# Patient Record
Sex: Male | Born: 1955 | Race: White | Hispanic: No | Marital: Married | Smoking: Never smoker
Health system: Southern US, Community
[De-identification: ages and names within clinical notes are randomized; demographics above are authoritative.]

## PROBLEM LIST (undated history)

## (undated) DIAGNOSIS — B269 Mumps without complication: Secondary | ICD-10-CM

## (undated) DIAGNOSIS — I499 Cardiac arrhythmia, unspecified: Secondary | ICD-10-CM

## (undated) DIAGNOSIS — Q248 Other specified congenital malformations of heart: Secondary | ICD-10-CM

## (undated) DIAGNOSIS — K219 Gastro-esophageal reflux disease without esophagitis: Secondary | ICD-10-CM

## (undated) DIAGNOSIS — T7840XA Allergy, unspecified, initial encounter: Secondary | ICD-10-CM

## (undated) DIAGNOSIS — I471 Supraventricular tachycardia, unspecified: Secondary | ICD-10-CM

## (undated) DIAGNOSIS — K5730 Diverticulosis of large intestine without perforation or abscess without bleeding: Secondary | ICD-10-CM

## (undated) DIAGNOSIS — J309 Allergic rhinitis, unspecified: Secondary | ICD-10-CM

## (undated) DIAGNOSIS — B059 Measles without complication: Secondary | ICD-10-CM

## (undated) HISTORY — DX: Other specified congenital malformations of heart: Q24.8

## (undated) HISTORY — DX: Supraventricular tachycardia, unspecified: I47.10

## (undated) HISTORY — DX: Allergy, unspecified, initial encounter: T78.40XA

## (undated) HISTORY — PX: OTHER SURGICAL HISTORY: SHX169

## (undated) HISTORY — PX: TONSILLECTOMY: SUR1361

## (undated) HISTORY — DX: Cardiac arrhythmia, unspecified: I49.9

## (undated) HISTORY — PX: HERNIA REPAIR: SHX51

## (undated) HISTORY — DX: Mumps without complication: B26.9

## (undated) HISTORY — DX: Diverticulosis of large intestine without perforation or abscess without bleeding: K57.30

## (undated) HISTORY — DX: Measles without complication: B05.9

## (undated) HISTORY — DX: Allergic rhinitis, unspecified: J30.9

## (undated) HISTORY — DX: Supraventricular tachycardia: I47.1

## (undated) HISTORY — DX: Gastro-esophageal reflux disease without esophagitis: K21.9

---

## 2004-05-01 HISTORY — PX: MITRAL VALVE REPAIR: SHX2039

## 2004-05-01 HISTORY — PX: CARDIAC CATHETERIZATION: SHX172

## 2012-06-03 ENCOUNTER — Encounter: Payer: Self-pay | Admitting: Cardiovascular Disease

## 2012-06-03 ENCOUNTER — Ambulatory Visit: Payer: Self-pay | Admitting: Cardiovascular Disease

## 2012-06-03 ENCOUNTER — Ambulatory Visit (INDEPENDENT_AMBULATORY_CARE_PROVIDER_SITE_OTHER): Payer: 59 | Admitting: Cardiovascular Disease

## 2012-06-03 VITALS — BP 122/86 | HR 77 | Ht 70.0 in | Wt 192.5 lb

## 2012-06-03 DIAGNOSIS — R0602 Shortness of breath: Secondary | ICD-10-CM

## 2012-06-03 DIAGNOSIS — R002 Palpitations: Secondary | ICD-10-CM | POA: Insufficient documentation

## 2012-06-03 DIAGNOSIS — Z9889 Other specified postprocedural states: Secondary | ICD-10-CM

## 2012-06-03 DIAGNOSIS — R42 Dizziness and giddiness: Secondary | ICD-10-CM

## 2012-06-03 MED ORDER — FLECAINIDE ACETATE 50 MG PO TABS: 50.0000 mg | ORAL_TABLET | Freq: Two times a day (BID) | ORAL | Status: DC

## 2012-06-03 NOTE — Progress Notes (Signed)
Patient ID: Austin Stewart, male    DOB: November 30, 1955, 57 y.o.   MRN: [id number]  HPI Comments: Austin Stewart is a very 57 year old who works at the [employer], patient of Dr. Juanetta Gosling, history of significant mitral valve regurgitation (torn leaflet? By his report) with associated shortness of breath, mitral valve repair in 2006 at [facility] with maze procedure at that time, shoulder surgery March 2013 canceled secondary to arrhythmia, likely PACs with followup echocardiogram and stress test showing no ischemia by report, shoulder surgery April 2013 who presents to establish care.  He reports that he is moved to the area in October 2013. He overall feels well. He was very active at baseline prior to moving here and was exercising on a frequent basis. More recently, has had significant weight gain and not exercising. He does report having some fluttering in his chest at times, approximately every other day. This comes on as several extra beats and does not last for long. He reports having echocardiogram in 2013 which showed well functioning mitral valve. He also had a Holter monitor in 2013. He does not know what that showed.   He reports her cholesterol has not been a problem. No significant family history of coronary artery disease. Nonsmoker. Know coronary artery disease seen on previous cardiac catheterization in 2006 He was started on flecainide 50 mg twice a day for ectopy in 2013.  EKG shows normal sinus rhythm with rate 77 beats per minute, no significant ST or T wave changes   Outpatient Encounter Prescriptions as of 06/03/2012  Medication Sig Dispense Refill  . aspirin 81 MG tablet Take 81 mg by mouth daily.      . flecainide (TAMBOCOR) 50 MG tablet Take 1 tablet (50 mg total) by mouth 2 (two) times daily.  60 tablet  11  . fluticasone (FLONASE) 50 MCG/ACT nasal spray Place 2 sprays into the nose daily.      . Glucosamine-Chondroit-Vit C-Mn (GLUCOSAMINE CHONDR 500 COMPLEX PO) Take 500  mg by mouth daily.      Marland Kitchen loratadine (CLARITIN) 10 MG tablet Take 10 mg by mouth daily.      . montelukast (SINGULAIR) 10 MG tablet Take 10 mg by mouth at bedtime.      Marland Kitchen omeprazole (PRILOSEC) 20 MG capsule Take 20 mg by mouth daily.      . Tamsulosin HCl (FLOMAX) 0.4 MG CAPS Take by mouth.      . [DISCONTINUED] flecainide (TAMBOCOR) 50 MG tablet Take 50 mg by mouth 2 (two) times daily.         Review of Systems  Constitutional: Negative.   HENT: Negative.   Eyes: Negative.   Respiratory: Negative.   Cardiovascular: Positive for palpitations.  Gastrointestinal: Negative.   Musculoskeletal: Negative.   Skin: Negative.   Neurological: Negative.   Hematological: Negative.   Psychiatric/Behavioral: Negative.   All other systems reviewed and are negative.     BP 122/86  Pulse 77  Ht 5\' 10"  (1.778 m)  Wt 192 lb 8 oz (87.317 kg)  BMI 27.62 kg/m2  Physical Exam  Nursing note and vitals reviewed. Constitutional: He is oriented to person, place, and time. He appears well-developed and well-nourished.  HENT:  Head: Normocephalic.  Nose: Nose normal.  Mouth/Throat: Oropharynx is clear and moist.  Eyes: Conjunctivae normal are normal. Pupils are equal, round, and reactive to light.  Neck: Normal range of motion. Neck supple. No JVD present.  Cardiovascular: Normal rate, regular rhythm, S1 normal, S2  normal, normal heart sounds and intact distal pulses.  Exam reveals no gallop and no friction rub.   No murmur heard. Pulmonary/Chest: Effort normal and breath sounds normal. No respiratory distress. He has no wheezes. He has no rales. He exhibits no tenderness.  Abdominal: Soft. Bowel sounds are normal. He exhibits no distension. There is no tenderness.  Musculoskeletal: Normal range of motion. He exhibits no edema and no tenderness.  Lymphadenopathy:    He has no cervical adenopathy.  Neurological: He is alert and oriented to person, place, and time. Coordination normal.  Skin: Skin  is warm and dry. No rash noted. No erythema.  Psychiatric: He has a normal mood and affect. His behavior is normal. Judgment and thought content normal.           Assessment and Plan

## 2012-06-03 NOTE — Assessment & Plan Note (Signed)
He reports having episodes where his head will drop it he quickly recovers, usually when in a sitting position such as at church. Denies palpitations during these episodes. He is usually not tired when this happens. Etiology is uncertain. Could certainly do a Holter monitor if symptoms continue to happen or get worse.

## 2012-06-03 NOTE — Assessment & Plan Note (Signed)
History of repair in 2006 with maze procedure. Recent echocardiogram in 2013

## 2012-06-03 NOTE — Patient Instructions (Addendum)
You are doing well. No medication changes were made.  Please call us if you have new issues that need to be addressed before your next appt.  Your physician wants you to follow-up in: 12 months.  You will receive a reminder letter in the mail two months in advance. If you don't receive a letter, please call our office to schedule the follow-up appointment. 

## 2012-06-03 NOTE — Assessment & Plan Note (Signed)
By symptoms, it sounds like he is having APCs or PVCs. Less likely atrial fibrillation. He was started previously on flecainide for ectopy. Symptoms are mild, usually at rest. He did suggest he could take extra flecainide as needed for worsening symptoms. Other option would be to start low-dose beta blocker such as metoprolol tartrate. He will call us if he would like to add additional medication such as beta blocker. He will also call if he would like to repeat his Holter. We'll try to obtain the results from 2013.

## 2012-07-12 ENCOUNTER — Other Ambulatory Visit: Payer: Self-pay

## 2012-07-12 MED ORDER — FLECAINIDE ACETATE 50 MG PO TABS: 50.0000 mg | ORAL_TABLET | Freq: Two times a day (BID) | ORAL | Status: DC

## 2012-07-12 NOTE — Telephone Encounter (Signed)
Refilled Flecainide sent to [facility].

## 2012-11-19 ENCOUNTER — Other Ambulatory Visit: Payer: Self-pay | Admitting: Cardiovascular Disease

## 2012-11-20 ENCOUNTER — Other Ambulatory Visit: Payer: Self-pay | Admitting: *Deleted

## 2012-11-20 MED ORDER — FLECAINIDE ACETATE 50 MG PO TABS: 50.0000 mg | ORAL_TABLET | Freq: Two times a day (BID) | ORAL | Status: DC

## 2012-11-20 NOTE — Telephone Encounter (Signed)
Refilled Flecainide sent to [facility].

## 2013-03-21 ENCOUNTER — Other Ambulatory Visit: Payer: Self-pay

## 2013-03-21 MED ORDER — FLECAINIDE ACETATE 50 MG PO TABS: 50.0000 mg | ORAL_TABLET | Freq: Two times a day (BID) | ORAL | Status: DC

## 2013-03-21 NOTE — Telephone Encounter (Signed)
Refill sent for flecainide 50 mg take one tablet twice a day.

## 2013-06-09 ENCOUNTER — Encounter: Payer: Self-pay | Admitting: Cardiovascular Disease

## 2013-06-09 ENCOUNTER — Ambulatory Visit (INDEPENDENT_AMBULATORY_CARE_PROVIDER_SITE_OTHER): Payer: 59 | Admitting: Cardiovascular Disease

## 2013-06-09 VITALS — BP 130/90 | HR 75 | Ht 70.0 in | Wt 196.8 lb

## 2013-06-09 DIAGNOSIS — Z9889 Other specified postprocedural states: Secondary | ICD-10-CM

## 2013-06-09 DIAGNOSIS — R079 Chest pain, unspecified: Secondary | ICD-10-CM

## 2013-06-09 DIAGNOSIS — R0602 Shortness of breath: Secondary | ICD-10-CM | POA: Insufficient documentation

## 2013-06-09 DIAGNOSIS — R002 Palpitations: Secondary | ICD-10-CM

## 2013-06-09 MED ORDER — FLECAINIDE ACETATE 50 MG PO TABS: 50.0000 mg | ORAL_TABLET | Freq: Two times a day (BID) | ORAL | Status: DC

## 2013-06-09 MED ORDER — FUROSEMIDE 20 MG PO TABS: 20.0000 mg | ORAL_TABLET | Freq: Every day | ORAL | Status: DC | PRN

## 2013-06-09 NOTE — Patient Instructions (Addendum)
You are doing well. Please take lasix as needed for shortness of breath or weight gain Take with potassium  Please call us if you have new issues that need to be addressed before your next appt.  Your physician wants you to follow-up in: 12 months.  You will receive a reminder letter in the mail two months in advance. If you don't receive a letter, please call our office to schedule the follow-up appointment.

## 2013-06-09 NOTE — Assessment & Plan Note (Signed)
No clear etiology of his shortness of breath. Symptoms seem to come on at rest, not with exertion. Unable to exclude mild fluid overload, diastolic dysfunction/CHF. Suggested he could try Lasix when necessary for symptoms. If symptoms get worse, suggested he call our office. Echocardiogram could be repeated to evaluate his mitral valve

## 2013-06-09 NOTE — Assessment & Plan Note (Signed)
Clinical exam is essentially benign. No clinical signs of heart failure despite mild shortness of breath symptoms. Recent echocardiogram 2013 was essentially normal.

## 2013-06-09 NOTE — Progress Notes (Signed)
Patient ID: Austin Stewart, male    DOB: 1955-05-06, 58 y.o.   MRN: [id number]  HPI Comments: Austin Stewart is a very 58 year old who works at the [employer], patient of Dr. Juanetta Gosling, history of significant mitral valve regurgitation (torn leaflet? By his report) with associated shortness of breath, mitral valve repair in 2006 at [facility] with maze procedure at that time, shoulder surgery March 2013 canceled secondary to arrhythmia, likely PACs  with followup echocardiogram and stress test showing no ischemia by report, shoulder surgery April 2013 who presents for routine followup. He is taking flecainide presumably for ectopy.  In general he reports that he is doing well. He does report having mild increasing shortness of breath. Weight has been trending higher, he is not exercising. Denies any lower extremity edema, abdominal swelling, no PND or orthopnea. Symptoms of shortness of breath seem to happen when he is still such as sitting. Able to climb 5 flights of stairs without significant shortness of breath at work.  echocardiogram in 2013 which showed well functioning mitral valve.   Holter monitor in 2013. He does not know what that showed.    cholesterol has not been a problem for the patient. No significant family history of coronary artery disease. Nonsmoker. no coronary artery disease seen on previous cardiac catheterization in 2006 He was started on flecainide 50 mg twice a day for ectopy in 2013.  EKG shows normal sinus rhythm with rate 75 beats per minute, no significant ST or T wave changes   Outpatient Encounter Prescriptions as of 06/09/2013  Medication Sig  . aspirin 81 MG tablet Take 81 mg by mouth daily.  . flecainide (TAMBOCOR) 50 MG tablet Take 1 tablet (50 mg total) by mouth 2 (two) times daily.  . fluticasone (FLONASE) 50 MCG/ACT nasal spray Place 2 sprays into the nose daily.  . Glucosamine-Chondroit-Vit C-Mn (GLUCOSAMINE CHONDR 500 COMPLEX PO) Take 500 mg by  mouth daily.  Marland Kitchen loratadine (CLARITIN) 10 MG tablet Take 10 mg by mouth daily.  . montelukast (SINGULAIR) 10 MG tablet Take 10 mg by mouth at bedtime.  Marland Kitchen omeprazole (PRILOSEC) 20 MG capsule Take 20 mg by mouth daily.  . Tamsulosin HCl (FLOMAX) 0.4 MG CAPS Take by mouth.     Review of Systems  Constitutional: Negative.   HENT: Negative.   Eyes: Negative.   Respiratory: Positive for shortness of breath.   Cardiovascular: Negative.   Gastrointestinal: Negative.   Endocrine: Negative.   Musculoskeletal: Negative.   Skin: Negative.   Allergic/Immunologic: Negative.   Neurological: Negative.   Hematological: Negative.   Psychiatric/Behavioral: Negative.   All other systems reviewed and are negative.    BP 130/90  Pulse 75  Ht 5\' 10"  (1.778 m)  Wt 196 lb 12 oz (89.245 kg)  BMI 28.23 kg/m2  Physical Exam  Nursing note and vitals reviewed. Constitutional: He is oriented to person, place, and time. He appears well-developed and well-nourished.  HENT:  Head: Normocephalic.  Nose: Nose normal.  Mouth/Throat: Oropharynx is clear and moist.  Eyes: Conjunctivae are normal. Pupils are equal, round, and reactive to light.  Neck: Normal range of motion. Neck supple. No JVD present.  Cardiovascular: Normal rate, regular rhythm, S1 normal, S2 normal, normal heart sounds and intact distal pulses.  Exam reveals no gallop and no friction rub.   No murmur heard. Pulmonary/Chest: Effort normal and breath sounds normal. No respiratory distress. He has no wheezes. He has no rales. He exhibits no tenderness.  Abdominal:  Soft. Bowel sounds are normal. He exhibits no distension. There is no tenderness.  Musculoskeletal: Normal range of motion. He exhibits no edema and no tenderness.  Lymphadenopathy:    He has no cervical adenopathy.  Neurological: He is alert and oriented to person, place, and time. Coordination normal.  Skin: Skin is warm and dry. No rash noted. No erythema.  Psychiatric: He has  a normal mood and affect. His behavior is normal. Judgment and thought content normal.      Assessment and Plan

## 2013-06-24 ENCOUNTER — Other Ambulatory Visit: Payer: Self-pay | Admitting: Cardiovascular Disease

## 2013-06-27 ENCOUNTER — Other Ambulatory Visit: Payer: Self-pay | Admitting: Cardiovascular Disease

## 2014-02-03 ENCOUNTER — Encounter: Payer: Self-pay | Admitting: Cardiovascular Disease

## 2014-02-04 ENCOUNTER — Other Ambulatory Visit: Payer: Self-pay

## 2014-02-04 MED ORDER — OMEPRAZOLE 20 MG PO CPDR: 20.0000 mg | DELAYED_RELEASE_CAPSULE | Freq: Every day | ORAL | Status: DC

## 2014-02-04 MED ORDER — FUROSEMIDE 20 MG PO TABS: 20.0000 mg | ORAL_TABLET | Freq: Every day | ORAL | Status: DC | PRN

## 2014-02-25 ENCOUNTER — Ambulatory Visit: Payer: Self-pay | Admitting: Physician Assistant

## 2014-05-01 LAB — HM DIABETES EYE EXAM

## 2014-06-09 ENCOUNTER — Encounter: Payer: Self-pay | Admitting: Cardiovascular Disease

## 2014-06-09 ENCOUNTER — Ambulatory Visit (INDEPENDENT_AMBULATORY_CARE_PROVIDER_SITE_OTHER): Payer: 59 | Admitting: Cardiovascular Disease

## 2014-06-09 VITALS — BP 120/72 | HR 76 | Ht 70.0 in | Wt 189.0 lb

## 2014-06-09 DIAGNOSIS — R002 Palpitations: Secondary | ICD-10-CM

## 2014-06-09 DIAGNOSIS — R0789 Other chest pain: Secondary | ICD-10-CM

## 2014-06-09 DIAGNOSIS — Z9889 Other specified postprocedural states: Secondary | ICD-10-CM

## 2014-06-09 DIAGNOSIS — I5032 Chronic diastolic (congestive) heart failure: Secondary | ICD-10-CM

## 2014-06-09 DIAGNOSIS — I5030 Unspecified diastolic (congestive) heart failure: Secondary | ICD-10-CM | POA: Insufficient documentation

## 2014-06-09 DIAGNOSIS — R0602 Shortness of breath: Secondary | ICD-10-CM

## 2014-06-09 MED ORDER — FLECAINIDE ACETATE 50 MG PO TABS: 50.0000 mg | ORAL_TABLET | Freq: Two times a day (BID) | ORAL | Status: DC

## 2014-06-09 MED ORDER — FUROSEMIDE 20 MG PO TABS: 20.0000 mg | ORAL_TABLET | Freq: Every day | ORAL | Status: DC | PRN

## 2014-06-09 NOTE — Assessment & Plan Note (Signed)
Last echocardiogram 2013. No murmur on today's visit. No clinical signs concerning for recurrent MR. He is happy to not do a repeat echocardiogram at this time as he feels well

## 2014-06-09 NOTE — Assessment & Plan Note (Signed)
He continues to have palpitations at rest 3 days per week lasting several minutes. We discussed whether he would like beta blockers on a regular basis such as metoprolol tartrate twice a day, or propranolol as needed. He could even increase the flecainide up to 75 mg twice a day, or take extra flecainide 50 mg  for days with more palpitations. His symptoms get worse, would repeat Holter monitor

## 2014-06-09 NOTE — Assessment & Plan Note (Signed)
Shortness of breath likely from diastolic dysfunction. He feels better on Lasix daily. He will have routine lab work with primary care to determine if he needs to be on potassium supplement

## 2014-06-09 NOTE — Assessment & Plan Note (Signed)
Symptoms improved with Lasix daily. Recommended routine screening basic metabolic panel when he sees primary care next month

## 2014-06-09 NOTE — Progress Notes (Signed)
Patient ID: DOHN STCLAIR, male    DOB: [birth year], 59 y.o.   MRN: [id number]  HPI Comments: Mr. Day is a very 59 year old who works at the [employer], patient of Dr. Juanetta Gosling, history of significant mitral valve regurgitation (torn leaflet? by his report) with associated shortness of breath, mitral valve repair in 2006 at [facility] with maze procedure at that time, shoulder surgery March 2013 canceled secondary to arrhythmia, likely PACs  with followup echocardiogram and stress test showing no ischemia by report, shoulder surgery April 2013 who presents for routine followup of his arrhythmia. He is taking flecainide presumably for ectopy. This was started by prior cardiologist.  In follow-up today, he reports that he is doing well in general. He continues to have episodes of palpitations 3 times per week each episode lasting for several minutes at a time. These typically present when he is at rest, not with exertion. He is otherwise active, recently started back at the gym. He's noticed some shortness of breath when he climbs stairs. He's been taking Lasix 20 mg daily. No recent lab work. No potassium supplementation. He feels that his breathing is better when he takes Lasix on a regular basis He feels that he has more palpitations when he has poor sleep.  EKG on today's visit shows normal sinus rhythm with rate 76 bpm, no significant ST or T-wave changes  Other past medical history echocardiogram in 2013 which showed well functioning mitral valve.   Holter monitor in 2013. He does not know what that showed.    cholesterol has not been a problem for the patient. No significant family history of coronary artery disease. Nonsmoker. no coronary artery disease seen on previous cardiac catheterization in 2006 He was started on flecainide 50 mg twice a day for ectopy in 2013.  No Known Allergies  Outpatient Encounter Prescriptions as of 06/09/2014  Medication Sig  . aspirin 81 MG  tablet Take 81 mg by mouth daily.  . flecainide (TAMBOCOR) 50 MG tablet Take 1 tablet (50 mg total) by mouth 2 (two) times daily.  . fluticasone (FLONASE) 50 MCG/ACT nasal spray Place 2 sprays into the nose daily.  . furosemide (LASIX) 20 MG tablet Take 1 tablet (20 mg total) by mouth daily as needed.  . Glucosamine-Chondroit-Vit C-Mn (GLUCOSAMINE CHONDR 500 COMPLEX PO) Take 500 mg by mouth daily.  Marland Kitchen loratadine (CLARITIN) 10 MG tablet Take 10 mg by mouth daily.  . montelukast (SINGULAIR) 10 MG tablet Take 10 mg by mouth at bedtime.  Marland Kitchen omeprazole (PRILOSEC) 20 MG capsule Take 1 capsule (20 mg total) by mouth daily.  . Tamsulosin HCl (FLOMAX) 0.4 MG CAPS Take by mouth.  . [DISCONTINUED] flecainide (TAMBOCOR) 50 MG tablet Take 1 tablet (50 mg total) by mouth 2 (two) times daily.  . [DISCONTINUED] furosemide (LASIX) 20 MG tablet Take 1 tablet (20 mg total) by mouth daily as needed.    Past Medical History  Diagnosis Date  . Allergic rhinitis, cause unspecified   . Diverticulosis of colon (without mention of hemorrhage)   . 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 Esophageal reflux   . Osteoarthrosis, unspecified whether generalized or localized, unspecified site   . Cardiac dysrhythmia, unspecified   . Measles without mention of complication   . Mumps without mention of complication     Past Surgical History  Procedure Laterality Date  .  Tonsillectomy    . Mitral valve repair    . Hernia repair    . Rotator cuff repair    . Cardiac catheterization  2006    [facility]     Social History  reports that he has never smoked. He does not have any smokeless tobacco history on file. He reports that he does not drink alcohol or use illicit drugs.  Family History family history includes Heart disease in his mother; Hypertension in his mother.   Review of Systems   Constitutional: Negative.   Respiratory: Positive for shortness of breath.   Cardiovascular: Negative.   Gastrointestinal: Negative.   Musculoskeletal: Negative.   Neurological: Negative.   Hematological: Negative.   Psychiatric/Behavioral: Negative.   All other systems reviewed and are negative.   BP 120/72 mmHg  Pulse 76  Ht 5\' 10"  (1.778 m)  Wt 189 lb (85.73 kg)  BMI 27.12 kg/m2  Physical Exam  Constitutional: He is oriented to person, place, and time. He appears well-developed and well-nourished.  HENT:  Head: Normocephalic.  Nose: Nose normal.  Mouth/Throat: Oropharynx is clear and moist.  Eyes: Conjunctivae are normal. Pupils are equal, round, and reactive to light.  Neck: Normal range of motion. Neck supple. No JVD present.  Cardiovascular: Normal rate, regular rhythm, S1 normal, S2 normal, normal heart sounds and intact distal pulses.  Exam reveals no gallop and no friction rub.   No murmur heard. Pulmonary/Chest: Effort normal and breath sounds normal. No respiratory distress. He has no wheezes. He has no rales. He exhibits no tenderness.  Abdominal: Soft. Bowel sounds are normal. He exhibits no distension. There is no tenderness.  Musculoskeletal: Normal range of motion. He exhibits no edema or tenderness.  Lymphadenopathy:    He has no cervical adenopathy.  Neurological: He is alert and oriented to person, place, and time. Coordination normal.  Skin: Skin is warm and dry. No rash noted. No erythema.  Psychiatric: He has a normal mood and affect. His behavior is normal. Judgment and thought content normal.      Assessment and Plan   Nursing note and vitals reviewed.

## 2014-06-09 NOTE — Patient Instructions (Signed)
You are doing well. No medication changes were made.  Please call us if you have new issues that need to be addressed before your next appt.  Your physician wants you to follow-up in: 12 months.  You will receive a reminder letter in the mail two months in advance. If you don't receive a letter, please call our office to schedule the follow-up appointment. 

## 2014-10-20 ENCOUNTER — Other Ambulatory Visit: Payer: Self-pay | Admitting: Family Medicine

## 2014-10-20 ENCOUNTER — Telehealth: Payer: Self-pay | Admitting: Family Medicine

## 2014-10-20 MED ORDER — TAMSULOSIN HCL 0.4 MG PO CAPS: 0.4000 mg | ORAL_CAPSULE | Freq: Every day | ORAL | Status: DC

## 2014-10-20 NOTE — Telephone Encounter (Signed)
This is not Flonase,  This is Flomax tablets, 0.4 mg tablets, 1 each day.  He can have 30 with 12 refills-jh

## 2014-10-20 NOTE — Telephone Encounter (Signed)
Pt needed Rx for Flomax do you want me to Rx this wants 90 days supply.

## 2014-10-20 NOTE — Telephone Encounter (Signed)
Rx send for 90 days with 3 refill.

## 2014-10-20 NOTE — Telephone Encounter (Signed)
Pt called requesting a refill on  Flomax( Flonase??) for 90 day supply [facility] pt.call back  # is [phone]. Thanks

## 2014-12-07 ENCOUNTER — Encounter: Payer: Self-pay | Admitting: Family Medicine

## 2014-12-07 ENCOUNTER — Ambulatory Visit (INDEPENDENT_AMBULATORY_CARE_PROVIDER_SITE_OTHER): Payer: 59 | Admitting: Family Medicine

## 2014-12-07 VITALS — BP 124/86 | HR 85 | Temp 98.1°F | Resp 16 | Ht 68.0 in | Wt 188.6 lb

## 2014-12-07 DIAGNOSIS — I5030 Unspecified diastolic (congestive) heart failure: Secondary | ICD-10-CM | POA: Diagnosis not present

## 2014-12-07 DIAGNOSIS — Z9889 Other specified postprocedural states: Secondary | ICD-10-CM

## 2014-12-07 DIAGNOSIS — K219 Gastro-esophageal reflux disease without esophagitis: Secondary | ICD-10-CM | POA: Diagnosis not present

## 2014-12-07 DIAGNOSIS — M12849 Other specific arthropathies, not elsewhere classified, unspecified hand: Secondary | ICD-10-CM | POA: Diagnosis not present

## 2014-12-07 DIAGNOSIS — R002 Palpitations: Secondary | ICD-10-CM

## 2014-12-07 DIAGNOSIS — J302 Other seasonal allergic rhinitis: Secondary | ICD-10-CM

## 2014-12-07 DIAGNOSIS — N400 Enlarged prostate without lower urinary tract symptoms: Secondary | ICD-10-CM

## 2014-12-07 DIAGNOSIS — Z0000 Encounter for general adult medical examination without abnormal findings: Secondary | ICD-10-CM | POA: Diagnosis not present

## 2014-12-07 MED ORDER — OMEPRAZOLE 20 MG PO CPDR: 20.0000 mg | DELAYED_RELEASE_CAPSULE | Freq: Every day | ORAL | Status: DC

## 2014-12-07 MED ORDER — MONTELUKAST SODIUM 10 MG PO TABS: 10.0000 mg | ORAL_TABLET | Freq: Every day | ORAL | Status: DC

## 2014-12-07 MED ORDER — FLUTICASONE PROPIONATE 50 MCG/ACT NA SUSP: 2.0000 | Freq: Every day | NASAL | Status: DC

## 2014-12-07 MED ORDER — TAMSULOSIN HCL 0.4 MG PO CAPS: 0.4000 mg | ORAL_CAPSULE | Freq: Every day | ORAL | Status: DC

## 2014-12-07 NOTE — Progress Notes (Signed)
. Name: Austin Stewart   MRN: [id number]    DOB: [birth year]   Date:12/07/2014       Progress Note  Subjective  Chief Complaint  Chief Complaint  Patient presents with  . Annual Exam    HPI  Here for yearly CP.  C/o bilateral MC-C joint pain of 1st fingers.  Trouble picking up a glass at times now.  Some weakness of hands.   Has CHF. With arrhythmias.  Sees Dr. Mariah Milling yearly.  No problem-specific assessment & plan notes found for this encounter.   Past Medical History  Diagnosis Date  . Allergic rhinitis, cause unspecified   . Diverticulosis of colon (without mention of hemorrhage)   . 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 Esophageal reflux   . Osteoarthrosis, unspecified whether generalized or localized, unspecified site   . Cardiac dysrhythmia, unspecified   . Measles without mention of complication   . Mumps without mention of complication   . Allergy     Past Surgical History  Procedure Laterality Date  . Tonsillectomy    . Mitral valve repair    . Hernia repair    . Rotator cuff repair    . Cardiac catheterization  2006    [facility]     Family History  Problem Relation Age of Onset  . Heart disease Mother     s/p stent placement  . Hypertension Mother   . Emphysema Father   . Cancer Maternal Uncle     kidney cancer    History   Social History  . Marital Status: Married    Spouse Name: N/A  . Number of Children: N/A  . Years of Education: N/A   Occupational History  . Not on file.   Social History Main Topics  . Smoking status: Never Smoker   . Smokeless tobacco: Not on file  . Alcohol Use: No  . Drug Use: No  . Sexual Activity: Not on file   Other Topics Concern  . Not on file   Social History Narrative     Current outpatient prescriptions:  .  aspirin 81 MG tablet, Take 81 mg by mouth daily., Disp: , Rfl:   .  flecainide (TAMBOCOR) 50 MG tablet, Take 1 tablet (50 mg total) by mouth 2 (two) times daily., Disp: 180 tablet, Rfl: 3 .  fluticasone (FLONASE) 50 MCG/ACT nasal spray, Place 2 sprays into the nose daily., Disp: , Rfl:  .  furosemide (LASIX) 20 MG tablet, Take 1 tablet (20 mg total) by mouth daily as needed., Disp: 90 tablet, Rfl: 3 .  Glucosamine-Chondroit-Vit C-Mn (GLUCOSAMINE CHONDR 500 COMPLEX PO), Take 500 mg by mouth daily., Disp: , Rfl:  .  loratadine (CLARITIN) 10 MG tablet, Take 10 mg by mouth daily., Disp: , Rfl:  .  montelukast (SINGULAIR) 10 MG tablet, Take 10 mg by mouth at bedtime., Disp: , Rfl:  .  omeprazole (PRILOSEC) 20 MG capsule, Take 1 capsule (20 mg total) by mouth daily., Disp: 90 capsule, Rfl: 3 .  tamsulosin (FLOMAX) 0.4 MG CAPS capsule, Take 1 capsule (0.4 mg total) by mouth daily., Disp: 90 capsule, Rfl: 3  No Known Allergies   Review of Systems  Constitutional: Negative for fever, chills, weight loss and malaise/fatigue.  HENT: Negative for hearing loss.   Eyes: Negative for blurred vision and double vision.  Respiratory: Negative for cough, sputum  production, shortness of breath and wheezing.   Cardiovascular: Negative for chest pain, palpitations and leg swelling.  Gastrointestinal: Negative for heartburn, nausea, vomiting, abdominal pain, diarrhea and blood in stool.  Genitourinary: Negative for dysuria, urgency and frequency.  Musculoskeletal: Positive for joint pain (bilateral first finger MC-C joint pain). Negative for myalgias.  Skin: Negative for rash.       Skin tag L. Inner thigh  Neurological: Negative for dizziness, sensory change, focal weakness, weakness and headaches.  Psychiatric/Behavioral: Negative for depression. The patient is not nervous/anxious.       Objective  Filed Vitals:   12/07/14 1414  BP: 124/86  Pulse: 85  Temp: 98.1 F (36.7 C)  TempSrc: Oral  Resp: 16  Height: 5\' 8"  (1.727 m)  Weight: 188 lb 9.6 oz (85.548 kg)     Physical Exam  Constitutional: He is oriented to person, place, and time and well-developed, well-nourished, and in no distress. No distress.  HENT:  Head: Normocephalic and atraumatic.  Right Ear: External ear normal.  Left Ear: External ear normal.  Nose: Nose normal.  Mouth/Throat: Oropharynx is clear and moist.  Eyes: Conjunctivae and EOM are normal. Pupils are equal, round, and reactive to light. No scleral icterus.  Neck: Normal range of motion. Neck supple. No thyromegaly present.  Cardiovascular: Normal rate, regular rhythm, normal heart sounds and intact distal pulses.  Exam reveals no gallop and no friction rub.   No murmur heard. Pulmonary/Chest: Effort normal and breath sounds normal. No respiratory distress. He has no wheezes. He has no rales.  Abdominal: Soft. Bowel sounds are normal. He exhibits no distension and no mass. There is no tenderness.  Genitourinary: Rectum normal and penis normal. Prostate is enlarged. Prostate is not tender.  No nodules  Musculoskeletal:  L. Shoulder with some crepitus.  Bilateral MC-C joint pain to palp and ROM of 1st finger.  Lymphadenopathy:    He has no cervical adenopathy.  Neurological: He is alert and oriented to person, place, and time. No cranial nerve deficit. Coordination normal.  Skin: Skin is warm and dry.  Large skin tags on stalks (#2) of L. inner thigh.  Psychiatric: Mood, memory, affect and judgment normal.  Vitals reviewed.      No results found for this or any previous visit (from the past 2160 hour(s)).   Assessment & Plan  Problem List Items Addressed This Visit      Digestive   GERD without esophagitis - Primary     Genitourinary   BPH (benign prostatic hyperplasia)      No orders of the defined types were placed in this encounter.   1. GERD without esophagitis  - CBC with Differential - omeprazole (PRILOSEC) 20 MG capsule; Take 1 capsule (20 mg total) by mouth daily.  Dispense: 90 capsule;  Refill: 3  2. BPH (benign prostatic hyperplasia)  - PSA - tamsulosin (FLOMAX) 0.4 MG CAPS capsule; Take 1 capsule (0.4 mg total) by mouth daily.  Dispense: 90 capsule; Refill: 3  3. Annual physical exam  - Comprehensive Metabolic Panel (CMET) - Lipid Profile  4. Arthritis, episodic, of hand, unspecified laterality  - Ambulatory referral to Orthopedic Surgery  5. H/O mitral valve repair   6. Heart palpitations - eep f/u with Card. (Dr. Mariah Milling).  7. Diastolic congestive heart failure, unspecified congestive heart failure chronicity  - Comprehensive Metabolic Panel (CMET) - Lipid Profile  8. Seasonal allergies  - montelukast (SINGULAIR) 10 MG tablet; Take 1 tablet (10 mg total) by mouth  at bedtime.  Dispense: 90 tablet; Refill: 3 - fluticasone (FLONASE) 50 MCG/ACT nasal spray; Place 2 sprays into both nostrils daily. Dispense 3 bottles.  Dispense: 16 g; Refill: 3

## 2014-12-18 LAB — CBC WITH DIFFERENTIAL/PLATELET
BASOS: 1 %
Basophils Absolute: 0 10*3/uL (ref 0.0–0.2)
EOS (ABSOLUTE): 0.3 10*3/uL (ref 0.0–0.4)
Eos: 4 %
Hematocrit: 43.3 % (ref 37.5–51.0)
Hemoglobin: 15.6 g/dL (ref 12.6–17.7)
IMMATURE GRANS (ABS): 0 10*3/uL (ref 0.0–0.1)
Immature Granulocytes: 0 %
LYMPHS ABS: 1.4 10*3/uL (ref 0.7–3.1)
LYMPHS: 21 %
MCH: 30.6 pg (ref 26.6–33.0)
MCHC: 36 g/dL — AB (ref 31.5–35.7)
MCV: 85 fL (ref 79–97)
MONOS ABS: 0.5 10*3/uL (ref 0.1–0.9)
Monocytes: 8 %
NEUTROS ABS: 4.5 10*3/uL (ref 1.4–7.0)
Neutrophils: 66 %
PLATELETS: 350 10*3/uL (ref 150–379)
RBC: 5.1 x10E6/uL (ref 4.14–5.80)
RDW: 14.3 % (ref 12.3–15.4)
WBC: 6.8 10*3/uL (ref 3.4–10.8)

## 2014-12-19 LAB — COMPREHENSIVE METABOLIC PANEL
ALK PHOS: 89 IU/L (ref 39–117)
ALT: 30 IU/L (ref 0–44)
AST: 24 IU/L (ref 0–40)
Albumin/Globulin Ratio: 2.6 — ABNORMAL HIGH (ref 1.1–2.5)
Albumin: 4.6 g/dL (ref 3.6–4.8)
BUN/Creatinine Ratio: 13 (ref 10–22)
BUN: 14 mg/dL (ref 8–27)
Bilirubin Total: 0.7 mg/dL (ref 0.0–1.2)
CO2: 22 mmol/L (ref 18–29)
Calcium: 9.9 mg/dL (ref 8.6–10.2)
Chloride: 102 mmol/L (ref 97–108)
Creatinine, Ser: 1.07 mg/dL (ref 0.76–1.27)
GFR calc Af Amer: 86 mL/min/{1.73_m2} (ref >59)
GFR calc non Af Amer: 75 mL/min/{1.73_m2} (ref >59)
GLUCOSE: 105 mg/dL — AB (ref 65–99)
Globulin, Total: 1.8 g/dL (ref 1.5–4.5)
Potassium: 4.6 mmol/L (ref 3.5–5.2)
Sodium: 141 mmol/L (ref 134–144)
Total Protein: 6.4 g/dL (ref 6.0–8.5)

## 2014-12-19 LAB — LIPID PANEL
CHOL/HDL RATIO: 3.1 ratio (ref 0.0–5.0)
CHOLESTEROL TOTAL: 168 mg/dL (ref 100–199)
HDL: 54 mg/dL (ref >39)
LDL Calculated: 93 mg/dL (ref 0–99)
TRIGLYCERIDES: 104 mg/dL (ref 0–149)
VLDL CHOLESTEROL CAL: 21 mg/dL (ref 5–40)

## 2014-12-19 LAB — PSA: Prostate Specific Ag, Serum: 0.4 ng/mL (ref 0.0–4.0)

## 2014-12-25 LAB — HGB A1C W/O EAG: HEMOGLOBIN A1C: 5.7 % — AB (ref 4.8–5.6)

## 2015-01-05 ENCOUNTER — Encounter: Payer: Self-pay | Admitting: Family Medicine

## 2015-01-05 ENCOUNTER — Ambulatory Visit (INDEPENDENT_AMBULATORY_CARE_PROVIDER_SITE_OTHER): Payer: 59 | Admitting: Family Medicine

## 2015-01-05 VITALS — BP 138/88 | HR 73 | Temp 97.9°F | Resp 16 | Ht 68.0 in | Wt 191.0 lb

## 2015-01-05 DIAGNOSIS — L918 Other hypertrophic disorders of the skin: Secondary | ICD-10-CM

## 2015-01-05 MED ORDER — LIDOCAINE HCL 1 % IJ SOLN
2.0000 mL | Freq: Once | INTRAMUSCULAR | Status: AC
  Administered 2015-01-05: 2 mL via INTRADERMAL

## 2015-01-05 NOTE — Progress Notes (Signed)
Name: Austin Stewart   MRN: [id number]    DOB: [birth year]   Date:01/05/2015       Progress Note  Subjective  Chief Complaint  Chief Complaint  Patient presents with  . skin tag    upper left leg (inner)    HPI Here for removal of 2 pedunculated skin tags from L./ upper thigh that catch on clothes and get irritated.  No problem-specific assessment & plan notes found for this encounter.   Past Medical History  Diagnosis Date  . Allergic rhinitis, cause unspecified   . Diverticulosis of colon (without mention of hemorrhage)   . 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 Esophageal reflux   . Osteoarthrosis, unspecified whether generalized or localized, unspecified site   . Cardiac dysrhythmia, unspecified   . Measles without mention of complication   . Mumps without mention of complication   . Allergy     Social History  Substance Use Topics  . Smoking status: Never Smoker   . Smokeless tobacco: Never Used  . Alcohol Use: No     Current outpatient prescriptions:  .  aspirin 81 MG tablet, Take 81 mg by mouth daily., Disp: , Rfl:  .  flecainide (TAMBOCOR) 50 MG tablet, Take 1 tablet (50 mg total) by mouth 2 (two) times daily., Disp: 180 tablet, Rfl: 3 .  fluticasone (FLONASE) 50 MCG/ACT nasal spray, Place 2 sprays into both nostrils daily. Dispense 3 bottles., Disp: 16 g, Rfl: 3 .  furosemide (LASIX) 20 MG tablet, Take 1 tablet (20 mg total) by mouth daily as needed., Disp: 90 tablet, Rfl: 3 .  Glucosamine-Chondroit-Vit C-Mn (GLUCOSAMINE CHONDR 500 COMPLEX PO), Take 500 mg by mouth daily., Disp: , Rfl:  .  loratadine (CLARITIN) 10 MG tablet, Take 10 mg by mouth daily., Disp: , Rfl:  .  montelukast (SINGULAIR) 10 MG tablet, Take 1 tablet (10 mg total) by mouth at bedtime., Disp: 90 tablet, Rfl: 3 .  omeprazole (PRILOSEC) 20 MG capsule, Take 1 capsule  (20 mg total) by mouth daily., Disp: 90 capsule, Rfl: 3 .  tamsulosin (FLOMAX) 0.4 MG CAPS capsule, Take 1 capsule (0.4 mg total) by mouth daily., Disp: 90 capsule, Rfl: 3  No Known Allergies  Review of Systems  Constitutional: Negative for fever and chills.  Respiratory: Negative for shortness of breath and wheezing.   Cardiovascular: Negative for chest pain and palpitations.  Gastrointestinal: Negative for abdominal pain.  Genitourinary: Negative for dysuria, urgency and frequency.  Skin: Negative for rash.      Objective  Filed Vitals:   01/05/15 1533  BP: 138/88  Pulse: 73  Temp: 97.9 F (36.6 C)  TempSrc: Oral  Resp: 16  Height:  (1.727 m)  Weight: 191 lb (86.637 kg)     Physical Exam  Constitutional: He is well-developed, well-nourished, and in no distress. No distress.  HENT:  Head: Normocephalic.  Skin:  2, large pedunculated skin tags close together on L. Upper inner thigh. Anestesized with 1% xylocaine and removed by iris scissors.  Bleeding controlled with AgNo3 stick  Vitals reviewed.     Recent Results (from the past 2160 hour(s))  Comprehensive Metabolic Panel (CMET)     Status: Abnormal   Collection Time: 12/18/14  7:23 AM  Result Value Ref Range   Glucose 105 (H) 65 - 99 mg/dL   BUN 14 8 - 27 mg/dL  Creatinine, Ser 1.07 0.76 - 1.27 mg/dL   GFR calc non Af Amer 75 >59 mL/min/1.73   GFR calc Af Amer 86 >59 mL/min/1.73   BUN/Creatinine Ratio 13 10 - 22   Sodium 141 134 - 144 mmol/L   Potassium 4.6 3.5 - 5.2 mmol/L   Chloride 102 97 - 108 mmol/L   CO2 22 18 - 29 mmol/L   Calcium 9.9 8.6 - 10.2 mg/dL   Total Protein 6.4 6.0 - 8.5 g/dL   Albumin 4.6 3.6 - 4.8 g/dL   Globulin, Total 1.8 1.5 - 4.5 g/dL   Albumin/Globulin Ratio 2.6 (H) 1.1 - 2.5   Bilirubin Total 0.7 0.0 - 1.2 mg/dL   Alkaline Phosphatase 89 39 - 117 IU/L   AST 24 0 - 40 IU/L   ALT 30 0 - 44 IU/L  Lipid Profile     Status: None   Collection Time: 12/18/14  7:23 AM   Result Value Ref Range   Cholesterol, Total 168 100 - 199 mg/dL   Triglycerides 782 0 - 149 mg/dL   HDL 54 >95 mg/dL    Comment: According to ATP-III Guidelines, HDL-C >59 mg/dL is considered a negative risk factor for CHD.    VLDL Cholesterol Cal 21 5 - 40 mg/dL   LDL Calculated 93 0 - 99 mg/dL   Chol/HDL Ratio 3.1 0.0 - 5.0 ratio units    Comment:                                   T. Chol/HDL Ratio                                             Men  Women                               1/2 Avg.Risk  3.4    3.3                                   Avg.Risk  5.0    4.4                                2X Avg.Risk  9.6    7.1                                3X Avg.Risk 23.4   11.0   CBC with Differential     Status: Abnormal   Collection Time: 12/18/14  7:23 AM  Result Value Ref Range   WBC 6.8 3.4 - 10.8 x10E3/uL   RBC 5.10 4.14 - 5.80 x10E6/uL   Hemoglobin 15.6 12.6 - 17.7 g/dL   Hematocrit 62.1 30.8 - 51.0 %   MCV 85 79 - 97 fL   MCH 30.6 26.6 - 33.0 pg   MCHC 36.0 (H) 31.5 - 35.7 g/dL   RDW 65.7 84.6 - 96.2 %   Platelets 350 150 - 379 x10E3/uL   Neutrophils 66 %   Lymphs 21 %   Monocytes 8 %   Eos 4 %  Basos 1 %   Neutrophils Absolute 4.5 1.4 - 7.0 x10E3/uL   Lymphocytes Absolute 1.4 0.7 - 3.1 x10E3/uL   Monocytes Absolute 0.5 0.1 - 0.9 x10E3/uL   EOS (ABSOLUTE) 0.3 0.0 - 0.4 x10E3/uL   Basophils Absolute 0.0 0.0 - 0.2 x10E3/uL   Immature Granulocytes 0 %   Immature Grans (Abs) 0.0 0.0 - 0.1 x10E3/uL  PSA     Status: None   Collection Time: 12/18/14  7:23 AM  Result Value Ref Range   Prostate Specific Ag, Serum 0.4 0.0 - 4.0 ng/mL    Comment: Roche ECLIA methodology. According to the American Urological Association, Serum PSA should decrease and remain at undetectable levels after radical prostatectomy. The AUA defines biochemical recurrence as an initial PSA value 0.2 ng/mL or greater followed by a subsequent confirmatory PSA value 0.2 ng/mL or greater. Values obtained  with different assay methods or kits cannot be used interchangeably. Results cannot be interpreted as absolute evidence of the presence or absence of malignant disease.   Hgb A1c w/o eAG     Status: Abnormal   Collection Time: 12/18/14  7:23 AM  Result Value Ref Range   Hgb A1c MFr Bld 5.7 (H) 4.8 - 5.6 %    Comment:          Pre-diabetes: 5.7 - 6.4          Diabetes: >6.4          Glycemic control for adults with diabetes: <7.0      Assessment & Plan  1. Cutaneous skin tags -Removed with #10 blade after anesthenization with Xylocaine.  Bleeding controlled with Silver Nitrate stick.  Dressed with triple antibiotic ointment dressing.

## 2015-01-05 NOTE — Patient Instructions (Signed)
Keep skin tag removal site clean with soap an d water.  Dress with Triple antibiotic ointment 2 times a day.

## 2015-01-15 LAB — SPECIMEN STATUS REPORT

## 2015-03-16 ENCOUNTER — Ambulatory Visit: Payer: 59 | Admitting: Family Medicine

## 2015-03-29 ENCOUNTER — Encounter: Payer: Self-pay | Admitting: Family Medicine

## 2015-03-29 ENCOUNTER — Ambulatory Visit (INDEPENDENT_AMBULATORY_CARE_PROVIDER_SITE_OTHER): Payer: 59 | Admitting: Family Medicine

## 2015-03-29 VITALS — BP 109/70 | HR 72 | Temp 97.9°F | Resp 16 | Ht 68.0 in | Wt 185.0 lb

## 2015-03-29 DIAGNOSIS — R7303 Prediabetes: Secondary | ICD-10-CM

## 2015-03-29 DIAGNOSIS — N400 Enlarged prostate without lower urinary tract symptoms: Secondary | ICD-10-CM | POA: Diagnosis not present

## 2015-03-29 DIAGNOSIS — I5030 Unspecified diastolic (congestive) heart failure: Secondary | ICD-10-CM

## 2015-03-29 DIAGNOSIS — K219 Gastro-esophageal reflux disease without esophagitis: Secondary | ICD-10-CM | POA: Diagnosis not present

## 2015-03-29 LAB — POCT GLYCOSYLATED HEMOGLOBIN (HGB A1C): Hemoglobin A1C: 5.9

## 2015-03-29 NOTE — Progress Notes (Signed)
Name: Austin MarrowRobert D Leathers   MRN: [id number]    DOB: 11/29/1955   Date:03/29/2015       Progress Note  Subjective  Chief Complaint  Chief Complaint  Patient presents with  . Gastroesophageal Reflux    HPI  Here for f/u of GERD and BPH.  Also with some Pre-diabetes.  Has been trying to watch diet and has lost some weight. No problem-specific assessment & plan notes found for this encounter.   Past Medical History  Diagnosis Date  . Allergic rhinitis, cause unspecified   . Diverticulosis of colon (without mention of hemorrhage)   . 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 Esophageal reflux   . Osteoarthrosis, unspecified whether generalized or localized, unspecified site   . Cardiac dysrhythmia, unspecified   . Measles without mention of complication   . Mumps without mention of complication   . Allergy     Past Surgical History  Procedure Laterality Date  . Tonsillectomy    . Mitral valve repair    . Hernia repair    . Rotator cuff repair    . Cardiac catheterization  2006    [facility]     Family History  Problem Relation Age of Onset  . Heart disease Mother     s/p stent placement  . Hypertension Mother   . Emphysema Father   . Cancer Maternal Uncle     kidney cancer    Social History   Social History  . Marital Status: Married    Spouse Name: N/A  . Number of Children: N/A  . Years of Education: N/A   Occupational History  . Not on file.   Social History Main Topics  . Smoking status: Never Smoker   . Smokeless tobacco: Never Used  . Alcohol Use: No  . Drug Use: No  . Sexual Activity: Not on file   Other Topics Concern  . Not on file   Social History Narrative     Current outpatient prescriptions:  .  aspirin 81 MG tablet, Take 81 mg by mouth daily., Disp: , Rfl:  .  flecainide (TAMBOCOR) 50 MG tablet, Take 1 tablet  (50 mg total) by mouth 2 (two) times daily., Disp: 180 tablet, Rfl: 3 .  fluticasone (FLONASE) 50 MCG/ACT nasal spray, Place 2 sprays into both nostrils daily. Dispense 3 bottles., Disp: 16 g, Rfl: 3 .  furosemide (LASIX) 20 MG tablet, Take 1 tablet (20 mg total) by mouth daily as needed., Disp: 90 tablet, Rfl: 3 .  Glucosamine-Chondroit-Vit C-Mn (GLUCOSAMINE CHONDR 500 COMPLEX PO), Take 500 mg by mouth daily., Disp: , Rfl:  .  loratadine (CLARITIN) 10 MG tablet, Take 10 mg by mouth daily., Disp: , Rfl:  .  montelukast (SINGULAIR) 10 MG tablet, Take 1 tablet (10 mg total) by mouth at bedtime., Disp: 90 tablet, Rfl: 3 .  omeprazole (PRILOSEC) 20 MG capsule, Take 1 capsule (20 mg total) by mouth daily., Disp: 90 capsule, Rfl: 3 .  tamsulosin (FLOMAX) 0.4 MG CAPS capsule, Take 1 capsule (0.4 mg total) by mouth daily., Disp: 90 capsule, Rfl: 3  Not on File   Review of Systems  Constitutional: Positive for weight loss (desired). Negative for fever, chills and malaise/fatigue.  HENT: Negative for hearing loss.   Eyes: Negative for blurred vision and double vision.  Respiratory: Negative for cough, shortness of breath and wheezing.  Cardiovascular: Positive for palpitations (rare). Negative for chest pain and leg swelling.  Gastrointestinal: Negative for heartburn, abdominal pain and blood in stool.  Genitourinary: Negative for dysuria, urgency and frequency.  Musculoskeletal: Positive for joint pain (bilateral thumb arthritis, s/p bilateral steroid injections.). Negative for myalgias.  Skin: Negative for rash.  Neurological: Negative for dizziness, tremors, weakness and headaches.      Objective  Filed Vitals:   03/29/15 0813  BP: 109/70  Pulse: 72  Temp: 97.9 F (36.6 C)  TempSrc: Oral  Resp: 16  Height:  (1.727 m)  Weight: 185 lb (83.915 kg)    Physical Exam  Constitutional: He is oriented to person, place, and time and well-developed, well-nourished, and in no distress.  No distress.  HENT:  Head: Normocephalic and atraumatic.  Eyes: Conjunctivae and EOM are normal. Pupils are equal, round, and reactive to light. No scleral icterus.  Neck: Normal range of motion. Neck supple. Carotid bruit is not present. No thyromegaly present.  Cardiovascular: Normal rate, regular rhythm, normal heart sounds and intact distal pulses.  Exam reveals no gallop and no friction rub.   No murmur heard. Pulmonary/Chest: Effort normal and breath sounds normal. No respiratory distress. He has no wheezes. He has no rales. He exhibits no tenderness.  Abdominal: Soft. Bowel sounds are normal. He exhibits no distension, no abdominal bruit and no mass. There is no tenderness.  Musculoskeletal: He exhibits no edema.  No thumb pain today.  Lymphadenopathy:    He has no cervical adenopathy.  Neurological: He is alert and oriented to person, place, and time.  Vitals reviewed.      Recent Results (from the past 2160 hour(s))  POCT HgB A1C     Status: Abnormal   Collection Time: 03/29/15  8:20 AM  Result Value Ref Range   Hemoglobin A1C 5.9 %      Assessment & Plan  Problem List Items Addressed This Visit      Cardiovascular and Mediastinum   Diastolic CHF (HCC)     Digestive   GERD without esophagitis     Genitourinary   BPH (benign prostatic hyperplasia)    Other Visit Diagnoses    Pre-diabetes    -  Primary    Relevant Orders    POCT HgB A1C (Completed)       No orders of the defined types were placed in this encounter.   1. Pre-diabetes  - POCT HgB A1C -5.9  2. GERD without esophagitis   3. BPH (benign prostatic hyperplasia)   4. Diastolic congestive heart failure, unspecified congestive heart failure chronicity (HCC)

## 2015-03-29 NOTE — Patient Instructions (Signed)
Continue current meds.    Add exercise to dietary changes to try to get A1c to go down.

## 2015-04-14 ENCOUNTER — Ambulatory Visit (INDEPENDENT_AMBULATORY_CARE_PROVIDER_SITE_OTHER): Payer: 59 | Admitting: Family Medicine

## 2015-04-14 ENCOUNTER — Encounter: Payer: Self-pay | Admitting: Family Medicine

## 2015-04-14 VITALS — BP 110/73 | HR 73 | Temp 97.8°F | Resp 16 | Ht 70.0 in | Wt 185.0 lb

## 2015-04-14 DIAGNOSIS — J0100 Acute maxillary sinusitis, unspecified: Secondary | ICD-10-CM | POA: Diagnosis not present

## 2015-04-14 MED ORDER — AMOXICILLIN-POT CLAVULANATE 875-125 MG PO TABS: 1.0000 | ORAL_TABLET | Freq: Two times a day (BID) | ORAL | Status: DC

## 2015-04-14 MED ORDER — GUAIFENESIN ER 600 MG PO TB12: 600.0000 mg | ORAL_TABLET | Freq: Two times a day (BID) | ORAL | Status: DC

## 2015-04-14 MED ORDER — LORATADINE 10 MG PO TABS: 10.0000 mg | ORAL_TABLET | Freq: Every day | ORAL | Status: DC

## 2015-04-14 MED ORDER — BENZONATATE 100 MG PO CAPS: 100.0000 mg | ORAL_CAPSULE | Freq: Three times a day (TID) | ORAL | Status: DC | PRN

## 2015-04-14 NOTE — Patient Instructions (Signed)
Drink plenty of fluids

## 2015-04-14 NOTE — Progress Notes (Signed)
Name: Austin MarrowRobert D Stewart   MRN: [id number]    DOB: 08/02/1955   Date:04/14/2015       Progress Note  Subjective  Chief Complaint  Chief Complaint  Patient presents with  . Sinus Problem    head and facial pain and congestion in sinuses. x 2-3 wks    HPI  Sinus and nasal congestion x >1 week.  Blowing yellow to dark yellow mucus.  Pressure in face and jaws.  No fever.  Upper teeth with pressure.  Throaty cough.   No problem-specific assessment & plan notes found for this encounter.   Past Medical History  Diagnosis Date  . Allergic rhinitis, cause unspecified   . Diverticulosis of colon (without mention of hemorrhage)   . 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 Esophageal reflux   . Osteoarthrosis, unspecified whether generalized or localized, unspecified site   . Cardiac dysrhythmia, unspecified   . Measles without mention of complication   . Mumps without mention of complication   . Allergy     Social History  Substance Use Topics  . Smoking status: Never Smoker   . Smokeless tobacco: Never Used  . Alcohol Use: No     Current outpatient prescriptions:  .  aspirin 81 MG tablet, Take 81 mg by mouth daily., Disp: , Rfl:  .  flecainide (TAMBOCOR) 50 MG tablet, Take 1 tablet (50 mg total) by mouth 2 (two) times daily., Disp: 180 tablet, Rfl: 3 .  fluticasone (FLONASE) 50 MCG/ACT nasal spray, Place 2 sprays into both nostrils daily. Dispense 3 bottles., Disp: 16 g, Rfl: 3 .  furosemide (LASIX) 20 MG tablet, Take 1 tablet (20 mg total) by mouth daily as needed., Disp: 90 tablet, Rfl: 3 .  Glucosamine-Chondroit-Vit C-Mn (GLUCOSAMINE CHONDR 500 COMPLEX PO), Take 500 mg by mouth daily., Disp: , Rfl:  .  montelukast (SINGULAIR) 10 MG tablet, Take 1 tablet (10 mg total) by mouth at bedtime., Disp: 90 tablet, Rfl: 3 .  omeprazole (PRILOSEC) 20 MG capsule,  Take 1 capsule (20 mg total) by mouth daily., Disp: 90 capsule, Rfl: 3 .  tamsulosin (FLOMAX) 0.4 MG CAPS capsule, Take 1 capsule (0.4 mg total) by mouth daily., Disp: 90 capsule, Rfl: 3  Not on File  Review of Systems  Constitutional: Positive for malaise/fatigue. Negative for fever, chills and weight loss.  HENT: Negative for hearing loss.   Eyes: Negative for blurred vision and double vision.  Respiratory: Positive for cough (throaty) and shortness of breath. Negative for sputum production and wheezing.   Cardiovascular: Positive for palpitations (sl. increased). Negative for chest pain and leg swelling.  Gastrointestinal: Negative for heartburn, abdominal pain and blood in stool.  Genitourinary: Negative for dysuria, urgency and frequency.  Skin: Negative for rash.  Neurological: Positive for headaches. Negative for weakness.      Objective  Filed Vitals:   04/14/15 1013  BP: 110/73  Pulse: 73  Temp: 97.8 F (36.6 C)  Resp: 16  Height: 5\' 10"  (1.778 m)  Weight: 185 lb (83.915 kg)  SpO2: 97%     Physical Exam  Constitutional: No distress.  HENT:  Head: Normocephalic and atraumatic.  Right Ear: External ear normal.  Left Ear: External ear normal.  Nose: Mucosal edema and rhinorrhea (purulent) present. Right sinus exhibits maxillary sinus tenderness. Right sinus exhibits no frontal sinus tenderness. Left sinus exhibits maxillary sinus tenderness. Left  sinus exhibits no frontal sinus tenderness.  Mouth/Throat: Oropharynx is clear and moist.  Cardiovascular: Normal rate, regular rhythm and normal heart sounds.  Exam reveals no gallop and no friction rub.   No murmur heard. Pulmonary/Chest: Effort normal and breath sounds normal. No respiratory distress. He has no wheezes. He has no rales.  Vitals reviewed.     Recent Results (from the past 2160 hour(s))  POCT HgB A1C     Status: Abnormal   Collection Time: 03/29/15  8:20 AM  Result Value Ref Range   Hemoglobin A1C  5.9 %      Assessment & Plan  1. Acute maxillary sinusitis, recurrence not specified  - amoxicillin-clavulanate (AUGMENTIN) 875-125 MG tablet; Take 1 tablet by mouth 2 (two) times daily.  Dispense: 20 tablet; Refill: 0 - benzonatate (TESSALON) 100 MG capsule; Take 1 capsule (100 mg total) by mouth 3 (three) times daily as needed for cough.  Dispense: 20 capsule; Refill: 1 - loratadine (CLARITIN) 10 MG tablet; Take 1 tablet (10 mg total) by mouth daily.  Dispense: 20 tablet; Refill: 1 - guaiFENesin (MUCINEX) 600 MG 12 hr tablet; Take 1 tablet (600 mg total) by mouth 2 (two) times daily.  Dispense: 20 tablet; Refill: 1

## 2015-06-11 ENCOUNTER — Ambulatory Visit (INDEPENDENT_AMBULATORY_CARE_PROVIDER_SITE_OTHER): Payer: 59 | Admitting: Cardiovascular Disease

## 2015-06-11 ENCOUNTER — Encounter: Payer: Self-pay | Admitting: Cardiovascular Disease

## 2015-06-11 VITALS — BP 110/78 | HR 71 | Ht 70.0 in | Wt 186.0 lb

## 2015-06-11 DIAGNOSIS — I5030 Unspecified diastolic (congestive) heart failure: Secondary | ICD-10-CM | POA: Diagnosis not present

## 2015-06-11 DIAGNOSIS — R0602 Shortness of breath: Secondary | ICD-10-CM

## 2015-06-11 DIAGNOSIS — R0789 Other chest pain: Secondary | ICD-10-CM | POA: Insufficient documentation

## 2015-06-11 DIAGNOSIS — R079 Chest pain, unspecified: Secondary | ICD-10-CM

## 2015-06-11 DIAGNOSIS — R002 Palpitations: Secondary | ICD-10-CM | POA: Diagnosis not present

## 2015-06-11 DIAGNOSIS — Z9889 Other specified postprocedural states: Secondary | ICD-10-CM

## 2015-06-11 MED ORDER — METOPROLOL SUCCINATE ER 25 MG PO TB24: 25.0000 mg | ORAL_TABLET | Freq: Every day | ORAL | Status: DC

## 2015-06-11 NOTE — Assessment & Plan Note (Signed)
Appears relatively euvolemic on today's visit Recommended he continue to take Lasix as needed

## 2015-06-11 NOTE — Patient Instructions (Addendum)
You are doing well.  Please start metoprolol succinate 1/2 pill per day for palpitations  We will schedule a CT coronary calcium score for chest pain Tuesday, Feb 14 @ 1:00, please arrive @ 12:45 There is a one time fee of $150 due at the time of your procedure [address] [place], [place] [address] [phone]  Please call us if you have new issues that need to be addressed before your next appt.  Your physician wants you to follow-up in: 12 months.  You will receive a reminder letter in the mail two months in advance. If you don't receive a letter, please call our office to schedule the follow-up appointment.  Coronary Calcium Scan A coronary calcium scan is an imaging test used to look for deposits of calcium and other fatty materials (plaques) in the inner lining of the blood vessels of your heart (coronary arteries). These deposits of calcium and plaques can partly clog and narrow the coronary arteries without producing any symptoms or warning signs. This puts you at risk for a heart attack. This test can detect these deposits before symptoms develop.  LET [facility] CARE PROVIDER KNOW ABOUT:  Any allergies you have.  All medicines you are taking, including vitamins, herbs, eye drops, creams, and over-the-counter medicines.  Previous problems you or members of your family have had with the use of anesthetics.  Any blood disorders you have.  Previous surgeries you have had.  Medical conditions you have.  Possibility of pregnancy, if this applies. RISKS AND COMPLICATIONS Generally, this is a safe procedure. However, as with any procedure, complications can occur. This test involves the use of radiation. Radiation exposure can be dangerous to a pregnant woman and her unborn baby. If you are pregnant, you should not have this procedure done.  BEFORE THE PROCEDURE There is no special preparation for the procedure. PROCEDURE  You will need to undress and put on a  hospital gown. You will need to remove any jewelry around your neck or chest.  Sticky electrodes are placed on your chest and are connected to an electrocardiogram (EKG or electrocardiography) machine to recorda tracing of the electrical activity of your heart.  A CT scanner will take pictures of your heart. During this time, you will be asked to lie still and hold your breath for 2-3 seconds while a picture is being taken of your heart. AFTER THE PROCEDURE   You will be allowed to get dressed.  You can return to your normal activities after the scan is done.   This information is not intended to replace advice given to you by your health care provider. Make sure you discuss any questions you have with your health care provider.   Document Released: 10/14/2007 Document Revised: 04/22/2013 Document Reviewed: 12/23/2012 Elsevier Interactive Patient Education [employer]

## 2015-06-11 NOTE — Assessment & Plan Note (Signed)
Previously declined echocardiogram as he was feeling well If he has any worsening symptoms of shortness of breath or chest tightness, would repeat echocardiogram

## 2015-06-11 NOTE — Assessment & Plan Note (Addendum)
Etiology of his symptoms are unclear, some atypical features of his presentation less concerning for angina. He does not have significant risk factors are family history. For his symptoms of chest discomfort, we have recommended CT coronary calcium scoring   Total encounter time more than 25 minutes  Greater than 50% was spent in counseling and coordination of care with the patient

## 2015-06-11 NOTE — Progress Notes (Signed)
Patient ID: Austin Stewart, male    DOB: [birth year], 60 y.o.   MRN: [id number]  HPI Comments: Austin Stewart is a 60 year old gentleman, patient of Dr. Juanetta Gosling, history of significant mitral valve regurgitation (torn leaflet? by his report) with associated shortness of breath, mitral valve repair in 2006 at [facility] with maze procedure at that time, shoulder surgery March 2013 canceled secondary to arrhythmia, likely PACs  with followup echocardiogram and stress test showing no ischemia by report, shoulder surgery April 2013 who presents for routine followup of his arrhythmia. He is taking flecainide presumably for ectopy. This was started by prior cardiologist.  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 Feels the palpitations are worse the past several months Describes them as extra beats lasting for 10-15 beats at a time, do not occur on a regular basis. Continues to take his flecainide, otherwise not on any other rate controlling agents.  Also sometimes feel a pressure in his chest when he exerts himself  some stress, father with dementia, lives in [place]  Active at baseline, previously working out in [employer] as needed  EKG on today's visit shows normal sinus rhythm with rate 73 bpm, no significant ST or T-wave changes, PVC noted  Other past medical history echocardiogram in 2013 which showed well functioning mitral valve.   Holter monitor in 2013. He does not know what that showed.    cholesterol has not been a problem for the patient. No significant family history of coronary artery disease. Nonsmoker. no coronary artery disease seen on previous cardiac catheterization in 2006 He was started on flecainide 50 mg twice a day for ectopy in 2013.  No Known Allergies  Outpatient Encounter Prescriptions as of 06/11/2015  Medication Sig  . amoxicillin-clavulanate (AUGMENTIN) 875-125 MG tablet Take 1 tablet by mouth 2  (two) times daily.  Marland Kitchen aspirin 81 MG tablet Take 81 mg by mouth daily.  . benzonatate (TESSALON) 100 MG capsule Take 1 capsule (100 mg total) by mouth 3 (three) times daily as needed for cough.  . flecainide (TAMBOCOR) 50 MG tablet Take 1 tablet (50 mg total) by mouth 2 (two) times daily.  . fluticasone (FLONASE) 50 MCG/ACT nasal spray Place 2 sprays into both nostrils daily. Dispense 3 bottles.  . furosemide (LASIX) 20 MG tablet Take 1 tablet (20 mg total) by mouth daily as needed.  . Glucosamine-Chondroit-Vit C-Mn (GLUCOSAMINE CHONDR 500 COMPLEX PO) Take 500 mg by mouth daily.  Marland Kitchen guaiFENesin (MUCINEX) 600 MG 12 hr tablet Take 1 tablet (600 mg total) by mouth 2 (two) times daily.  Marland Kitchen loratadine (CLARITIN) 10 MG tablet Take 1 tablet (10 mg total) by mouth daily.  . montelukast (SINGULAIR) 10 MG tablet Take 1 tablet (10 mg total) by mouth at bedtime.  Marland Kitchen omeprazole (PRILOSEC) 20 MG capsule Take 1 capsule (20 mg total) by mouth daily.  . tamsulosin (FLOMAX) 0.4 MG CAPS capsule Take 1 capsule (0.4 mg total) by mouth daily.   No facility-administered encounter medications on file as of 06/11/2015.    Past Medical History  Diagnosis Date  . Allergic rhinitis, cause unspecified   . Diverticulosis of colon (without mention of hemorrhage)   . 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 Esophageal reflux   . Osteoarthrosis, unspecified whether generalized or localized, unspecified site   .  Cardiac dysrhythmia, unspecified   . Measles without mention of complication   . Mumps without mention of complication   . Allergy     Past Surgical History  Procedure Laterality Date  . Tonsillectomy    . Mitral valve repair    . Hernia repair    . Rotator cuff repair    . Cardiac catheterization  2006    [facility]     Social History  reports that he has never smoked. He has  never used smokeless tobacco. He reports that he does not drink alcohol or use illicit drugs.  Family History family history includes Cancer in his maternal uncle; Emphysema in his father; Heart disease in his mother; Hypertension in his mother.   Review of Systems  Constitutional: Negative.   Respiratory: Positive for chest tightness and shortness of breath.   Cardiovascular: Positive for chest pain.  Gastrointestinal: Negative.   Musculoskeletal: Negative.   Neurological: Negative.   Hematological: Negative.   Psychiatric/Behavioral: Negative.   All other systems reviewed and are negative.   BP 110/78 mmHg  Pulse 71  Ht  (1.778 m)  Wt 186 lb (84.369 kg)  BMI 26.69 kg/m2  Physical Exam  Constitutional: He is oriented to person, place, and time. He appears well-developed and well-nourished.  HENT:  Head: Normocephalic.  Nose: Nose normal.  Mouth/Throat: Oropharynx is clear and moist.  Eyes: Conjunctivae are normal. Pupils are equal, round, and reactive to light.  Neck: Normal range of motion. Neck supple. No JVD present.  Cardiovascular: Normal rate, regular rhythm, S1 normal, S2 normal, normal heart sounds and intact distal pulses.  Exam reveals no gallop and no friction rub.   No murmur heard. Pulmonary/Chest: Effort normal and breath sounds normal. No respiratory distress. He has no wheezes. He has no rales. He exhibits no tenderness.  Abdominal: Soft. Bowel sounds are normal. He exhibits no distension. There is no tenderness.  Musculoskeletal: Normal range of motion. He exhibits no edema or tenderness.  Lymphadenopathy:    He has no cervical adenopathy.  Neurological: He is alert and oriented to person, place, and time. Coordination normal.  Skin: Skin is warm and dry. No rash noted. No erythema.  Psychiatric: He has a normal mood and affect. His behavior is normal. Judgment and thought content normal.      Assessment and Plan   Nursing note and vitals  reviewed.

## 2015-06-11 NOTE — Assessment & Plan Note (Signed)
Shortness of breath symptoms, some chest tightness on exertion. Low risk of coronary disease CT coronary calcium score has been ordered for risk stratification

## 2015-06-14 ENCOUNTER — Other Ambulatory Visit: Payer: Self-pay | Admitting: Cardiovascular Disease

## 2015-06-15 ENCOUNTER — Inpatient Hospital Stay: Admission: RE | Admit: 2015-06-15 | Payer: 59 | Source: Ambulatory Visit

## 2015-06-17 ENCOUNTER — Ambulatory Visit (INDEPENDENT_AMBULATORY_CARE_PROVIDER_SITE_OTHER)
Admission: RE | Admit: 2015-06-17 | Discharge: 2015-06-17 | Disposition: A | Discharge status: Home / Self Care | Payer: Self-pay | Source: Ambulatory Visit | Attending: Cardiovascular Disease | Admitting: Cardiovascular Disease

## 2015-06-17 DIAGNOSIS — R079 Chest pain, unspecified: Secondary | ICD-10-CM

## 2015-06-17 DIAGNOSIS — R002 Palpitations: Secondary | ICD-10-CM

## 2015-06-17 DIAGNOSIS — Z9889 Other specified postprocedural states: Secondary | ICD-10-CM

## 2015-06-24 ENCOUNTER — Other Ambulatory Visit: Payer: Self-pay

## 2015-06-24 MED ORDER — ATORVASTATIN CALCIUM 10 MG PO TABS: 10.0000 mg | ORAL_TABLET | Freq: Every day | ORAL | Status: DC

## 2015-08-02 ENCOUNTER — Other Ambulatory Visit: Payer: Self-pay | Admitting: Cardiovascular Disease

## 2015-08-05 ENCOUNTER — Ambulatory Visit (INDEPENDENT_AMBULATORY_CARE_PROVIDER_SITE_OTHER): Payer: 59 | Admitting: Family Medicine

## 2015-08-05 ENCOUNTER — Encounter: Payer: Self-pay | Admitting: Family Medicine

## 2015-08-05 VITALS — BP 114/73 | HR 71 | Resp 16 | Ht 70.0 in | Wt 188.4 lb

## 2015-08-05 DIAGNOSIS — R002 Palpitations: Secondary | ICD-10-CM

## 2015-08-05 DIAGNOSIS — R7303 Prediabetes: Secondary | ICD-10-CM | POA: Diagnosis not present

## 2015-08-05 DIAGNOSIS — E119 Type 2 diabetes mellitus without complications: Secondary | ICD-10-CM | POA: Diagnosis not present

## 2015-08-05 DIAGNOSIS — K219 Gastro-esophageal reflux disease without esophagitis: Secondary | ICD-10-CM

## 2015-08-05 DIAGNOSIS — J302 Other seasonal allergic rhinitis: Secondary | ICD-10-CM

## 2015-08-05 DIAGNOSIS — E785 Hyperlipidemia, unspecified: Secondary | ICD-10-CM | POA: Diagnosis not present

## 2015-08-05 DIAGNOSIS — N400 Enlarged prostate without lower urinary tract symptoms: Secondary | ICD-10-CM | POA: Diagnosis not present

## 2015-08-05 DIAGNOSIS — R7309 Other abnormal glucose: Secondary | ICD-10-CM

## 2015-08-05 DIAGNOSIS — R739 Hyperglycemia, unspecified: Secondary | ICD-10-CM

## 2015-08-05 LAB — POCT GLYCOSYLATED HEMOGLOBIN (HGB A1C): Hemoglobin A1C: 5.4

## 2015-08-05 NOTE — Progress Notes (Signed)
Name: Austin Stewart   MRN: [id number]    DOB: 08/29/1955   Date:08/05/2015       Progress Note  Subjective  Chief Complaint  Chief Complaint  Patient presents with  . Diabetes    HPI Here for f/u of elevated blood sugar and pre-diabetes.  He has changed his diet some and is stating to exercise more. He has seen Dr. Mariah MillingGollan and he has added Metoprolol and Lipitor for PVCs and increased calcium on card. Scan. On these for 1 month.  No problem-specific assessment & plan notes found for this encounter.   Past Medical History  Diagnosis Date  . Allergic rhinitis, cause unspecified   . Diverticulosis of colon (without mention of hemorrhage)   . 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 Esophageal reflux   . Osteoarthrosis, unspecified whether generalized or localized, unspecified site   . Cardiac dysrhythmia, unspecified   . Measles without mention of complication   . Mumps without mention of complication   . Allergy     Past Surgical History  Procedure Laterality Date  . Tonsillectomy    . Mitral valve repair    . Hernia repair    . Rotator cuff repair    . Cardiac catheterization  2006    [facility]     Family History  Problem Relation Age of Onset  . Heart disease Mother     s/p stent placement  . Hypertension Mother   . Emphysema Father   . Cancer Maternal Uncle     kidney cancer    Social History   Social History  . Marital Status: Married    Spouse Name: N/A  . Number of Children: N/A  . Years of Education: N/A   Occupational History  . Not on file.   Social History Main Topics  . Smoking status: Never Smoker   . Smokeless tobacco: Never Used  . Alcohol Use: No  . Drug Use: No  . Sexual Activity: Not on file   Other Topics Concern  . Not on file   Social History Narrative     Current outpatient prescriptions:  .   aspirin 81 MG tablet, Take 81 mg by mouth daily., Disp: , Rfl:  .  atorvastatin (LIPITOR) 10 MG tablet, TAKE 1 TABLET (10 MG TOTAL) BY MOUTH DAILY., Disp: 30 tablet, Rfl: 3 .  flecainide (TAMBOCOR) 50 MG tablet, TAKE 1 TABLET BY MOUTH 2 TIMES DAILY., Disp: 180 tablet, Rfl: 3 .  fluticasone (FLONASE) 50 MCG/ACT nasal spray, Place 2 sprays into both nostrils daily. Dispense 3 bottles., Disp: 16 g, Rfl: 3 .  furosemide (LASIX) 20 MG tablet, Take 1 tablet (20 mg total) by mouth daily as needed. (Patient taking differently: Take 20 mg by mouth daily as needed. Rarely taken for fluid build up.), Disp: 90 tablet, Rfl: 3 .  Glucosamine-Chondroit-Vit C-Mn (GLUCOSAMINE CHONDR 500 COMPLEX PO), Take 500 mg by mouth daily., Disp: , Rfl:  .  loratadine (CLARITIN) 10 MG tablet, Take 1 tablet (10 mg total) by mouth daily., Disp: 20 tablet, Rfl: 1 .  metoprolol succinate (TOPROL XL) 25 MG 24 hr tablet, Take 1 tablet (25 mg total) by mouth daily. (Patient taking differently: Take 25 mg by mouth daily. Taking 1/2 tablet daily), Disp: 30 tablet, Rfl: 6 .  montelukast (SINGULAIR) 10 MG tablet, Take 1 tablet (10 mg total) by mouth at  bedtime., Disp: 90 tablet, Rfl: 3 .  omeprazole (PRILOSEC) 20 MG capsule, Take 1 capsule (20 mg total) by mouth daily., Disp: 90 capsule, Rfl: 3 .  tamsulosin (FLOMAX) 0.4 MG CAPS capsule, Take 1 capsule (0.4 mg total) by mouth daily., Disp: 90 capsule, Rfl: 3  Not on File   Review of Systems  Constitutional: Negative for fever, chills, weight loss and malaise/fatigue.  HENT: Negative for hearing loss.   Eyes: Negative for blurred vision and double vision.  Respiratory: Negative for cough, shortness of breath and wheezing.   Cardiovascular: Negative for chest pain, palpitations and leg swelling.  Gastrointestinal: Negative for heartburn, abdominal pain and blood in stool.  Genitourinary: Negative for dysuria, urgency and frequency.  Skin: Negative for rash.  Neurological: Negative for  dizziness, tremors, weakness and headaches.      Objective  Filed Vitals:   08/05/15 1044  BP: 114/73  Pulse: 71  Resp: 16  Height:  (1.778 m)  Weight: 188 lb 6.4 oz (85.458 kg)  SpO2: 95%    Physical Exam  Constitutional: He is oriented to person, place, and time and well-developed, well-nourished, and in no distress. No distress.  HENT:  Head: Normocephalic and atraumatic.  Eyes: Conjunctivae and EOM are normal. Pupils are equal, round, and reactive to light. No scleral icterus.  Neck: Normal range of motion. Neck supple. Carotid bruit is not present. No thyromegaly present.  Cardiovascular: Normal rate, regular rhythm and normal heart sounds.  Exam reveals no gallop and no friction rub.   No murmur heard. Pulmonary/Chest: Effort normal and breath sounds normal. No respiratory distress. He has no wheezes. He has no rales.  Abdominal: Soft. Bowel sounds are normal. He exhibits no distension and no mass. There is no tenderness.  Musculoskeletal: Normal range of motion. He exhibits no edema.  Lymphadenopathy:    He has no cervical adenopathy.  Neurological: He is alert and oriented to person, place, and time.  Vitals reviewed.      No results found for this or any previous visit (from the past 2160 hour(s)).   Assessment & Plan  Problem List Items Addressed This Visit      Respiratory   Seasonal allergies     Digestive   GERD without esophagitis     Genitourinary   BPH (benign prostatic hyperplasia)     Other   Heart palpitations   Pre-diabetes   Elevated blood sugar   Elevated lipids   Relevant Orders   Comprehensive Metabolic Panel (CMET)   Lipid Profile    Other Visit Diagnoses    Type 2 diabetes mellitus without complication, without long-term current use of insulin (HCC)    -  Primary    Relevant Orders    POCT HgB A1C       No orders of the defined types were placed in this encounter.      2. Pre-diabetes   3. Elevated blood  sugar -POCT HgB A1C  4. Heart palpitations   5. BPH (benign prostatic hyperplasia)   6. Seasonal allergies   7. GERD without esophagitis   8. Elevated lipids  - Comprehensive Metabolic Panel (CMET) - Lipid Profile  Cont. All current  meds

## 2015-09-14 ENCOUNTER — Encounter: Payer: Self-pay | Admitting: Family Medicine

## 2015-09-14 ENCOUNTER — Ambulatory Visit (INDEPENDENT_AMBULATORY_CARE_PROVIDER_SITE_OTHER): Payer: 59 | Admitting: Family Medicine

## 2015-09-14 VITALS — BP 136/81 | HR 71 | Temp 98.0°F | Resp 16 | Ht 70.0 in | Wt 187.0 lb

## 2015-09-14 DIAGNOSIS — R05 Cough: Secondary | ICD-10-CM

## 2015-09-14 DIAGNOSIS — J0100 Acute maxillary sinusitis, unspecified: Secondary | ICD-10-CM | POA: Diagnosis not present

## 2015-09-14 DIAGNOSIS — R059 Cough, unspecified: Secondary | ICD-10-CM

## 2015-09-14 MED ORDER — AMOXICILLIN-POT CLAVULANATE 875-125 MG PO TABS: 1.0000 | ORAL_TABLET | Freq: Two times a day (BID) | ORAL | Status: DC

## 2015-09-14 NOTE — Progress Notes (Signed)
Name: Austin MarrowRobert D Slay   MRN: [id number]    DOB: 08/20/1955   Date:09/14/2015       Progress Note  Subjective  Chief Complaint  Chief Complaint  Patient presents with  . Sinusitis    HPI Here c/o sinus congestion for past 3-4 days.  Getting some cough now.  Cough getting deeper.  No fever or chills.  Cough prod of yellow to white sputum.  Nasal is yellow mucus.  No problem-specific assessment & plan notes found for this encounter.   Past Medical History  Diagnosis Date  . Allergic rhinitis, cause unspecified   . Diverticulosis of colon (without mention of hemorrhage)   . 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 Esophageal reflux   . Osteoarthrosis, unspecified whether generalized or localized, unspecified site   . Cardiac dysrhythmia, unspecified   . Measles without mention of complication   . Mumps without mention of complication   . Allergy     Social History  Substance Use Topics  . Smoking status: Never Smoker   . Smokeless tobacco: Never Used  . Alcohol Use: No     Current outpatient prescriptions:  .  aspirin 81 MG tablet, Take 81 mg by mouth daily., Disp: , Rfl:  .  atorvastatin (LIPITOR) 10 MG tablet, TAKE 1 TABLET (10 MG TOTAL) BY MOUTH DAILY., Disp: 30 tablet, Rfl: 3 .  flecainide (TAMBOCOR) 50 MG tablet, TAKE 1 TABLET BY MOUTH 2 TIMES DAILY., Disp: 180 tablet, Rfl: 3 .  fluticasone (FLONASE) 50 MCG/ACT nasal spray, Place 2 sprays into both nostrils daily. Dispense 3 bottles., Disp: 16 g, Rfl: 3 .  furosemide (LASIX) 20 MG tablet, Take 1 tablet (20 mg total) by mouth daily as needed. (Patient taking differently: Take 20 mg by mouth daily as needed. Rarely taken for fluid build up.), Disp: 90 tablet, Rfl: 3 .  Glucosamine-Chondroit-Vit C-Mn (GLUCOSAMINE CHONDR 500 COMPLEX PO), Take 500 mg by mouth daily., Disp: , Rfl:  .  loratadine  (CLARITIN) 10 MG tablet, Take 1 tablet (10 mg total) by mouth daily., Disp: 20 tablet, Rfl: 1 .  metoprolol succinate (TOPROL XL) 25 MG 24 hr tablet, Take 1 tablet (25 mg total) by mouth daily. (Patient taking differently: Take 25 mg by mouth daily. Taking 1/2 tablet daily), Disp: 30 tablet, Rfl: 6 .  montelukast (SINGULAIR) 10 MG tablet, Take 1 tablet (10 mg total) by mouth at bedtime., Disp: 90 tablet, Rfl: 3 .  omeprazole (PRILOSEC) 20 MG capsule, Take 1 capsule (20 mg total) by mouth daily., Disp: 90 capsule, Rfl: 3 .  tamsulosin (FLOMAX) 0.4 MG CAPS capsule, Take 1 capsule (0.4 mg total) by mouth daily., Disp: 90 capsule, Rfl: 3 .  amoxicillin-clavulanate (AUGMENTIN) 875-125 MG tablet, Take 1 tablet by mouth 2 (two) times daily., Disp: 20 tablet, Rfl: 0  Not on File  Review of Systems  Constitutional: Positive for malaise/fatigue. Negative for fever, chills and weight loss.  HENT: Positive for congestion. Negative for ear pain, hearing loss and sore throat.   Respiratory: Positive for cough and sputum production. Negative for shortness of breath and wheezing.   Cardiovascular: Negative for chest pain, palpitations and leg swelling.  Gastrointestinal: Negative for heartburn, abdominal pain and blood in stool.  Genitourinary: Positive for frequency. Negative for dysuria and urgency.  Skin: Negative for rash.  Neurological: Positive for headaches. Negative for weakness.  Objective  Filed Vitals:   09/14/15 1330  BP: 136/81  Pulse: 71  Temp: 98 F (36.7 C)  TempSrc: Oral  Resp: 16  Height:  (1.778 m)  Weight: 187 lb (84.823 kg)     Physical Exam  Constitutional: He is well-developed, well-nourished, and in no distress. No distress.  HENT:  Head: Normocephalic and atraumatic.  Right Ear: External ear normal.  Left Ear: External ear normal.  Nose: Rhinorrhea (clear) present. Right sinus exhibits no maxillary sinus tenderness. Left sinus exhibits no maxillary sinus  tenderness.  Mouth/Throat: Oropharynx is clear and moist.  Neck: Normal range of motion. Neck supple. No thyromegaly present.  Cardiovascular: Normal rate, regular rhythm and normal heart sounds.  Exam reveals no gallop and no friction rub.   No murmur heard. Pulmonary/Chest: Effort normal and breath sounds normal. No respiratory distress. He has no wheezes. He has no rales.  Breath sounds sl. coarse  Lymphadenopathy:    He has no cervical adenopathy.  Vitals reviewed.     Recent Results (from the past 2160 hour(s))  POCT HgB A1C     Status: Normal   Collection Time: 08/05/15  2:49 PM  Result Value Ref Range   Hemoglobin A1C 5.4      Assessment & Plan  1. Acute maxillary sinusitis, recurrence not specified  - amoxicillin-clavulanate (AUGMENTIN) 875-125 MG tablet; Take 1 tablet by mouth 2 (two) times daily.  Dispense: 20 tablet; Refill: 0  2. Cough Use OTC Mucinex DM twice a day for cough

## 2015-09-24 DIAGNOSIS — E785 Hyperlipidemia, unspecified: Secondary | ICD-10-CM | POA: Diagnosis not present

## 2015-09-25 LAB — LIPID PANEL
Chol/HDL Ratio: 2 ratio (ref 0.0–5.0)
Cholesterol, Total: 94 mg/dL — ABNORMAL LOW (ref 100–199)
HDL: 48 mg/dL
LDL Calculated: 30 mg/dL (ref 0–99)
Triglycerides: 82 mg/dL (ref 0–149)
VLDL Cholesterol Cal: 16 mg/dL (ref 5–40)

## 2015-09-25 LAB — COMPREHENSIVE METABOLIC PANEL WITH GFR
ALT: 44 IU/L (ref 0–44)
AST: 31 IU/L (ref 0–40)
Albumin/Globulin Ratio: 2.4 — ABNORMAL HIGH (ref 1.2–2.2)
Albumin: 4.3 g/dL (ref 3.6–4.8)
Alkaline Phosphatase: 100 IU/L (ref 39–117)
BUN/Creatinine Ratio: 14 (ref 10–24)
BUN: 16 mg/dL (ref 8–27)
Bilirubin Total: 0.7 mg/dL (ref 0.0–1.2)
CO2: 23 mmol/L (ref 18–29)
Calcium: 9.5 mg/dL (ref 8.6–10.2)
Chloride: 103 mmol/L (ref 96–106)
Creatinine, Ser: 1.13 mg/dL (ref 0.76–1.27)
GFR calc Af Amer: 81 mL/min/1.73
GFR calc non Af Amer: 70 mL/min/1.73
Globulin, Total: 1.8 g/dL (ref 1.5–4.5)
Glucose: 103 mg/dL — ABNORMAL HIGH (ref 65–99)
Potassium: 4.6 mmol/L (ref 3.5–5.2)
Sodium: 143 mmol/L (ref 134–144)
Total Protein: 6.1 g/dL (ref 6.0–8.5)

## 2015-11-16 DIAGNOSIS — M1812 Unilateral primary osteoarthritis of first carpometacarpal joint, left hand: Secondary | ICD-10-CM | POA: Diagnosis not present

## 2015-12-03 ENCOUNTER — Other Ambulatory Visit: Payer: Self-pay | Admitting: Family Medicine

## 2015-12-03 ENCOUNTER — Other Ambulatory Visit: Payer: Self-pay | Admitting: Cardiovascular Disease

## 2015-12-03 DIAGNOSIS — K219 Gastro-esophageal reflux disease without esophagitis: Secondary | ICD-10-CM

## 2015-12-03 DIAGNOSIS — J302 Other seasonal allergic rhinitis: Secondary | ICD-10-CM

## 2015-12-14 ENCOUNTER — Ambulatory Visit (INDEPENDENT_AMBULATORY_CARE_PROVIDER_SITE_OTHER): Payer: 59 | Admitting: Family Medicine

## 2015-12-14 ENCOUNTER — Encounter: Payer: Self-pay | Admitting: Family Medicine

## 2015-12-14 VITALS — BP 122/80 | HR 78 | Temp 97.9°F | Resp 16 | Ht 70.0 in | Wt 187.0 lb

## 2015-12-14 DIAGNOSIS — N400 Enlarged prostate without lower urinary tract symptoms: Secondary | ICD-10-CM | POA: Diagnosis not present

## 2015-12-14 DIAGNOSIS — R7303 Prediabetes: Secondary | ICD-10-CM

## 2015-12-14 DIAGNOSIS — R7309 Other abnormal glucose: Secondary | ICD-10-CM | POA: Diagnosis not present

## 2015-12-14 DIAGNOSIS — R002 Palpitations: Secondary | ICD-10-CM | POA: Diagnosis not present

## 2015-12-14 DIAGNOSIS — I5030 Unspecified diastolic (congestive) heart failure: Secondary | ICD-10-CM

## 2015-12-14 DIAGNOSIS — K219 Gastro-esophageal reflux disease without esophagitis: Secondary | ICD-10-CM | POA: Diagnosis not present

## 2015-12-14 DIAGNOSIS — Z0000 Encounter for general adult medical examination without abnormal findings: Secondary | ICD-10-CM

## 2015-12-14 DIAGNOSIS — E785 Hyperlipidemia, unspecified: Secondary | ICD-10-CM

## 2015-12-14 LAB — POCT GLYCOSYLATED HEMOGLOBIN (HGB A1C): Hemoglobin A1C: 5.7

## 2015-12-14 NOTE — Progress Notes (Signed)
Name: Austin MarrowRobert D Stewart   MRN: [id number]    DOB: 10/07/1955   Date:12/14/2015       Progress Note  Subjective  Chief Complaint  Chief Complaint  Patient presents with  . Annual Exam    HPI  Here for annual complete physical exam.  Has hx of CHF, mitral valve repair, diverticulitis, elevated lipids, mild ASCVD,  GERD, BPH, heart palpitations.  He has seen Dr, Mariah MillingGollan (Card.) about 6 months ago.  Good report except that chest CT showed saw some plaque and started on Lipitor.  Overall he is feeling well without c/o.   No problem-specific Assessment & Plan notes found for this encounter.   Past Medical History:  Diagnosis Date  . Allergic rhinitis, cause unspecified   . Allergy   . Cardiac dysrhythmia, unspecified   . Diverticulosis of colon (without mention of hemorrhage)   . Esophageal reflux   . Measles without mention of complication   . Mumps without mention of complication   . Osteoarthrosis, unspecified whether generalized or localized, unspecified site   . 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 Unspecified hyperplasia of prostate without urinary obstruction and other lower urinary tract symptoms (LUTS)     Past Surgical History:  Procedure Laterality Date  . CARDIAC CATHETERIZATION  2006   [facility]   . HERNIA REPAIR    . MITRAL VALVE REPAIR    . rotator cuff repair    . TONSILLECTOMY      Family History  Problem Relation Age of Onset  . Heart disease Mother     s/p stent placement  . Hypertension Mother   . Emphysema Father   . Dementia Father   . Cancer Maternal Uncle     kidney cancer    Social History   Social History  . Marital status: Married    Spouse name: N/A  . Number of children: N/A  . Years of education: N/A   Occupational History  . Not on file.   Social History Main Topics  . Smoking status: Never Smoker  . Smokeless tobacco: Never Used  . Alcohol use No  . Drug use: No  . Sexual activity: Not on file   Other  Topics Concern  . Not on file   Social History Narrative  . No narrative on file     Current Outpatient Prescriptions:  .  aspirin 81 MG tablet, Take 81 mg by mouth daily., Disp: , Rfl:  .  atorvastatin (LIPITOR) 10 MG tablet, TAKE 1 TABLET BY MOUTH DAILY, Disp: 30 tablet, Rfl: 6 .  flecainide (TAMBOCOR) 50 MG tablet, TAKE 1 TABLET BY MOUTH 2 TIMES DAILY., Disp: 180 tablet, Rfl: 3 .  fluticasone (FLONASE) 50 MCG/ACT nasal spray, Place 2 sprays into both nostrils daily. Dispense 3 bottles. (Patient taking differently: Place 2 sprays into both nostrils daily as needed. Dispense 3 bottles.), Disp: 16 g, Rfl: 3 .  Glucosamine-Chondroit-Vit C-Mn (GLUCOSAMINE CHONDR 500 COMPLEX PO), Take 500 mg by mouth daily., Disp: , Rfl:  .  loratadine (CLARITIN) 10 MG tablet, Take 1 tablet (10 mg total) by mouth daily., Disp: 20 tablet, Rfl: 1 .  metoprolol succinate (TOPROL XL) 25 MG 24 hr tablet, Take 1 tablet (25 mg total) by mouth daily. (Patient taking differently: Take 25 mg by mouth daily. Taking 1/2 tablet daily), Disp: 30 tablet, Rfl: 6 .  montelukast (SINGULAIR) 10 MG tablet, TAKE 1 TABLET (10 MG TOTAL) BY MOUTH AT BEDTIME. (Patient taking differently: Take  10 mg by mouth at bedtime as needed. ), Disp: 90 tablet, Rfl: 0 .  omeprazole (PRILOSEC) 20 MG capsule, TAKE 1 CAPSULE (20 MG TOTAL) BY MOUTH DAILY., Disp: 90 capsule, Rfl: 0 .  tamsulosin (FLOMAX) 0.4 MG CAPS capsule, Take 1 capsule (0.4 mg total) by mouth daily., Disp: 90 capsule, Rfl: 3  Not on File   Review of Systems  Constitutional: Negative for chills, fever, malaise/fatigue and weight loss.  HENT: Positive for ear pain (better now.). Negative for hearing loss.   Eyes: Negative for blurred vision and double vision.  Respiratory: Positive for wheezing (recovering from resp infection (about 1 week).). Negative for cough and shortness of breath.   Cardiovascular: Negative for chest pain, palpitations and leg swelling.  Gastrointestinal:  Negative for abdominal pain, blood in stool and heartburn.  Genitourinary: Negative for dysuria, frequency, hematuria and urgency.       Slowed stream on occasion  Musculoskeletal: Negative for back pain, joint pain and myalgias.  Skin: Negative for rash.  Neurological: Negative for dizziness, tremors, weakness and headaches.  Psychiatric/Behavioral: Negative for depression. The patient does not have insomnia.       Objective  Vitals:   12/14/15 1449  BP: 122/80  Pulse: 78  Resp: 16  Temp: 97.9 F (36.6 C)  TempSrc: Oral  Weight: 187 lb (84.8 kg)  Height: 5\' 10"  (1.778 m)    Physical Exam  Constitutional: He is oriented to person, place, and time and well-developed, well-nourished, and in no distress. No distress.  HENT:  Head: Normocephalic and atraumatic.  Right Ear: External ear normal.  Left Ear: External ear normal.  Nose: Nose normal.  Mouth/Throat:    Eyes: Conjunctivae and EOM are normal. Pupils are equal, round, and reactive to light. No scleral icterus.  Fundoscopic exam:      The right eye shows no arteriolar narrowing, no AV nicking, no exudate, no hemorrhage and no papilledema.       The left eye shows no arteriolar narrowing, no AV nicking, no exudate, no hemorrhage and no papilledema.  Neck: Normal range of motion. Neck supple. Carotid bruit is not present. No tracheal deviation present. No thyromegaly present.  Cardiovascular: Normal rate, regular rhythm, normal heart sounds and intact distal pulses.  Exam reveals no gallop and no friction rub.   No murmur heard. Pulmonary/Chest: Effort normal and breath sounds normal. No respiratory distress. He has no wheezes. He has no rales. Right breast exhibits no inverted nipple, no mass, no nipple discharge, no skin change and no tenderness. Left breast exhibits no inverted nipple, no mass, no nipple discharge, no skin change and no tenderness. Breasts are symmetrical.  Abdominal: Soft. Bowel sounds are normal. He  exhibits no distension and no mass. There is no tenderness.  Genitourinary: Rectum normal, testes/scrotum normal and penis normal. Prostate is enlarged. Prostate is not tender. No discharge found.  Musculoskeletal: Normal range of motion. He exhibits no edema.  Lymphadenopathy:    He has no cervical adenopathy.  Neurological: He is alert and oriented to person, place, and time. No cranial nerve deficit. Gait normal.  Skin: Skin is warm and dry. No rash noted. No erythema. No pallor.  Vitals reviewed.      Recent Results (from the past 2160 hour(s))  Comprehensive Metabolic Panel (CMET)     Status: Abnormal   Collection Time: 09/24/15  7:27 AM  Result Value Ref Range   Glucose 103 (H) 65 - 99 mg/dL   BUN 16 8 -  27 mg/dL   Creatinine, Ser 1.61 0.76 - 1.27 mg/dL   GFR calc non Af Amer 70 >59 mL/min/1.73   GFR calc Af Amer 81 >59 mL/min/1.73   BUN/Creatinine Ratio 14 10 - 24   Sodium 143 134 - 144 mmol/L   Potassium 4.6 3.5 - 5.2 mmol/L   Chloride 103 96 - 106 mmol/L   CO2 23 18 - 29 mmol/L   Calcium 9.5 8.6 - 10.2 mg/dL   Total Protein 6.1 6.0 - 8.5 g/dL   Albumin 4.3 3.6 - 4.8 g/dL   Globulin, Total 1.8 1.5 - 4.5 g/dL   Albumin/Globulin Ratio 2.4 (H) 1.2 - 2.2   Bilirubin Total 0.7 0.0 - 1.2 mg/dL   Alkaline Phosphatase 100 39 - 117 IU/L   AST 31 0 - 40 IU/L   ALT 44 0 - 44 IU/L  Lipid Profile     Status: Abnormal   Collection Time: 09/24/15  7:27 AM  Result Value Ref Range   Cholesterol, Total 94 (L) 100 - 199 mg/dL   Triglycerides 82 0 - 149 mg/dL   HDL 48 >09 mg/dL   VLDL Cholesterol Cal 16 5 - 40 mg/dL   LDL Calculated 30 0 - 99 mg/dL   Chol/HDL Ratio 2.0 0.0 - 5.0 ratio units    Comment:                                   T. Chol/HDL Ratio                                             Men  Women                               1/2 Avg.Risk  3.4    3.3                                   Avg.Risk  5.0    4.4                                2X Avg.Risk  9.6    7.1                                 3X Avg.Risk 23.4   11.0   POCT HgB A1C     Status: Abnormal   Collection Time: 12/14/15  3:08 PM  Result Value Ref Range   Hemoglobin A1C 5.7%      Assessment & Plan  Problem List Items Addressed This Visit      Cardiovascular and Mediastinum   Diastolic CHF (HCC)     Digestive   GERD without esophagitis     Genitourinary   BPH (benign prostatic hyperplasia)     Other   Heart palpitations   Pre-diabetes   Elevated lipids    Other Visit Diagnoses    Health maintenance examination    -  Primary   Elevated glucose       Relevant Orders   POCT HgB A1C (Completed)  No orders of the defined types were placed in this encounter.  1. Health maintenance examination   2. Elevated glucose  - POCT HgB A1C-5.7 Reduce starches and sugars. 3. Diastolic congestive heart failure, unspecified congestive heart failure chronicity (HCC)  Cont meds 4. GERD without esophagitis  Cont med 5. BPH (benign prostatic hyperplasia) Cont med  6. Pre-diabetes   7. Heart palpitations Cont med.  8. Elevated lipids Cont Lipitor

## 2015-12-27 ENCOUNTER — Ambulatory Visit (INDEPENDENT_AMBULATORY_CARE_PROVIDER_SITE_OTHER): Payer: 59 | Admitting: Family Medicine

## 2015-12-27 ENCOUNTER — Encounter: Payer: Self-pay | Admitting: Family Medicine

## 2015-12-27 VITALS — BP 122/88 | HR 81 | Temp 98.1°F | Resp 16 | Ht 70.0 in | Wt 190.0 lb

## 2015-12-27 DIAGNOSIS — J0101 Acute recurrent maxillary sinusitis: Secondary | ICD-10-CM | POA: Diagnosis not present

## 2015-12-27 MED ORDER — BENZONATATE 100 MG PO CAPS: 100.0000 mg | ORAL_CAPSULE | Freq: Three times a day (TID) | ORAL | 0 refills | Status: DC | PRN

## 2015-12-27 MED ORDER — AMOXICILLIN-POT CLAVULANATE 875-125 MG PO TABS
1.0000 | ORAL_TABLET | Freq: Two times a day (BID) | ORAL | 0 refills | Status: DC
Start: 2015-12-27 — End: 2016-02-01

## 2015-12-27 MED ORDER — SALINE SPRAY 0.65 % NA SOLN: 1.0000 | NASAL | 0 refills | Status: DC | PRN

## 2015-12-27 NOTE — Patient Instructions (Signed)
You can use supportive care at home to help with your symptoms. I have sent Mucinex DM to your pharmacy to help break up the congestion and soothe your cough. You can takes this twice daily.  I have also sent tesslon perles to your pharmacy to help with the cough- you can take these 3 times daily as needed. Honey is a natural cough suppressant- so add it to your tea in the morning.  If you have a humidifer, set that up in your bedroom at night.   Please seek immediate medical attention if you develop shortness of breath not relieve by inhaler, chest pain/tightness, fever > 103 F or other concerning symptoms.   

## 2015-12-27 NOTE — Progress Notes (Signed)
Subjective:    Patient ID: Austin Stewart, male    DOB: May 06, 1955, 60 y.o.   MRN: [id number]  HPI: Austin Stewart is a 60 y.o. male presenting on 12/27/2015 for Sinusitis (onset 2 weeks HA and facial pain Right side congestion and cough with clear mucus no fever pt had taken OTC meds)   HPI  Pt presents for nasal congestion and possible sinusitis. Started with cold symptoms >2 weeks ago. Is still having congestion and some dizziness. Clear drainage. His teeth hurt. Facial pain.  R>L. No fevers at home. Cough- non-productive. No trouble breathing.  Home treatment: Mucinex 12 hour. Mild improvement.   Past Medical History:  Diagnosis Date  . Allergic rhinitis, cause unspecified   . Allergy   . Cardiac dysrhythmia, unspecified   . Diverticulosis of colon (without mention of hemorrhage)   . Esophageal reflux   . Measles without mention of complication   . Mumps without mention of complication   . Osteoarthrosis, unspecified whether generalized or localized, unspecified site   . 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 Unspecified hyperplasia of prostate without urinary obstruction and other lower urinary tract symptoms (LUTS)     Current Outpatient Prescriptions on File Prior to Visit  Medication Sig  . aspirin 81 MG tablet Take 81 mg by mouth daily.  Marland Kitchen atorvastatin (LIPITOR) 10 MG tablet TAKE 1 TABLET BY MOUTH DAILY  . flecainide (TAMBOCOR) 50 MG tablet TAKE 1 TABLET BY MOUTH 2 TIMES DAILY.  . fluticasone (FLONASE) 50 MCG/ACT nasal spray Place 2 sprays into both nostrils daily. Dispense 3 bottles. (Patient taking differently: Place 2 sprays into both nostrils daily as needed. Dispense 3 bottles.)  . Glucosamine-Chondroit-Vit C-Mn (GLUCOSAMINE CHONDR 500 COMPLEX PO) Take 500 mg by mouth daily.  Marland Kitchen loratadine (CLARITIN) 10 MG tablet Take 1 tablet (10 mg total) by mouth daily.  . metoprolol succinate (TOPROL XL) 25 MG 24 hr tablet Take 1 tablet (25 mg total) by mouth  daily. (Patient taking differently: Take 25 mg by mouth daily. Taking 1/2 tablet daily)  . montelukast (SINGULAIR) 10 MG tablet TAKE 1 TABLET (10 MG TOTAL) BY MOUTH AT BEDTIME. (Patient taking differently: Take 10 mg by mouth at bedtime as needed. )  . omeprazole (PRILOSEC) 20 MG capsule TAKE 1 CAPSULE (20 MG TOTAL) BY MOUTH DAILY.  . tamsulosin (FLOMAX) 0.4 MG CAPS capsule Take 1 capsule (0.4 mg total) by mouth daily.   No current facility-administered medications on file prior to visit.     Review of Systems  Constitutional: Negative for chills and fever.  HENT: Positive for congestion, postnasal drip, rhinorrhea and sinus pressure. Negative for ear pain and sneezing.   Respiratory: Positive for cough. Negative for chest tightness, shortness of breath and wheezing.   Cardiovascular: Negative for chest pain, palpitations and leg swelling.  Skin: Negative.   Allergic/Immunologic: Positive for environmental allergies.  Neurological: Negative for headaches.   Per HPI unless specifically indicated above     Objective:    BP 122/88 (BP Location: Right Arm, Patient Position: Sitting, Cuff Size: Normal)   Pulse 81   Temp 98.1 F (36.7 C) (Oral)   Resp 16   Ht 5\' 10"  (1.778 m)   Wt 190 lb (86.2 kg)   SpO2 96%   BMI 27.26 kg/m   Wt Readings from Last 3 Encounters:  12/27/15 190 lb (86.2 kg)  12/14/15 187 lb (84.8 kg)  09/14/15 187 lb (84.8 kg)    Physical Exam  Constitutional: He appears well-developed and well-nourished. No distress.  HENT:  Head: Normocephalic and atraumatic.  Right Ear: Hearing and tympanic membrane normal. Tympanic membrane is not erythematous and not bulging.  Left Ear: Hearing normal. Tympanic membrane is not erythematous and not bulging. A middle ear effusion is present.  Nose: Mucosal edema and rhinorrhea present. Right sinus exhibits maxillary sinus tenderness and frontal sinus tenderness. Left sinus exhibits no maxillary sinus tenderness and no frontal  sinus tenderness.  Mouth/Throat: Uvula is midline and mucous membranes are normal. No uvula swelling. Posterior oropharyngeal erythema present. No posterior oropharyngeal edema.  Neck: Neck supple. No Brudzinski's sign and no Kernig's sign noted.  Cardiovascular: Normal rate, regular rhythm and normal heart sounds.   Pulmonary/Chest: Breath sounds normal. No accessory muscle usage. No tachypnea. No respiratory distress.  Lymphadenopathy:    He has no cervical adenopathy.   Results for orders placed or performed in visit on 12/14/15  POCT HgB A1C  Result Value Ref Range   Hemoglobin A1C 5.7%       Assessment & Plan:   Problem List Items Addressed This Visit    None    Visit Diagnoses    Acute recurrent maxillary sinusitis    -  Primary   Treast for ABRS given >10days persistent symptoms. Augmentin BID. Supportive care at home. Alarm symptoms reviewed. Return if not improving.    Relevant Medications   amoxicillin-clavulanate (AUGMENTIN) 875-125 MG tablet   benzonatate (TESSALON) 100 MG capsule   sodium chloride (OCEAN) 0.65 % SOLN nasal spray      Meds ordered this encounter  Medications  . amoxicillin-clavulanate (AUGMENTIN) 875-125 MG tablet    Sig: Take 1 tablet by mouth 2 (two) times daily.    Dispense:  14 tablet    Refill:  0    Order Specific Question:   Supervising Provider    Answer:   Janeann ForehandHAWKINS JR, JAMES H [phone][[id number]]  . benzonatate (TESSALON) 100 MG capsule    Sig: Take 1 capsule (100 mg total) by mouth 3 (three) times daily as needed.    Dispense:  30 capsule    Refill:  0    Order Specific Question:   Supervising Provider    Answer:   Janeann ForehandHAWKINS JR, JAMES H [[id number]]  . sodium chloride (OCEAN) 0.65 % SOLN nasal spray    Sig: Place 1 spray into both nostrils as needed for congestion.    Dispense:  15 mL    Refill:  0    Order Specific Question:   Supervising Provider    Answer:   Janeann ForehandHAWKINS JR, JAMES H [[id number]]      Follow up plan: Return if symptoms worsen or fail  to improve.

## 2016-01-14 ENCOUNTER — Other Ambulatory Visit: Payer: Self-pay | Admitting: Family Medicine

## 2016-01-14 DIAGNOSIS — N400 Enlarged prostate without lower urinary tract symptoms: Secondary | ICD-10-CM

## 2016-01-17 DIAGNOSIS — K1379 Other lesions of oral mucosa: Secondary | ICD-10-CM | POA: Diagnosis not present

## 2016-02-01 ENCOUNTER — Ambulatory Visit (INDEPENDENT_AMBULATORY_CARE_PROVIDER_SITE_OTHER): Payer: 59 | Admitting: Family Medicine

## 2016-02-01 ENCOUNTER — Encounter: Payer: Self-pay | Admitting: Family Medicine

## 2016-02-01 VITALS — BP 130/75 | HR 68 | Temp 98.1°F | Resp 16 | Ht 70.0 in | Wt 191.0 lb

## 2016-02-01 DIAGNOSIS — M7062 Trochanteric bursitis, left hip: Secondary | ICD-10-CM | POA: Diagnosis not present

## 2016-02-01 DIAGNOSIS — Z23 Encounter for immunization: Secondary | ICD-10-CM | POA: Diagnosis not present

## 2016-02-01 MED ORDER — NAPROXEN 500 MG PO TBEC: 500.0000 mg | DELAYED_RELEASE_TABLET | Freq: Two times a day (BID) | ORAL | 0 refills | Status: DC

## 2016-02-01 MED ORDER — BACLOFEN 10 MG PO TABS: 5.0000 mg | ORAL_TABLET | Freq: Two times a day (BID) | ORAL | 0 refills | Status: DC | PRN

## 2016-02-01 NOTE — Patient Instructions (Signed)
Thank you for coming in to clinic today.  1. You most likely have "Trochanteric Bursitis" in your Left hip, inflammation of the synovial fluid filled bursa sac around the hip joint. Possibly have some mild degeneration and osteoarthritis in this hip to trigger this problem. However this is a common issue and is now just flared up. - Recommend trial of Anti-inflammatory with Naproxen (Naprosyn) 500mg  tabs - take one with food and plenty of water TWICE daily every day (breakfast and dinner), for next 2 to 4 weeks, then you may take only as needed - DO NOT TAKE any ibuprofen, aleve, motrin while you are taking this medicine - It is safe to take Tylenol Ext Str 500mg  tabs - take 1 to 2 (max dose 1000mg ) every 6 hours as needed for breakthrough pain, max 24 hour daily dose is 6 to 8 tablets or 4000mg   Start taking Baclofen (Lioresal) 10mg  (muscle relaxant) - start with half to one pill at night as needed for next 1-3 nights (may make you drowsy, caution with driving) see how it affects you, then if tolerated increase to one pill 2 to 3 times a day or (every 8 hours as needed)  Use heating pad as needed, hot tub, regular daily stretches, modify activities at work and avoid prolonged standing/walking if you can  This may take 4-6 weeks to significantly improve, but avoid re-injury and overuse  Please schedule a follow-up appointment with Dr. Althea CharonKaramalegos in 4-6 weeks to follow-up Left Hip Trochanteric Bursitis, if not improved. Future can consider steroid injection, or [facility]  If you have any other questions or concerns, please feel free to call the clinic or send a message through MyChart. You may also schedule an earlier appointment if necessary.  Saralyn PilarAlexander Akima Slaugh, DO [facility]

## 2016-02-01 NOTE — Assessment & Plan Note (Signed)
New gradual worsening problem with subacute L Hip pain (very mild R hip pain), likely trochanteric bursitis given history and exam with point tenderness. Suspect underlying mild osteoarthritis as possible cause, without known injury or trauma. H/o OA in other joints. No prior hip problems or imaging. - No radiation of pain or radicular symptoms - Inadequate conservative therapy  Plan: 1. Start anti-inflammatory trial with rx Naprosyn 500mg  BID wc x 2-4 weeks, then PRN 2. Start muscle relaxant with Baclofen 10mg  tabs - take 5-10mg  up to BID PRN, titrate up as tolerated 3. May use Tylenol PRN for breakthrough 4. Encouraged use of heating pad 1-2x daily for now then PRN. Continue stretches daily, and modify activities 5. Follow-up 4-6 weeks if not improving, consider hip x-rays, future troch bursa steroid injection, may follow-up with already established Ortho in  if needed

## 2016-02-01 NOTE — Progress Notes (Signed)
Subjective:    Patient ID: Austin MarrowRobert D Stewart, male    DOB: 09/25/1955, 60 y.o.   MRN: [id number]  Austin Stewart is a 60 y.o. male presenting on 02/01/2016 for Hip Pain (Left Hip painful with ROM)   HPI   Acute Hip Pain, Left - Reports symptoms started 1-2 weeks ago with some mild hip pain and resistance in Left hip usually when he would get up from chair, gradual worsening after [place] he walked 1 mile each day, worsening after walking, pain severity max of 8/10, no radiation of pain into groin, back or leg, but does admit to muscle tightness on lateral legs, tried Aleve x 2 pills one day with mild relief, not tried heat/cold, occasionally Tylenol has not tried for this problem. Does not have pain at rest. 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 - Denies any fevers/chills, numbness, weakness, tingling, trauma fall or injury  Health Maintenance: - Due for Flu shot today, will get and needs document   Social History  Substance Use Topics  . Smoking status: Never Smoker  . Smokeless tobacco: Never Used  . Alcohol use No    Review of Systems Per HPI unless specifically indicated above     Objective:    BP 130/75 (BP Location: Left Arm, Patient Position: Sitting, Cuff Size: Normal)   Pulse 68   Temp 98.1 F (36.7 C) (Oral)   Resp 16   Ht 5\' 10"  (1.778 m)   Wt 191 lb (86.6 kg)   BMI 27.41 kg/m   Wt Readings from Last 3 Encounters:  02/01/16 191 lb (86.6 kg)  12/27/15 190 lb (86.2 kg)  12/14/15 187 lb (84.8 kg)    Physical Exam  Constitutional: He appears well-developed and  well-nourished. No distress.  Well-appearing, comfortable, cooperative  Cardiovascular: Normal rate.   Pulmonary/Chest: Effort normal.  Musculoskeletal: Normal range of motion. He exhibits no edema.  Hips, Bilateral Inspection: Normal appearance, no deformity. Palpation: Left hip point tenderness over greater trochanter only, otherwise compression non-tender. Right hip non-tender. ROM: Full active ROM with knee flex ext, hip flex / ext. Bilateral internal and external rotation both hips normal, except very slightly reduced Left hip internal rotation due to stiffness. Special Testing: FABER normal bilateral without pain, FADIR Left hip with some mild discomfort at end of motion, without early acetabular symptoms. Acetabular loading test without significant discomfort. Strength: Bilateral hips 5/5 flex/ext, knee flex/ext Neurovascular: distally intact to light touch   Neurological: He is alert.  Gait normal  Skin: Skin is warm and dry. No rash noted. He is not diaphoretic. No erythema.  Nursing note and vitals reviewed.      Assessment & Plan:   Problem List Items Addressed This Visit    Trochanteric bursitis, left hip - Primary    New gradual worsening problem with subacute L Hip pain (very mild R hip pain), likely trochanteric bursitis given history and exam with point tenderness. Suspect underlying mild osteoarthritis as possible cause, without known injury or trauma. H/o OA in other joints. No prior hip problems or imaging. - No radiation of pain or radicular symptoms -  Inadequate conservative therapy  Plan: 1. Start anti-inflammatory trial with rx Naprosyn 500mg  BID wc x 2-4 weeks, then PRN 2. Start muscle relaxant with Baclofen 10mg  tabs - take 5-10mg  up to BID PRN, titrate up as tolerated 3. May use Tylenol PRN for breakthrough 4. Encouraged use of heating pad 1-2x daily for now then PRN. Continue stretches daily, and modify activities 5. Follow-up 4-6 weeks if not improving,  consider hip x-rays, future troch bursa steroid injection, may follow[place] if [place]      Relevant Medications   naproxen (EC NAPROSYN) 500 MG EC tablet   baclofen (LIORESAL) 10 MG tablet    Other Visit Diagnoses    Need for influenza vaccination       Relevant Orders   Flu Vaccine QUAD 36+ mos PF IM (Fluarix & Fluzone Quad PF) (Completed)      Meds ordered this encounter  Medications  . naproxen (EC NAPROSYN) 500 MG EC tablet    Sig: Take 1 tablet (500 mg total) by mouth 2 (two) times daily with a meal.    Dispense:  45 tablet    Refill:  0  . baclofen (LIORESAL) 10 MG tablet    Sig: Take 0.5-1 tablets (5-10 mg total) by mouth 2 (two) times daily as needed for muscle spasms.    Dispense:  20 each    Refill:  0      Follow up plan: Return in about 4 weeks (around 02/29/2016) for left hip pain, bursitis.  Saralyn Pilar, DO [facility] 02/01/2016, 8:57 AM

## 2016-02-17 DIAGNOSIS — H524 Presbyopia: Secondary | ICD-10-CM | POA: Diagnosis not present

## 2016-02-22 DIAGNOSIS — K1379 Other lesions of oral mucosa: Secondary | ICD-10-CM | POA: Diagnosis not present

## 2016-03-03 DIAGNOSIS — K1379 Other lesions of oral mucosa: Secondary | ICD-10-CM | POA: Diagnosis not present

## 2016-03-09 ENCOUNTER — Other Ambulatory Visit: Payer: Self-pay | Admitting: Family Medicine

## 2016-03-09 DIAGNOSIS — J302 Other seasonal allergic rhinitis: Secondary | ICD-10-CM

## 2016-03-16 DIAGNOSIS — M189 Osteoarthritis of first carpometacarpal joint, unspecified: Secondary | ICD-10-CM | POA: Diagnosis not present

## 2016-03-22 ENCOUNTER — Telehealth: Payer: Self-pay | Admitting: Cardiovascular Disease

## 2016-03-22 NOTE — Telephone Encounter (Signed)
Pt calling stating past few weeks he's having some fluttering Pt states this week its been more irregular and more fluttering Would like some advise on this Please call back

## 2016-03-22 NOTE — Telephone Encounter (Signed)
We haven't seen Austin Stewart since February.  Does he want to come in this afternoon or Monday if Dr. Mariah MillingGollan still has an opening?

## 2016-03-22 NOTE — Telephone Encounter (Signed)
Pt is coming now on Monday 03/27/16 to see Dr Mariah MillingGollan

## 2016-03-27 ENCOUNTER — Encounter: Payer: Self-pay | Admitting: Cardiovascular Disease

## 2016-03-27 ENCOUNTER — Ambulatory Visit (INDEPENDENT_AMBULATORY_CARE_PROVIDER_SITE_OTHER): Payer: 59 | Admitting: Cardiovascular Disease

## 2016-03-27 VITALS — BP 126/88 | HR 66 | Ht 70.0 in | Wt 189.0 lb

## 2016-03-27 DIAGNOSIS — R000 Tachycardia, unspecified: Secondary | ICD-10-CM

## 2016-03-27 DIAGNOSIS — I2510 Atherosclerotic heart disease of native coronary artery without angina pectoris: Secondary | ICD-10-CM

## 2016-03-27 DIAGNOSIS — R002 Palpitations: Secondary | ICD-10-CM | POA: Diagnosis not present

## 2016-03-27 DIAGNOSIS — R0602 Shortness of breath: Secondary | ICD-10-CM | POA: Diagnosis not present

## 2016-03-27 DIAGNOSIS — I5030 Unspecified diastolic (congestive) heart failure: Secondary | ICD-10-CM

## 2016-03-27 DIAGNOSIS — R079 Chest pain, unspecified: Secondary | ICD-10-CM

## 2016-03-27 DIAGNOSIS — I493 Ventricular premature depolarization: Secondary | ICD-10-CM

## 2016-03-27 NOTE — Patient Instructions (Addendum)
Medication Instructions:   No medication changes made  Labwork:  No new labs needed  Testing/Procedures:  We will order a event monitor for palpitations, tachycardia, lightheadedness, PVCs   I recommend watching educational videos on topics of interest to you at:       www.goemmi.com  Enter code: [email or web address]    Follow-Up: It was a pleasure seeing you in the office today. Please call us if you have new issues that need to be addressed before your next appt.  [phone]  Your physician wants you to follow-up in: 6 months.  You will receive a reminder letter in the mail two months in advance. If you don't receive a letter, please call our office to schedule the follow-up appointment.  If you need a refill on your cardiac medications before your next appointment, please call your pharmacy.

## 2016-03-27 NOTE — Progress Notes (Addendum)
Cardiology Office Note  Date:  03/27/2016   ID:  Austin MarrowRobert D Stewart, DOB 03/30/1956, MRN [id number]  PCP:  Austin LevyJames Hawkins Jr, MD   Chief Complaint  Patient presents with  . other    Early 20mo f/u. Pt c/o palpitations, minor chest pain, sob. Reviewed meds with pt verbally.    HPI:  [name] is a 60 year old gentleman, patient of Dr. Juanetta Stewart, history of significant mitral valve regurgitation (torn leaflet? by his report) with associated shortness of breath, [facility] with maze procedure at that time, shoulder surgery March 2013 canceled secondary to arrhythmia, likely PACs  with followup echocardiogram and stress test showing no ischemia by report, shoulder surgery April 2013 who presents for routine followup of his arrhythmia. He is taking flecainide presumably for ectopy. This was started by prior cardiologist.  CT scan March 2017 Coronary calcium score of 130. This was 6564 percentile for age and sex matched control.  He was started on Lipitor 10 mg daily, reported having hives, stopped the medication Cholesterol dropped from 168 down to 93 on Lipitor in May 2017  In follow-up he reports having ectopy, tachycardia, palpitations Symptoms getting worse over the past several months Having symptoms every other day Father passed away earlier in 2017, lived in [place]  Continues to take his flecainide 50 mg twice a day , metoprolol 12.5 mg daily No significant chest pain  Active at baseline, previously working out in [employer] Takes Lasix as needed  EKG on today's visit shows normal sinus rhythm with rate 66 bpm, no significant ST or T-wave changes. Previous EKG with PVCs  Other past medical history echocardiogram in 2013 which showed well functioning mitral valve.   Holter monitor in 2013. He does not know what that showed.    cholesterol has not been a problem for the patient. No significant family history of coronary artery disease.  Nonsmoker. no coronary artery disease seen on previous cardiac catheterization in 2006 He was started on flecainide 50 mg twice a day for ectopy in 2013.   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  PSH:    Past Surgical History:  Procedure Laterality Date  . CARDIAC CATHETERIZATION  2006   [facility]   . HERNIA REPAIR    . MITRAL VALVE REPAIR    . rotator cuff repair    . TONSILLECTOMY      Current Outpatient Prescriptions  Medication Sig Dispense Refill  . aspirin 81 MG tablet Take 81 mg by mouth daily.    . flecainide (TAMBOCOR) 50 MG tablet TAKE 1 TABLET BY MOUTH 2 TIMES DAILY. 180 tablet 3  . fluticasone (FLONASE) 50 MCG/ACT nasal spray Place 2 sprays into both nostrils daily. Dispense 3 bottles. (Patient taking differently: Place 2 sprays into both nostrils daily as needed. Dispense 3 bottles.) 16 g 3  . Glucosamine-Chondroit-Vit C-Mn (GLUCOSAMINE CHONDR 500 COMPLEX PO) Take 500 mg by mouth daily.    Marland Kitchen. loratadine (CLARITIN) 10 MG tablet Take 1 tablet (10 mg total) by mouth daily. (Patient taking differently: Take 10 mg by mouth daily as needed. ) 20 tablet 1  . metoprolol  succinate (TOPROL XL) 25 MG 24 hr tablet Take 1 tablet (25 mg total) by mouth daily. (Patient taking differently: Take 25 mg by mouth daily. Taking 1/2 tablet daily) 30 tablet 6  . montelukast (SINGULAIR) 10 MG tablet TAKE 1 TABLET (10 MG TOTAL) BY MOUTH AT BEDTIME. 90 tablet 3  . naproxen (EC NAPROSYN) 500 MG EC tablet Take 1 tablet (500 mg  total) by mouth 2 (two) times daily with a meal. 45 tablet 0  . omeprazole (PRILOSEC) 20 MG capsule TAKE 1 CAPSULE (20 MG TOTAL) BY MOUTH DAILY. 90 capsule 0  . sodium chloride (OCEAN) 0.65 % SOLN nasal spray Place 1 spray into both nostrils as needed for congestion. 15 mL 0  . tamsulosin (FLOMAX) 0.4 MG CAPS capsule TAKE 1 CAPSULE BY MOUTH DAILY. 90 capsule 0   No current facility-administered medications for this visit.      Allergies:   Patient has no known allergies.   Social History:  The patient  reports that he has never smoked. He has never used smokeless tobacco. He reports that he does not drink alcohol or use drugs.   Family History:   family history includes Cancer in his maternal uncle; Dementia in his father; Emphysema in his father; Heart disease in his mother; Hypertension in his mother.    Review of Systems: Review of Systems  Constitutional: Negative.   Respiratory: Negative.   Cardiovascular: Positive for palpitations.       Tachycardia  Gastrointestinal: Negative.   Musculoskeletal: Negative.   Skin:       Hives with Lipitor  Neurological: Negative.   Psychiatric/Behavioral: Negative.   All other systems reviewed and are negative.    PHYSICAL EXAM: VS:  BP 126/88 (BP Location: Left Arm, Patient Position: Sitting, Cuff Size: Normal)   Pulse 66   Ht 5\' 10"  (1.778 m)   Wt 189 lb (85.7 kg)   BMI 27.12 kg/m  , BMI Body mass index is 27.12 kg/m. GEN: Well nourished, well developed, in no acute distress  HEENT: normal  Neck: no JVD, carotid bruits, or masses Cardiac: RRR; no murmurs, rubs, or gallops,no edema  Respiratory:  clear to auscultation bilaterally, normal work of breathing GI: soft, nontender, nondistended, + BS MS: no deformity or atrophy  Skin: warm and dry, no rash Neuro:  Strength and sensation are intact Psych: euthymic mood, full affect    Recent Labs: 09/24/2015: ALT 44; BUN 16; Creatinine, Ser 1.13; Potassium 4.6; Sodium 143     Lipid Panel Lab Results  Component Value Date   CHOL 94 (L) 09/24/2015   HDL 48 09/24/2015   LDLCALC 30 09/24/2015   TRIG 82 09/24/2015      Wt Readings from Last 3 Encounters:  03/27/16 189 lb (85.7 kg)  02/01/16 191 lb (86.6 kg)  12/27/15 190 lb (86.2 kg)       ASSESSMENT AND PLAN:  Diastolic congestive heart failure, unspecified congestive heart failure chronicity (HCC) - Plan: EKG 12-Lead, LONG TERM MONITOR (3-14 DAYS) Continues to take Lasix as needed, denies any leg swelling or shortness of breath on exertion  Chest pain, unspecified type - Plan: EKG 12-Lead, LONG TERM MONITOR (3-14 DAYS) Rare atypical chest pain, no further workup at this time Possibly secondary to stress  SOB (shortness of breath) - Plan: EKG 12-Lead, LONG TERM MONITOR (3-14 DAYS)  Palpitation - Plan: LONG TERM MONITOR (3-14 DAYS) Long discussion concerning his ectopy. Unable to exclude other arrhythmia such as atrial fibrillation. Various treatment options  discussed with him including continuing his current medications, adding extra doses of his medications, and doing a monitor Long term monitor has been ordered for symptoms. While monitors in place, recommended he try extra metoprolol or extra dose of flecainide for symptom relief and record this in a diary  PVC (premature ventricular contraction) - Plan: LONG TERM MONITOR (3-14 DAYS) Previously seen on EKG Likely causing some of his ectopy symptoms  Tachycardia - Plan: LONG TERM MONITOR (3-14 DAYS)  Coronary artery disease CT coronary calcium score 150 in early 2017 Having hives on Lipitor daily. Recommended he try every other day. If he continues to have hives will change his statin. Goal LDL less than 70   Total encounter time more than 25 minutes  Greater than 50% was spent in counseling and coordination of care with the patient   Disposition:   F/U  6 months   Orders Placed This Encounter  Procedures  . LONG TERM MONITOR (3-14  DAYS)  . EKG 12-Lead     Signed, Dossie Arbourim Gollan, M.D., Ph.D. 03/27/2016  [facility], [place] [phone]

## 2016-03-28 ENCOUNTER — Ambulatory Visit (INDEPENDENT_AMBULATORY_CARE_PROVIDER_SITE_OTHER): Payer: 59

## 2016-03-28 DIAGNOSIS — R0602 Shortness of breath: Secondary | ICD-10-CM | POA: Diagnosis not present

## 2016-03-28 DIAGNOSIS — R079 Chest pain, unspecified: Secondary | ICD-10-CM

## 2016-03-28 DIAGNOSIS — I5030 Unspecified diastolic (congestive) heart failure: Secondary | ICD-10-CM

## 2016-03-28 DIAGNOSIS — R002 Palpitations: Secondary | ICD-10-CM | POA: Diagnosis not present

## 2016-03-28 DIAGNOSIS — R000 Tachycardia, unspecified: Secondary | ICD-10-CM

## 2016-03-28 DIAGNOSIS — I493 Ventricular premature depolarization: Secondary | ICD-10-CM

## 2016-04-13 ENCOUNTER — Other Ambulatory Visit: Payer: Self-pay | Admitting: Cardiovascular Disease

## 2016-04-13 ENCOUNTER — Other Ambulatory Visit: Payer: Self-pay | Admitting: Family Medicine

## 2016-04-13 DIAGNOSIS — N400 Enlarged prostate without lower urinary tract symptoms: Secondary | ICD-10-CM

## 2016-04-18 ENCOUNTER — Telehealth: Payer: Self-pay | Admitting: *Deleted

## 2016-04-18 NOTE — Telephone Encounter (Signed)
-----   Message from Antonieta Ibaimothy J Gollan, MD sent at 04/17/2016  4:48 PM EST ----- Monitor showing short rare runs of SVT, If having sx, Could increase flecainide up to 100 mg twice a day Or do as we talked about on blast visit, take extra flecainide/metoprolol as needed

## 2016-04-18 NOTE — Telephone Encounter (Signed)
Reviewed results and recommendations with patient and he verbalized understanding. He states that he would like to first just try taking the medications as needed and if necessary then he would call back if needed. He verbalized understanding and had no further questions at this time.

## 2016-05-02 DIAGNOSIS — M189 Osteoarthritis of first carpometacarpal joint, unspecified: Secondary | ICD-10-CM | POA: Diagnosis not present

## 2016-06-12 ENCOUNTER — Other Ambulatory Visit: Payer: Self-pay | Admitting: Family Medicine

## 2016-06-12 ENCOUNTER — Ambulatory Visit: Payer: 59 | Admitting: Cardiovascular Disease

## 2016-06-12 DIAGNOSIS — K219 Gastro-esophageal reflux disease without esophagitis: Secondary | ICD-10-CM

## 2016-06-14 ENCOUNTER — Encounter: Payer: Self-pay | Admitting: Family Medicine

## 2016-06-14 ENCOUNTER — Ambulatory Visit (INDEPENDENT_AMBULATORY_CARE_PROVIDER_SITE_OTHER): Payer: 59 | Admitting: Family Medicine

## 2016-06-14 VITALS — BP 136/84 | HR 67 | Temp 97.5°F | Resp 16 | Ht 70.0 in | Wt 193.0 lb

## 2016-06-14 DIAGNOSIS — E785 Hyperlipidemia, unspecified: Secondary | ICD-10-CM

## 2016-06-14 DIAGNOSIS — K219 Gastro-esophageal reflux disease without esophagitis: Secondary | ICD-10-CM

## 2016-06-14 DIAGNOSIS — R739 Hyperglycemia, unspecified: Secondary | ICD-10-CM

## 2016-06-14 DIAGNOSIS — M25512 Pain in left shoulder: Secondary | ICD-10-CM | POA: Diagnosis not present

## 2016-06-14 DIAGNOSIS — G8929 Other chronic pain: Secondary | ICD-10-CM | POA: Diagnosis not present

## 2016-06-14 DIAGNOSIS — N400 Enlarged prostate without lower urinary tract symptoms: Secondary | ICD-10-CM | POA: Diagnosis not present

## 2016-06-14 NOTE — Progress Notes (Signed)
Name: Austin Stewart   MRN: [id number]    DOB: 01-Jan-1956   Date:06/14/2016       Progress Note  Subjective  Chief Complaint  Chief Complaint  Patient presents with  . Hyperlipidemia  . Hyperglycemia  . Bursitis    HPI  Here for f/u of hyperlipidemia and elevated blood sugar.  Has BPH and urine flow is good niow.  Has hx of carde arrhythmia and sees Dr. Mariah Milling for this.   Feels well overall except that L shoulder still has pops and cracks and some pain with ROM.   No problem-specific Assessment & Plan notes found for this encounter.   Past Medical History:  Diagnosis Date  . Allergic rhinitis, cause unspecified   . Allergy   . Cardiac dysrhythmia, unspecified   . Diverticulosis of colon (without mention of hemorrhage)   . Esophageal reflux   . Measles without mention of complication   . Mumps without mention of complication   . Osteoarthrosis, unspecified whether generalized or localized, unspecified site   . 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 Unspecified hyperplasia of prostate without urinary obstruction and other lower urinary tract symptoms (LUTS)     Past Surgical History:  Procedure Laterality Date  . CARDIAC CATHETERIZATION  2006   [facility]   . HERNIA REPAIR    . MITRAL VALVE REPAIR    . rotator cuff repair    . TONSILLECTOMY      Family History  Problem Relation Age of Onset  . Heart disease Mother     s/p stent placement  . Hypertension Mother   . Emphysema Father   . Dementia Father   . Cancer Maternal Uncle     kidney cancer    Social History   Social History  . Marital status: Married    Spouse name: N/A  . Number of children: N/A  . Years of education: N/A   Occupational History  . Not on file.   Social History Main Topics  . Smoking status: Never Smoker  . Smokeless tobacco: Never Used  . Alcohol use No  . Drug use: No  . Sexual activity: Not on file   Other Topics Concern  . Not on file   Social History  Narrative  . No narrative on file     Current Outpatient Prescriptions:  .  aspirin 81 MG tablet, Take 81 mg by mouth daily., Disp: , Rfl:  .  flecainide (TAMBOCOR) 50 MG tablet, TAKE 1 TABLET BY MOUTH 2 TIMES DAILY., Disp: 180 tablet, Rfl: 3 .  fluticasone (FLONASE) 50 MCG/ACT nasal spray, Place 2 sprays into both nostrils daily. Dispense 3 bottles. (Patient taking differently: Place 2 sprays into both nostrils daily as needed. Dispense 3 bottles.), Disp: 16 g, Rfl: 3 .  Glucosamine-Chondroit-Vit C-Mn (GLUCOSAMINE CHONDR 500 COMPLEX PO), Take 500 mg by mouth daily., Disp: , Rfl:  .  loratadine (CLARITIN) 10 MG tablet, Take 1 tablet (10 mg total) by mouth daily. (Patient taking differently: Take 10 mg by mouth daily as needed. ), Disp: 20 tablet, Rfl: 1 .  metoprolol succinate (TOPROL-XL) 25 MG 24 hr tablet, TAKE 1 TABLET BY MOUTH DAILY., Disp: 30 tablet, Rfl: 6 .  montelukast (SINGULAIR) 10 MG tablet, TAKE 1 TABLET (10 MG TOTAL) BY MOUTH AT BEDTIME., Disp: 90 tablet, Rfl: 3 .  omeprazole (PRILOSEC) 20 MG capsule, TAKE 1 CAPSULE BY MOUTH DAILY., Disp: 90 capsule, Rfl: 3 .  sodium chloride (OCEAN) 0.65 % SOLN  nasal spray, Place 1 spray into both nostrils as needed for congestion., Disp: 15 mL, Rfl: 0 .  tamsulosin (FLOMAX) 0.4 MG CAPS capsule, TAKE 1 CAPSULE BY MOUTH DAILY., Disp: 90 capsule, Rfl: 0  Not on File   Review of Systems  Constitutional: Negative for chills, fever, malaise/fatigue and weight loss.  HENT: Negative for hearing loss and tinnitus.   Eyes: Negative for blurred vision and double vision.  Respiratory: Negative for cough, shortness of breath and wheezing.   Cardiovascular: Negative for chest pain, palpitations and leg swelling.  Gastrointestinal: Negative for abdominal pain, blood in stool and heartburn.  Genitourinary: Negative for dysuria, frequency and urgency.  Musculoskeletal: Positive for joint pain (L shoulder). Negative for myalgias.  Skin: Negative for  itching.  Neurological: Negative for dizziness, tingling, tremors, weakness and headaches.      Objective  Vitals:   06/14/16 0812  BP: 136/84  Pulse: 67  Resp: 16  Temp: 97.5 F (36.4 C)  TempSrc: Oral  Weight: 193 lb (87.5 kg)  Height: 5\' 10"  (1.778 m)    Physical Exam  Constitutional: He is oriented to person, place, and time and well-developed, well-nourished, and in no distress. No distress.  HENT:  Head: Normocephalic and atraumatic.  Eyes: Conjunctivae are normal. Pupils are equal, round, and reactive to light. No scleral icterus.  Neck: Normal range of motion. Neck supple. Carotid bruit is not present. No thyromegaly present.  Cardiovascular: Normal rate, regular rhythm and normal heart sounds.  Exam reveals no gallop and no friction rub.   No murmur heard. Pulmonary/Chest: Effort normal and breath sounds normal. No respiratory distress. He has no wheezes. He has no rales.  Abdominal: Soft. Bowel sounds are normal. He exhibits no distension and no mass. There is no tenderness.  Musculoskeletal: He exhibits no edema.  L shoulder with some ant and lateral pain with elevation and elevation against resistance.  Lymphadenopathy:    He has no cervical adenopathy.  Neurological: He is alert and oriented to person, place, and time.  Vitals reviewed.      No results found for this or any previous visit (from the past 2160 hour(s)).   Assessment & Plan  Problem List Items Addressed This Visit      Digestive   GERD without esophagitis - Primary   Relevant Orders   CBC with Differential     Genitourinary   BPH (benign prostatic hyperplasia)   Relevant Orders   PSA     Other   Elevated blood sugar   Relevant Orders   HgB A1c   Elevated lipids   Relevant Orders   COMPLETE METABOLIC PANEL WITH GFR   Lipid Profile    Other Visit Diagnoses    Chronic left shoulder pain       Relevant Orders   Ambulatory referral to Orthopedic Surgery      No orders of  the defined types were placed in this encounter.  1. GERD without esophagitis Cont Omeprazole - CBC with Differential  2. Benign prostatic hyperplasia without lower urinary tract symptoms Cont Tamsulasin - PSA  3. Elevated blood sugar  - HgB A1c  4. Elevated lipids  - COMPLETE METABOLIC PANEL WITH GFR - Lipid Profile  5. Chronic left shoulder pain  - Ambulatory referral to Orthopedic Surgery

## 2016-06-15 ENCOUNTER — Ambulatory Visit: Payer: 59 | Admitting: Family Medicine

## 2016-06-16 ENCOUNTER — Other Ambulatory Visit: Payer: 59

## 2016-06-16 DIAGNOSIS — N400 Enlarged prostate without lower urinary tract symptoms: Secondary | ICD-10-CM | POA: Diagnosis not present

## 2016-06-16 DIAGNOSIS — E785 Hyperlipidemia, unspecified: Secondary | ICD-10-CM | POA: Diagnosis not present

## 2016-06-16 DIAGNOSIS — K219 Gastro-esophageal reflux disease without esophagitis: Secondary | ICD-10-CM | POA: Diagnosis not present

## 2016-06-16 DIAGNOSIS — R739 Hyperglycemia, unspecified: Secondary | ICD-10-CM | POA: Diagnosis not present

## 2016-06-16 LAB — CBC WITH DIFFERENTIAL/PLATELET
BASOS ABS: 63 {cells}/uL (ref 0–200)
BASOS PCT: 1 %
EOS PCT: 3 %
Eosinophils Absolute: 189 cells/uL (ref 15–500)
HCT: 45.3 % (ref 38.5–50.0)
HEMOGLOBIN: 15.1 g/dL (ref 13.2–17.1)
LYMPHS ABS: 1386 {cells}/uL (ref 850–3900)
Lymphocytes Relative: 22 %
MCH: 30.1 pg (ref 27.0–33.0)
MCHC: 33.3 g/dL (ref 32.0–36.0)
MCV: 90.2 fL (ref 80.0–100.0)
MPV: 9.6 fL (ref 7.5–12.5)
Monocytes Absolute: 441 cells/uL (ref 200–950)
Monocytes Relative: 7 %
NEUTROS ABS: 4221 {cells}/uL (ref 1500–7800)
Neutrophils Relative %: 67 %
Platelets: 350 10*3/uL (ref 140–400)
RBC: 5.02 MIL/uL (ref 4.20–5.80)
RDW: 14.5 % (ref 11.0–15.0)
WBC: 6.3 10*3/uL (ref 3.8–10.8)

## 2016-06-17 LAB — COMPLETE METABOLIC PANEL WITH GFR
ALBUMIN: 4.3 g/dL (ref 3.6–5.1)
ALK PHOS: 63 U/L (ref 40–115)
ALT: 25 U/L (ref 9–46)
AST: 22 U/L (ref 10–35)
BUN: 19 mg/dL (ref 7–25)
CO2: 27 mmol/L (ref 20–31)
Calcium: 9.7 mg/dL (ref 8.6–10.3)
Chloride: 105 mmol/L (ref 98–110)
Creat: 1.1 mg/dL (ref 0.70–1.25)
GFR, EST NON AFRICAN AMERICAN: 72 mL/min (ref >=60)
GFR, Est African American: 83 mL/min (ref >=60)
GLUCOSE: 97 mg/dL (ref 65–99)
POTASSIUM: 4.9 mmol/L (ref 3.5–5.3)
SODIUM: 141 mmol/L (ref 135–146)
Total Bilirubin: 0.8 mg/dL (ref 0.2–1.2)
Total Protein: 6.2 g/dL (ref 6.1–8.1)

## 2016-06-17 LAB — HEMOGLOBIN A1C
Hgb A1c MFr Bld: 5.2 % (ref <5.7)
Mean Plasma Glucose: 103 mg/dL

## 2016-06-17 LAB — LIPID PANEL
CHOL/HDL RATIO: 2.9 ratio (ref <5.0)
CHOLESTEROL: 138 mg/dL (ref <200)
HDL: 48 mg/dL (ref >40)
LDL Cholesterol: 71 mg/dL (ref <100)
TRIGLYCERIDES: 97 mg/dL (ref <150)
VLDL: 19 mg/dL (ref <30)

## 2016-06-17 LAB — PSA: PSA: 0.4 ng/mL (ref <=4.0)

## 2016-06-19 ENCOUNTER — Ambulatory Visit (INDEPENDENT_AMBULATORY_CARE_PROVIDER_SITE_OTHER): Payer: 59 | Admitting: Cardiovascular Disease

## 2016-06-19 ENCOUNTER — Encounter: Payer: Self-pay | Admitting: Cardiovascular Disease

## 2016-06-19 VITALS — BP 108/72 | HR 62 | Ht 70.0 in | Wt 191.5 lb

## 2016-06-19 DIAGNOSIS — Z9889 Other specified postprocedural states: Secondary | ICD-10-CM

## 2016-06-19 DIAGNOSIS — E785 Hyperlipidemia, unspecified: Secondary | ICD-10-CM

## 2016-06-19 DIAGNOSIS — R002 Palpitations: Secondary | ICD-10-CM | POA: Diagnosis not present

## 2016-06-19 DIAGNOSIS — R7303 Prediabetes: Secondary | ICD-10-CM

## 2016-06-19 DIAGNOSIS — I471 Supraventricular tachycardia: Secondary | ICD-10-CM

## 2016-06-19 DIAGNOSIS — I5030 Unspecified diastolic (congestive) heart failure: Secondary | ICD-10-CM

## 2016-06-19 NOTE — Patient Instructions (Signed)

## 2016-06-19 NOTE — Progress Notes (Signed)
Cardiology Office Note  Date:  06/19/2016   ID:  Austin MarrowRobert D Stewart, DOB 05/08/1955, MRN [id number]  PCP:  Fidel LevyJames Hawkins Jr, MD   Chief Complaint  Patient presents with  . other    F/u event monitor. Meds reviewed verbally with pt.    HPI:  Austin Stewart is a 61 year old gentleman, patient of Dr. Juanetta GoslingHawkins, history of significant mitral valve regurgitation (torn leaflet? by his report) with associated shortness of breath, [facility] with maze procedure at that time, shoulder surgery March 2013 canceled secondary to arrhythmia, likely PACs with followup echocardiogram and stress test showing no ischemia by report, shoulder surgery April 2013 who presents for routine followup of his arrhythmia. He is taking flecainide presumably for ectopy. This was started by prior cardiologist.  CT scan March 2017 Coronary calcium score of 130. This was 4264 percentile for age and sex matched control.  He was started on Lipitor 10 mg daily, reported having hives, stopped the medication Cholesterol dropped from 168 down to 93 on Lipitor in May 2017  In follow-up today he reports that he is doing well, has changed his diet, less suite tea, bread, potato. Trying to exercise, Denies any chest pain No significant tachycardia concerning for SVT  Event monitor reviewed with him showing rare episodes of SVT ranging from 4 beats up to 19 beats, 12 episodes in total over 2 week.  He continues on flecainide 50 mill grams twice a day with metoprolol 12.5 mg in the morning with good symptom control lab work reviewed with him Takes Lasix as needed  Total 138, LDL 71 HBa1C 5.2  EKG on today's visit shows normal sinus rhythm with rate 62 bpm, no significant ST or T-wave changes. Previous EKG with PVCs  Other past medical history reviewed  Father passed away earlier in 2017, lived in [place]  echocardiogram in 2013 which showed well functioning mitral valve.  Holter monitor in 2013.  He does not know what that showed.   cholesterol has not been a problem for the patient. No significant family history of coronary artery disease. Nonsmoker. no coronary artery disease seen on previous cardiac catheterization in 2006 He was started on flecainide 50 mg twice a day for ectopy in 2013.  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  PSH:    Past Surgical History:  Procedure Laterality Date  . CARDIAC CATHETERIZATION  2006   [facility]   . HERNIA REPAIR    . MITRAL VALVE REPAIR    . rotator cuff repair    . TONSILLECTOMY      Current Outpatient Prescriptions  Medication Sig Dispense Refill  . aspirin 81 MG tablet Take 81 mg by mouth daily.    . flecainide (TAMBOCOR) 50 MG tablet TAKE 1 TABLET BY MOUTH 2 TIMES DAILY. 180 tablet 3  . fluticasone (FLONASE) 50 MCG/ACT nasal spray Place 2 sprays into both nostrils daily. Dispense 3 bottles. (Patient taking differently: Place 2 sprays into both nostrils daily as needed. Dispense 3 bottles.) 16 g 3  . Glucosamine-Chondroit-Vit C-Mn (GLUCOSAMINE CHONDR 500 COMPLEX PO) Take 500 mg by mouth daily.    .Marland Kitchen  loratadine (CLARITIN) 10 MG tablet Take 1 tablet (10 mg total) by mouth daily. (Patient taking differently: Take 10 mg by mouth daily as needed. ) 20 tablet 1  . metoprolol succinate (TOPROL-XL) 25 MG 24 hr tablet TAKE 1 TABLET BY MOUTH DAILY. 30 tablet 6  . montelukast (SINGULAIR) 10 MG tablet TAKE 1 TABLET (10 MG TOTAL) BY MOUTH AT BEDTIME. 90 tablet 3  . omeprazole  (PRILOSEC) 20 MG capsule TAKE 1 CAPSULE BY MOUTH DAILY. 90 capsule 3  . sodium chloride (OCEAN) 0.65 % SOLN nasal spray Place 1 spray into both nostrils as needed for congestion. 15 mL 0  . tamsulosin (FLOMAX) 0.4 MG CAPS capsule TAKE 1 CAPSULE BY MOUTH DAILY. 90 capsule 0   No current facility-administered medications for this visit.      Allergies:   Patient has no allergy information on record.   Social History:  The patient  reports that he has never smoked. He has never used smokeless tobacco. He reports that he does not drink alcohol or use drugs.   Family History:   family history includes Cancer in his maternal uncle; Dementia in his father; Emphysema in his father; Heart disease in his mother; Hypertension in his mother.    Review of Systems: Review of Systems  Constitutional: Negative.   Respiratory: Negative.   Cardiovascular: Negative.   Gastrointestinal: Negative.   Musculoskeletal: Negative.   Neurological: Negative.   Psychiatric/Behavioral: Negative.   All other systems reviewed and are negative.    PHYSICAL EXAM: VS:  BP 108/72 (BP Location: Left Arm, Patient Position: Sitting, Cuff Size: Normal)   Pulse 62   Ht 5\' 10"  (1.778 m)   Wt 191 lb 8 oz (86.9 kg)   BMI 27.48 kg/m  , BMI Body mass index is 27.48 kg/m. GEN: Well nourished, well developed, in no acute distress  HEENT: normal  Neck: no JVD, carotid bruits, or masses Cardiac: RRR; no murmurs, rubs, or gallops,no edema  Respiratory:  clear to auscultation bilaterally, normal work of breathing GI: soft, nontender, nondistended, + BS MS: no deformity or atrophy  Skin: warm and dry, no rash Neuro:  Strength and sensation are intact Psych: euthymic mood, full affect    Recent Labs: 06/16/2016: ALT 25; BUN 19; Creat 1.10; Hemoglobin 15.1; Platelets 350; Potassium 4.9; Sodium 141    Lipid Panel Lab Results  Component Value Date   CHOL 138 06/16/2016   HDL 48 06/16/2016   LDLCALC 71 06/16/2016    TRIG 97 06/16/2016      Wt Readings from Last 3 Encounters:  06/19/16 191 lb 8 oz (86.9 kg)  06/14/16 193 lb (87.5 kg)  03/27/16 189 lb (85.7 kg)      ASSESSMENT AND PLAN:  Diastolic congestive heart failure, unspecified congestive heart failure chronicity (HCC) - Plan: EKG 12-Lead Appears euvolemic on today's visit, rarely takes Lasix  H/O mitral valve repair - Plan: EKG 12-Lead No significant murmur on exam, no leg edema,  No new symptoms of shortness of breath  SVT/Heart palpitations - Plan: EKG 12-Lead Taking  flecainide with good symptom relief Recommended he take extra flecainide and metoprolol for breakthrough arrhythmia  Elevated lipids Improvement in his cholesterol with changing his diet, decreasing sugar intake  Prediabetes  Hemoglobin A1c down to 5.2   Total encounter time more than 15 minutes  Greater than 50% was spent in counseling and coordination of care with the patient   Disposition:   F/U  12 months   Orders Placed  This Encounter  Procedures  . EKG 12-Lead     Signed, Dossie Arbour, M.D., Ph.D. 06/19/2016  [facility] [place], [place] [phone]

## 2016-06-20 ENCOUNTER — Other Ambulatory Visit: Payer: Self-pay | Admitting: Cardiovascular Disease

## 2016-06-20 ENCOUNTER — Other Ambulatory Visit: Payer: Self-pay | Admitting: Family Medicine

## 2016-06-20 DIAGNOSIS — N400 Enlarged prostate without lower urinary tract symptoms: Secondary | ICD-10-CM

## 2016-06-28 DIAGNOSIS — M25512 Pain in left shoulder: Secondary | ICD-10-CM | POA: Diagnosis not present

## 2016-06-30 ENCOUNTER — Other Ambulatory Visit: Payer: Self-pay | Admitting: Orthopedic Surgery

## 2016-06-30 DIAGNOSIS — M25512 Pain in left shoulder: Secondary | ICD-10-CM

## 2016-07-05 ENCOUNTER — Ambulatory Visit: Payer: Self-pay | Admitting: Physician Assistant

## 2016-07-05 ENCOUNTER — Encounter: Payer: Self-pay | Admitting: Physician Assistant

## 2016-07-05 VITALS — BP 120/84 | HR 67 | Temp 98.3°F

## 2016-07-05 DIAGNOSIS — J0100 Acute maxillary sinusitis, unspecified: Secondary | ICD-10-CM

## 2016-07-05 MED ORDER — AMOXICILLIN 875 MG PO TABS: 875.0000 mg | ORAL_TABLET | Freq: Two times a day (BID) | ORAL | 0 refills | Status: DC

## 2016-07-05 NOTE — Progress Notes (Signed)
S: C/o runny nose and congestion for 2 days, no fever, chills, cp/sob, v/d; mucus is green and thick, cough is sporadic, c/o of facial and dental pain.   Using otc meds: mucinex  O: PE: vitals wnl, nad, perrl eomi, normocephalic, tms dull, nasal mucosa red and swollen, throat injected, neck supple no lymph, lungs c t a, cv rrr, neuro intact  A:  Acute sinusitis   P: drink fluids, continue regular meds , use otc meds of choice, return if not improving in 5 days, return earlier if worsening , amoxil 875mg  bid, nasal saline rinse bid

## 2016-07-13 ENCOUNTER — Ambulatory Visit: Payer: 59

## 2016-07-25 ENCOUNTER — Other Ambulatory Visit: Payer: Self-pay | Admitting: Orthopedic Surgery

## 2016-07-25 ENCOUNTER — Ambulatory Visit
Admission: RE | Admit: 2016-07-25 | Discharge: 2016-07-25 | Disposition: A | Discharge status: Home / Self Care | Payer: 59 | Source: Ambulatory Visit | Attending: Orthopedic Surgery | Admitting: Orthopedic Surgery

## 2016-07-25 DIAGNOSIS — M25512 Pain in left shoulder: Secondary | ICD-10-CM

## 2016-07-25 DIAGNOSIS — M659 Synovitis and tenosynovitis, unspecified: Secondary | ICD-10-CM | POA: Diagnosis not present

## 2016-07-25 DIAGNOSIS — M75102 Unspecified rotator cuff tear or rupture of left shoulder, not specified as traumatic: Secondary | ICD-10-CM | POA: Insufficient documentation

## 2016-07-25 DIAGNOSIS — M6250 Muscle wasting and atrophy, not elsewhere classified, unspecified site: Secondary | ICD-10-CM | POA: Insufficient documentation

## 2016-07-28 DIAGNOSIS — M75122 Complete rotator cuff tear or rupture of left shoulder, not specified as traumatic: Secondary | ICD-10-CM | POA: Diagnosis not present

## 2016-07-31 ENCOUNTER — Telehealth: Payer: Self-pay | Admitting: Cardiovascular Disease

## 2016-07-31 NOTE — Telephone Encounter (Signed)
Acceptable risk for shoulder surgery No further testing needed

## 2016-07-31 NOTE — Telephone Encounter (Signed)
Received cardiac clearance request for pt to proceed w/ left shoulder arthroscopy rotator cuff w/ Dr. Juanell Fairly on 08/24/16.   Please route clearance to Austin Stewart @ [phone].

## 2016-08-01 NOTE — Telephone Encounter (Signed)
Routed to fax # provided. 

## 2016-08-01 NOTE — Telephone Encounter (Signed)
All pages of fax not received.  Please send again to [phone]  This is direct fax.

## 2016-08-02 ENCOUNTER — Other Ambulatory Visit: Payer: Self-pay | Admitting: Orthopedic Surgery

## 2016-08-17 ENCOUNTER — Encounter
Admission: RE | Admit: 2016-08-17 | Discharge: 2016-08-17 | Disposition: A | Discharge status: Home / Self Care | Payer: 59 | Source: Ambulatory Visit | Attending: Orthopedic Surgery | Admitting: Orthopedic Surgery

## 2016-08-17 HISTORY — DX: Cardiac arrhythmia, unspecified: I49.9

## 2016-08-17 NOTE — Patient Instructions (Signed)
  Your procedure is scheduled on: 08/24/16 Report to Day Surgery.[facility]OND [facility]LOOR To find out your arrival time please call [phone] between 1PM - 3PM on 08/23/16.  Remember: Instructions that are not followed completely may result in serious medical risk, up to and including death, or upon the discretion of your surgeon and anesthesiologist your surgery may need to be rescheduled.    _X___ 1. Do not eat food or drink liquids after midnight. No gum chewing or hard candies.     ____ 2. No Alcohol for 24 hours before or after surgery.   ____ 3. Do Not Smoke For 24 Hours Prior to Your Surgery.   ____ 4. Bring all medications with you on the day of surgery if instructed.    ___X_ 5. Notify your doctor if there is any change in your medical condition     (cold, fever, infections).       Do not wear jewelry, make-up, hairpins, clips or nail polish.  Do not wear lotions, powders, or perfumes. You may wear deodorant.  Do not shave 48 hours prior to surgery. Men may shave face and neck.  Do not bring valuables to the hospital.    [facility] is not responsible for any belongings or valuables.               Contacts, dentures or bridgework may not be worn into surgery.  Leave your suitcase in the car. After surgery it may be brought to your room.  For patients admitted to the hospital, discharge time is determined by your                treatment team.   Patients discharged the day of surgery will not be allowed to drive home.   Please read over the following fact sheets that you were given:   Surgical Site Infection Prevention   __X__ Take these medicines the morning of surgery with A SIP OF WATER:    1. OMEPRAZOLE  2. METOPROLOL  3. FLECAINIDE  4. FLOMAX  5.  6.  ____ Fleet Enema (as directed)   ___X_ Use CHG Soap as directed  ____ Use inhalers on the day of surgery  ____ Stop metformin 2 days prior to surgery    ____ Take 1/2 of usual insulin dose the night  before surgery and none on the morning of surgery.   __X__ Stop Coumadin/Plavix/aspirin on    STOP ASPIRIN 5 DAYS BEFORE SURGERY  ____ Stop Anti-inflammatories on    __X__ Stop supplements until after surgery.   STOP TUMERIC UNTIL AFTER SURGERY  ____ Bring C-Pap to the hospital.

## 2016-08-17 NOTE — Pre-Procedure Instructions (Signed)
CLEARED BY DR Concha Se 07/31/16

## 2016-08-21 ENCOUNTER — Ambulatory Visit: Payer: 59

## 2016-08-21 ENCOUNTER — Encounter: Payer: Self-pay | Admitting: Podiatry

## 2016-08-21 ENCOUNTER — Ambulatory Visit (INDEPENDENT_AMBULATORY_CARE_PROVIDER_SITE_OTHER): Payer: 59 | Admitting: Podiatry

## 2016-08-21 ENCOUNTER — Encounter
Admission: RE | Admit: 2016-08-21 | Discharge: 2016-08-21 | Disposition: A | Discharge status: Home / Self Care | Payer: 59 | Source: Ambulatory Visit | Attending: Orthopedic Surgery | Admitting: Orthopedic Surgery

## 2016-08-21 DIAGNOSIS — I499 Cardiac arrhythmia, unspecified: Secondary | ICD-10-CM | POA: Diagnosis not present

## 2016-08-21 DIAGNOSIS — Z9889 Other specified postprocedural states: Secondary | ICD-10-CM | POA: Diagnosis not present

## 2016-08-21 DIAGNOSIS — M7502 Adhesive capsulitis of left shoulder: Secondary | ICD-10-CM | POA: Diagnosis not present

## 2016-08-21 DIAGNOSIS — M79671 Pain in right foot: Secondary | ICD-10-CM

## 2016-08-21 DIAGNOSIS — Q828 Other specified congenital malformations of skin: Secondary | ICD-10-CM | POA: Diagnosis not present

## 2016-08-21 DIAGNOSIS — M1990 Unspecified osteoarthritis, unspecified site: Secondary | ICD-10-CM | POA: Diagnosis not present

## 2016-08-21 DIAGNOSIS — I480 Paroxysmal atrial fibrillation: Secondary | ICD-10-CM | POA: Diagnosis not present

## 2016-08-21 DIAGNOSIS — M75122 Complete rotator cuff tear or rupture of left shoulder, not specified as traumatic: Secondary | ICD-10-CM | POA: Diagnosis not present

## 2016-08-21 DIAGNOSIS — Z7951 Long term (current) use of inhaled steroids: Secondary | ICD-10-CM | POA: Diagnosis not present

## 2016-08-21 DIAGNOSIS — Z79899 Other long term (current) drug therapy: Secondary | ICD-10-CM | POA: Diagnosis not present

## 2016-08-21 DIAGNOSIS — N400 Enlarged prostate without lower urinary tract symptoms: Secondary | ICD-10-CM | POA: Diagnosis not present

## 2016-08-21 DIAGNOSIS — M2041 Other hammer toe(s) (acquired), right foot: Secondary | ICD-10-CM

## 2016-08-21 DIAGNOSIS — Z8249 Family history of ischemic heart disease and other diseases of the circulatory system: Secondary | ICD-10-CM | POA: Diagnosis not present

## 2016-08-21 DIAGNOSIS — M779 Enthesopathy, unspecified: Secondary | ICD-10-CM

## 2016-08-21 DIAGNOSIS — M7751 Other enthesopathy of right foot: Secondary | ICD-10-CM

## 2016-08-21 DIAGNOSIS — M778 Other enthesopathies, not elsewhere classified: Secondary | ICD-10-CM

## 2016-08-21 DIAGNOSIS — Z7982 Long term (current) use of aspirin: Secondary | ICD-10-CM | POA: Diagnosis not present

## 2016-08-21 DIAGNOSIS — K219 Gastro-esophageal reflux disease without esophagitis: Secondary | ICD-10-CM | POA: Diagnosis not present

## 2016-08-21 DIAGNOSIS — M7552 Bursitis of left shoulder: Secondary | ICD-10-CM | POA: Diagnosis not present

## 2016-08-21 DIAGNOSIS — Z8261 Family history of arthritis: Secondary | ICD-10-CM | POA: Diagnosis not present

## 2016-08-21 DIAGNOSIS — J309 Allergic rhinitis, unspecified: Secondary | ICD-10-CM | POA: Diagnosis not present

## 2016-08-21 LAB — BASIC METABOLIC PANEL
Anion gap: 8 (ref 5–15)
BUN: 15 mg/dL (ref 6–20)
CALCIUM: 9.1 mg/dL (ref 8.9–10.3)
CO2: 24 mmol/L (ref 22–32)
CREATININE: 1.02 mg/dL (ref 0.61–1.24)
Chloride: 107 mmol/L (ref 101–111)
GFR calc Af Amer: >60 mL/min (ref >60)
GFR calc non Af Amer: >60 mL/min (ref >60)
GLUCOSE: 117 mg/dL — AB (ref 65–99)
POTASSIUM: 3.7 mmol/L (ref 3.5–5.1)
SODIUM: 139 mmol/L (ref 135–145)

## 2016-08-21 LAB — CBC WITH DIFFERENTIAL/PLATELET
BASOS ABS: 0.1 10*3/uL (ref 0–0.1)
Basophils Relative: 1 %
EOS PCT: 3 %
Eosinophils Absolute: 0.2 10*3/uL (ref 0–0.7)
HCT: 43.3 % (ref 40.0–52.0)
HEMOGLOBIN: 15 g/dL (ref 13.0–18.0)
LYMPHS ABS: 1.8 10*3/uL (ref 1.0–3.6)
LYMPHS PCT: 22 %
MCH: 30.3 pg (ref 26.0–34.0)
MCHC: 34.6 g/dL (ref 32.0–36.0)
MCV: 87.6 fL (ref 80.0–100.0)
Monocytes Absolute: 0.6 10*3/uL (ref 0.2–1.0)
Monocytes Relative: 7 %
NEUTROS PCT: 67 %
Neutro Abs: 5.6 10*3/uL (ref 1.4–6.5)
PLATELETS: 372 10*3/uL (ref 150–440)
RBC: 4.95 MIL/uL (ref 4.40–5.90)
RDW: 13.8 % (ref 11.5–14.5)
WBC: 8.4 10*3/uL (ref 3.8–10.6)

## 2016-08-21 LAB — PROTIME-INR
INR: 0.97
PROTHROMBIN TIME: 12.9 s (ref 11.4–15.2)

## 2016-08-21 LAB — APTT: APTT: 27 s (ref 24–36)

## 2016-08-21 NOTE — Progress Notes (Signed)
   Subjective:    Patient ID: Austin Stewart, male    DOB: [birth year], 61 y.o.   MRN: [id number]  HPI: He presents today with chief complaint of a painful third toe of the right foot. States it is been bothering for quite a while now he states they get a lot of burning particularly over the last few days he was really red and I tried using a Band-Aid but nothing seems to help. States he had bunion correction in 2004 at [place] which really did not help the manner at all. He states the toe leaned laterally compressing the second and third toes together.  Review of Systems  Genitourinary: Positive for difficulty urinating and urgency.  All other systems reviewed and are negative.      Objective:   Physical Exam: Vital signs are stable alert and oriented 3. Pulses are palpable. Neurologic sensory was intact. Degenerative flexor intact. Muscle strength is normal. Orthopedic evaluation of his right hallux abductovalgus deformity with the juxtaposition of the hallux crowding the second third toes. Reactive hyperkeratotic lesion is noted on the medial aspect of the third digit right foot. This area has an overlying bursitis and no hyperkeratotic lesion.        Assessment & Plan:  Bursitis capsulitis of the third toe right foot. 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  Plan: I injected the third toe today placed padding. Discussed surgical intervention regarding his first metatarsophalangeal joint. Radiographs will be necessary next visit.

## 2016-08-23 MED ORDER — CEFAZOLIN SODIUM-DEXTROSE 2-4 GM/100ML-% IV SOLN
2.0000 g | INTRAVENOUS | Status: AC
  Administered 2016-08-24: 2 g via INTRAVENOUS

## 2016-08-24 ENCOUNTER — Ambulatory Visit: Payer: 59 | Admitting: Certified Registered Nurse Anesthetist

## 2016-08-24 ENCOUNTER — Encounter: Payer: Self-pay | Admitting: *Deleted

## 2016-08-24 ENCOUNTER — Encounter
Admission: RE | Disposition: A | Discharge status: Home / Self Care | Payer: Self-pay | Source: Ambulatory Visit | Attending: Orthopedic Surgery

## 2016-08-24 ENCOUNTER — Ambulatory Visit
Admission: RE | Admit: 2016-08-24 | Discharge: 2016-08-24 | Disposition: A | Discharge status: Home / Self Care | Payer: 59 | Source: Ambulatory Visit | Attending: Orthopedic Surgery | Admitting: Orthopedic Surgery

## 2016-08-24 DIAGNOSIS — Z9889 Other specified postprocedural states: Secondary | ICD-10-CM | POA: Insufficient documentation

## 2016-08-24 DIAGNOSIS — M7552 Bursitis of left shoulder: Secondary | ICD-10-CM | POA: Diagnosis not present

## 2016-08-24 DIAGNOSIS — Z79899 Other long term (current) drug therapy: Secondary | ICD-10-CM | POA: Diagnosis not present

## 2016-08-24 DIAGNOSIS — M25512 Pain in left shoulder: Secondary | ICD-10-CM | POA: Diagnosis not present

## 2016-08-24 DIAGNOSIS — I480 Paroxysmal atrial fibrillation: Secondary | ICD-10-CM | POA: Insufficient documentation

## 2016-08-24 DIAGNOSIS — M75122 Complete rotator cuff tear or rupture of left shoulder, not specified as traumatic: Secondary | ICD-10-CM | POA: Diagnosis not present

## 2016-08-24 DIAGNOSIS — Z7951 Long term (current) use of inhaled steroids: Secondary | ICD-10-CM | POA: Insufficient documentation

## 2016-08-24 DIAGNOSIS — Z8261 Family history of arthritis: Secondary | ICD-10-CM | POA: Insufficient documentation

## 2016-08-24 DIAGNOSIS — Z8249 Family history of ischemic heart disease and other diseases of the circulatory system: Secondary | ICD-10-CM | POA: Insufficient documentation

## 2016-08-24 DIAGNOSIS — M1990 Unspecified osteoarthritis, unspecified site: Secondary | ICD-10-CM | POA: Insufficient documentation

## 2016-08-24 DIAGNOSIS — Z7982 Long term (current) use of aspirin: Secondary | ICD-10-CM | POA: Diagnosis not present

## 2016-08-24 DIAGNOSIS — K219 Gastro-esophageal reflux disease without esophagitis: Secondary | ICD-10-CM | POA: Insufficient documentation

## 2016-08-24 DIAGNOSIS — M19011 Primary osteoarthritis, right shoulder: Secondary | ICD-10-CM | POA: Diagnosis not present

## 2016-08-24 DIAGNOSIS — M7502 Adhesive capsulitis of left shoulder: Secondary | ICD-10-CM | POA: Insufficient documentation

## 2016-08-24 DIAGNOSIS — I499 Cardiac arrhythmia, unspecified: Secondary | ICD-10-CM | POA: Insufficient documentation

## 2016-08-24 DIAGNOSIS — J309 Allergic rhinitis, unspecified: Secondary | ICD-10-CM | POA: Insufficient documentation

## 2016-08-24 DIAGNOSIS — N400 Enlarged prostate without lower urinary tract symptoms: Secondary | ICD-10-CM | POA: Insufficient documentation

## 2016-08-24 DIAGNOSIS — G8918 Other acute postprocedural pain: Secondary | ICD-10-CM | POA: Diagnosis not present

## 2016-08-24 HISTORY — PX: SHOULDER ARTHROSCOPY: SHX128

## 2016-08-24 SURGERY — ARTHROSCOPY, SHOULDER
Anesthesia: General | Site: Shoulder | Laterality: Left | Wound class: Clean

## 2016-08-24 MED ORDER — LIDOCAINE HCL (PF) 1 % IJ SOLN
INTRAMUSCULAR | Status: DC | PRN
  Administered 2016-08-24: 3 mL

## 2016-08-24 MED ORDER — OXYCODONE HCL 5 MG/5ML PO SOLN: 5.0000 mg | Freq: Once | ORAL | Status: DC | PRN

## 2016-08-24 MED ORDER — ROCURONIUM BROMIDE 100 MG/10ML IV SOLN
INTRAVENOUS | Status: DC | PRN
  Administered 2016-08-24: 5 mg via INTRAVENOUS
  Administered 2016-08-24: 30 mg via INTRAVENOUS
  Administered 2016-08-24: 10 mg via INTRAVENOUS

## 2016-08-24 MED ORDER — ACETAMINOPHEN 10 MG/ML IV SOLN
INTRAVENOUS | Status: AC
  Filled 2016-08-24: qty 100

## 2016-08-24 MED ORDER — LIDOCAINE HCL (PF) 1 % IJ SOLN
INTRAMUSCULAR | Status: AC
  Filled 2016-08-24: qty 30

## 2016-08-24 MED ORDER — ROCURONIUM BROMIDE 50 MG/5ML IV SOLN
INTRAVENOUS | Status: AC
  Filled 2016-08-24: qty 1

## 2016-08-24 MED ORDER — EPINEPHRINE 30 MG/30ML IJ SOLN
INTRAMUSCULAR | Status: AC
  Filled 2016-08-24: qty 1

## 2016-08-24 MED ORDER — OXYCODONE HCL 5 MG PO TABS: 5.0000 mg | ORAL_TABLET | Freq: Once | ORAL | Status: DC | PRN

## 2016-08-24 MED ORDER — PROPOFOL 10 MG/ML IV BOLUS
INTRAVENOUS | Status: DC | PRN
  Administered 2016-08-24: 170 mg via INTRAVENOUS

## 2016-08-24 MED ORDER — LIDOCAINE HCL (PF) 2 % IJ SOLN
INTRAMUSCULAR | Status: AC
  Filled 2016-08-24: qty 2

## 2016-08-24 MED ORDER — PHENYLEPHRINE HCL 10 MG/ML IJ SOLN
INTRAVENOUS | Status: DC | PRN
  Administered 2016-08-24: 30 ug/min via INTRAVENOUS

## 2016-08-24 MED ORDER — SUGAMMADEX SODIUM 200 MG/2ML IV SOLN
INTRAVENOUS | Status: DC | PRN
  Administered 2016-08-24: 175 mg via INTRAVENOUS

## 2016-08-24 MED ORDER — BUPIVACAINE HCL 0.25 % IJ SOLN
INTRAMUSCULAR | Status: DC | PRN
  Administered 2016-08-24: 30 mL

## 2016-08-24 MED ORDER — PROPOFOL 10 MG/ML IV BOLUS
INTRAVENOUS | Status: AC
  Filled 2016-08-24: qty 20

## 2016-08-24 MED ORDER — MIDAZOLAM HCL 2 MG/2ML IJ SOLN
2.0000 mg | Freq: Once | INTRAMUSCULAR | Status: AC
  Administered 2016-08-24: 2 mg via INTRAVENOUS

## 2016-08-24 MED ORDER — SUCCINYLCHOLINE CHLORIDE 20 MG/ML IJ SOLN
INTRAMUSCULAR | Status: DC | PRN
  Administered 2016-08-24: 120 mg via INTRAVENOUS

## 2016-08-24 MED ORDER — FENTANYL CITRATE (PF) 100 MCG/2ML IJ SOLN: 25.0000 ug | INTRAMUSCULAR | Status: DC | PRN

## 2016-08-24 MED ORDER — SUCCINYLCHOLINE CHLORIDE 20 MG/ML IJ SOLN
INTRAMUSCULAR | Status: AC
  Filled 2016-08-24: qty 1

## 2016-08-24 MED ORDER — PROMETHAZINE HCL 25 MG/ML IJ SOLN: 6.2500 mg | INTRAMUSCULAR | Status: DC | PRN

## 2016-08-24 MED ORDER — LIDOCAINE HCL (CARDIAC) 20 MG/ML IV SOLN
INTRAVENOUS | Status: DC | PRN
  Administered 2016-08-24: 80 mg via INTRAVENOUS

## 2016-08-24 MED ORDER — EPHEDRINE SULFATE 50 MG/ML IJ SOLN
INTRAMUSCULAR | Status: DC | PRN
  Administered 2016-08-24 (×2): 5 mg via INTRAVENOUS
  Administered 2016-08-24: 10 mg via INTRAVENOUS

## 2016-08-24 MED ORDER — ROPIVACAINE HCL 5 MG/ML IJ SOLN
INTRAMUSCULAR | Status: AC
  Filled 2016-08-24: qty 60

## 2016-08-24 MED ORDER — LIDOCAINE HCL (PF) 1 % IJ SOLN
INTRAMUSCULAR | Status: AC
  Filled 2016-08-24: qty 5

## 2016-08-24 MED ORDER — CEFAZOLIN SODIUM-DEXTROSE 2-4 GM/100ML-% IV SOLN
INTRAVENOUS | Status: AC
  Filled 2016-08-24: qty 100

## 2016-08-24 MED ORDER — ROPIVACAINE HCL 5 MG/ML IJ SOLN
INTRAMUSCULAR | Status: DC | PRN
  Administered 2016-08-24: 30 mL via PERINEURAL

## 2016-08-24 MED ORDER — DEXAMETHASONE SODIUM PHOSPHATE 10 MG/ML IJ SOLN
INTRAMUSCULAR | Status: DC | PRN
  Administered 2016-08-24: 10 mg via INTRAVENOUS

## 2016-08-24 MED ORDER — NEOMYCIN-POLYMYXIN B GU 40-200000 IR SOLN
Status: AC
  Filled 2016-08-24: qty 2

## 2016-08-24 MED ORDER — MIDAZOLAM HCL 2 MG/2ML IJ SOLN
INTRAMUSCULAR | Status: AC
  Filled 2016-08-24: qty 2

## 2016-08-24 MED ORDER — MEPERIDINE HCL 50 MG/ML IJ SOLN: 6.2500 mg | INTRAMUSCULAR | Status: DC | PRN

## 2016-08-24 MED ORDER — LACTATED RINGERS IV SOLN
INTRAVENOUS | Status: DC
  Administered 2016-08-24: 07:00:00 via INTRAVENOUS

## 2016-08-24 MED ORDER — PHENYLEPHRINE HCL 10 MG/ML IJ SOLN
INTRAMUSCULAR | Status: DC | PRN
  Administered 2016-08-24: 100 ug via INTRAVENOUS
  Administered 2016-08-24: 200 ug via INTRAVENOUS
  Administered 2016-08-24: 300 ug via INTRAVENOUS
  Administered 2016-08-24: 100 ug via INTRAVENOUS
  Administered 2016-08-24: 300 ug via INTRAVENOUS

## 2016-08-24 MED ORDER — LIDOCAINE HCL (PF) 1 % IJ SOLN
INTRAMUSCULAR | Status: DC | PRN
  Administered 2016-08-24: 10 mL

## 2016-08-24 MED ORDER — NEOMYCIN-POLYMYXIN B GU 40-200000 IR SOLN
Status: DC | PRN
  Administered 2016-08-24: 2 mL

## 2016-08-24 MED ORDER — OXYCODONE HCL 5 MG PO TABS: 5.0000 mg | ORAL_TABLET | ORAL | 0 refills | Status: DC | PRN

## 2016-08-24 MED ORDER — FENTANYL CITRATE (PF) 100 MCG/2ML IJ SOLN
INTRAMUSCULAR | Status: AC
  Filled 2016-08-24: qty 2

## 2016-08-24 MED ORDER — SEVOFLURANE IN SOLN
RESPIRATORY_TRACT | Status: AC
  Filled 2016-08-24: qty 250

## 2016-08-24 MED ORDER — ONDANSETRON HCL 4 MG PO TABS: 4.0000 mg | ORAL_TABLET | Freq: Three times a day (TID) | ORAL | 0 refills | Status: DC | PRN

## 2016-08-24 MED ORDER — EPHEDRINE SULFATE 50 MG/ML IJ SOLN
INTRAMUSCULAR | Status: AC
  Filled 2016-08-24: qty 1

## 2016-08-24 MED ORDER — EPINEPHRINE PF 1 MG/ML IJ SOLN
INTRAMUSCULAR | Status: DC | PRN
  Administered 2016-08-24: 1 mg

## 2016-08-24 MED ORDER — CHLORHEXIDINE GLUCONATE CLOTH 2 % EX PADS
6.0000 | MEDICATED_PAD | Freq: Once | CUTANEOUS | Status: AC
Start: 2016-08-24 — End: 2016-08-24
  Administered 2016-08-24: 6 via TOPICAL

## 2016-08-24 MED ORDER — PHENYLEPHRINE HCL 10 MG/ML IJ SOLN
INTRAMUSCULAR | Status: AC
  Filled 2016-08-24: qty 1

## 2016-08-24 MED ORDER — MIDAZOLAM HCL 2 MG/2ML IJ SOLN
INTRAMUSCULAR | Status: AC
  Administered 2016-08-24: 2 mg via INTRAVENOUS
  Filled 2016-08-24: qty 2

## 2016-08-24 MED ORDER — DEXAMETHASONE SODIUM PHOSPHATE 10 MG/ML IJ SOLN
INTRAMUSCULAR | Status: AC
  Filled 2016-08-24: qty 1

## 2016-08-24 MED ORDER — MIDAZOLAM HCL 2 MG/2ML IJ SOLN
INTRAMUSCULAR | Status: DC | PRN
  Administered 2016-08-24: 1 mg via INTRAVENOUS

## 2016-08-24 MED ORDER — CHLORHEXIDINE GLUCONATE CLOTH 2 % EX PADS
6.0000 | MEDICATED_PAD | Freq: Once | CUTANEOUS | Status: AC
  Administered 2016-08-24: 6 via TOPICAL

## 2016-08-24 MED ORDER — ACETAMINOPHEN 10 MG/ML IV SOLN
INTRAVENOUS | Status: DC | PRN
  Administered 2016-08-24: 1000 mg via INTRAVENOUS

## 2016-08-24 MED ORDER — GLYCOPYRROLATE 0.2 MG/ML IJ SOLN
INTRAMUSCULAR | Status: AC
Start: 2016-08-24 — End: 2016-08-24
  Filled 2016-08-24: qty 1

## 2016-08-24 MED ORDER — ONDANSETRON HCL 4 MG/2ML IJ SOLN
INTRAMUSCULAR | Status: DC | PRN
  Administered 2016-08-24: 4 mg via INTRAVENOUS

## 2016-08-24 MED ORDER — BUPIVACAINE HCL (PF) 0.25 % IJ SOLN
INTRAMUSCULAR | Status: AC
  Filled 2016-08-24: qty 30

## 2016-08-24 MED ORDER — ONDANSETRON HCL 4 MG/2ML IJ SOLN
INTRAMUSCULAR | Status: AC
  Filled 2016-08-24: qty 2

## 2016-08-24 MED ORDER — FENTANYL CITRATE (PF) 100 MCG/2ML IJ SOLN
INTRAMUSCULAR | Status: DC | PRN
  Administered 2016-08-24: 50 ug via INTRAVENOUS

## 2016-08-24 SURGICAL SUPPLY — 65 items
ADAPTER IRRIG TUBE 2 SPIKE SOL (ADAPTER) ×6 IMPLANT
BUR RADIUS 4.0X18.5 (BURR) IMPLANT
BUR RADIUS 5.5 (BURR) ×3 IMPLANT
CANISTER SUCT LVC 12 LTR MEDI- (MISCELLANEOUS) ×3 IMPLANT
CANNULA 5.75X7 CRYSTAL CLEAR (CANNULA) ×3 IMPLANT
CANNULA PARTIAL THREAD 2X7 (CANNULA) ×3 IMPLANT
CANNULA TWIST IN 8.25X9CM (CANNULA) IMPLANT
CONNECTOR PERFECT PASSER (CONNECTOR) IMPLANT
COOLER POLAR GLACIER W/PUMP (MISCELLANEOUS) ×3 IMPLANT
CRADLE LAMINECT ARM (MISCELLANEOUS) ×6 IMPLANT
DEVICE SUCT BLK HOLE OR FLOOR (MISCELLANEOUS) ×6 IMPLANT
DRAPE IMP U-DRAPE 54X76 (DRAPES) ×6 IMPLANT
DRAPE INCISE IOBAN 66X45 STRL (DRAPES) ×3 IMPLANT
DRAPE SHEET LG 3/4 BI-LAMINATE (DRAPES) ×3 IMPLANT
DRAPE U-SHAPE 47X51 STRL (DRAPES) ×3 IMPLANT
DURAPREP 26ML APPLICATOR (WOUND CARE) ×9 IMPLANT
ELECT REM PT RETURN 9FT ADLT (ELECTROSURGICAL) ×3
ELECTRODE REM PT RTRN 9FT ADLT (ELECTROSURGICAL) ×2 IMPLANT
GAUZE PETRO XEROFOAM 1X8 (MISCELLANEOUS) ×3 IMPLANT
GAUZE SPONGE 4X4 12PLY STRL (GAUZE/BANDAGES/DRESSINGS) ×3 IMPLANT
GLOVE BIOGEL PI IND STRL 9 (GLOVE) ×4 IMPLANT
GLOVE BIOGEL PI INDICATOR 9 (GLOVE) ×2
GLOVE SURG 9.0 ORTHO LTXF (GLOVE) ×9 IMPLANT
GOWN STRL REUS TWL 2XL XL LVL4 (GOWN DISPOSABLE) ×6 IMPLANT
GOWN STRL REUS W/ TWL LRG LVL3 (GOWN DISPOSABLE) ×6 IMPLANT
GOWN STRL REUS W/TWL LRG LVL3 (GOWN DISPOSABLE) ×3
IV LACTATED RINGER IRRG 3000ML (IV SOLUTION) ×9
IV LR IRRIG 3000ML ARTHROMATIC (IV SOLUTION) ×18 IMPLANT
KIT RM TURNOVER STRD PROC AR (KITS) ×3 IMPLANT
KIT STABILIZATION SHOULDER (MISCELLANEOUS) ×3 IMPLANT
KIT SUTURE 2.8 Q-FIX DISP (MISCELLANEOUS) IMPLANT
KIT SUTURETAK 3.0 INSERT PERC (KITS) IMPLANT
MANIFOLD NEPTUNE II (INSTRUMENTS) ×3 IMPLANT
MASK FACE SPIDER DISP (MASK) ×3 IMPLANT
MAT BLUE FLOOR 46X72 FLO (MISCELLANEOUS) ×6 IMPLANT
NDL SAFETY 18GX1.5 (NEEDLE) IMPLANT
NDL SAFETY 22GX1.5 (NEEDLE) IMPLANT
NEEDLE HYPO 25X1 1.5 SAFETY (NEEDLE) ×3 IMPLANT
NS IRRIG 500ML POUR BTL (IV SOLUTION) ×3 IMPLANT
PACK ARTHROSCOPY SHOULDER (MISCELLANEOUS) ×3 IMPLANT
PAD WRAPON POLAR SHDR XLG (MISCELLANEOUS) ×2 IMPLANT
PASSER SUT CAPTURE FIRST (SUTURE) IMPLANT
SET TUBE SUCT SHAVER OUTFL 24K (TUBING) ×3 IMPLANT
SET TUBE TIP INTRA-ARTICULAR (MISCELLANEOUS) ×3 IMPLANT
STRIP CLOSURE SKIN 1/2X4 (GAUZE/BANDAGES/DRESSINGS) ×3 IMPLANT
SUT ETHILON 4-0 (SUTURE) ×1
SUT ETHILON 4-0 FS2 18XMFL BLK (SUTURE) ×2
SUT LASSO 90 DEG SD STR (SUTURE) IMPLANT
SUT MNCRL 4-0 (SUTURE)
SUT MNCRL 4-0 27XMFL (SUTURE)
SUT PDS AB 0 CT1 27 (SUTURE) IMPLANT
SUT PERFECTPASSER WHITE CART (SUTURE) IMPLANT
SUT SMART STITCH CARTRIDGE (SUTURE) IMPLANT
SUT VIC AB 0 CT1 36 (SUTURE) ×3 IMPLANT
SUT VIC AB 2-0 CT2 27 (SUTURE) IMPLANT
SUTURE ETHLN 4-0 FS2 18XMF BLK (SUTURE) ×2 IMPLANT
SUTURE MAGNUM WIRE 2X48 BLK (SUTURE) IMPLANT
SUTURE MNCRL 4-0 27XMF (SUTURE) IMPLANT
SYR 10ML LL (SYRINGE) ×3 IMPLANT
SYRINGE 10CC LL (SYRINGE) ×3 IMPLANT
TAPE MICROFOAM 4IN (TAPE) ×3 IMPLANT
TUBING ARTHRO INFLOW-ONLY STRL (TUBING) ×3 IMPLANT
TUBING CONNECTING 10 (TUBING) IMPLANT
WAND HAND CNTRL MULTIVAC 90 (MISCELLANEOUS) ×3 IMPLANT
WRAPON POLAR PAD SHDR XLG (MISCELLANEOUS) ×3

## 2016-08-24 NOTE — Transfer of Care (Signed)
Immediate Anesthesia Transfer of Care Note  Patient: Austin Stewart  Procedure(s) Performed: Procedure(s): ARTHROSCOPY SHOULDER with debridement (Left)  Patient Location: PACU  Anesthesia Type:General  Level of Consciousness: sedated  Airway & Oxygen Therapy: Patient Spontanous Breathing and Patient connected to face mask oxygen  Post-op Assessment: Report given to RN and Post -op Vital signs reviewed and stable  Post vital signs: Reviewed and stable  Last Vitals:  Vitals:   08/24/16 0718 08/24/16 1025  BP: 127/90 125/67  Pulse:  63  Resp: 14 19  Temp:  36.2 C    Last Pain:  Vitals:   08/24/16 1025  TempSrc: Temporal  PainSc:          Complications: No apparent anesthesia complications

## 2016-08-24 NOTE — Anesthesia Preprocedure Evaluation (Signed)
Anesthesia Evaluation  Patient identified by MRN, date of birth, ID band Patient awake    Reviewed: Allergy & Precautions, NPO status , Patient's Chart, lab work & pertinent test results  History of Anesthesia Complications Negative for: history of anesthetic complications  Airway Mallampati: II  TM Distance: >3 FB Neck ROM: Full    Dental no notable dental hx.    Pulmonary neg pulmonary ROS, neg sleep apnea, neg COPD,    breath sounds clear to auscultation- rhonchi (-) wheezing      Cardiovascular Exercise Tolerance: Good (-) hypertension+CHF (preserved EF)  (-) CAD and (-) Past MI + dysrhythmias (paroxysmal afib) Atrial Fibrillation + Valvular Problems/Murmurs (hx of MVR)  Rhythm:Regular Rate:Normal - Systolic murmurs and - Diastolic murmurs    Neuro/Psych negative neurological ROS  negative psych ROS   GI/Hepatic Neg liver ROS, GERD  ,  Endo/Other  negative endocrine ROSneg diabetes  Renal/GU negative Renal ROS     Musculoskeletal  (+) Arthritis ,   Abdominal (+) - obese,   Peds  Hematology negative hematology ROS (+)   Anesthesia Other Findings Past Medical History: No date: Allergic rhinitis, cause unspecified No date: Allergy No date: Cardiac dysrhythmia, unspecified No date: Diverticulosis of colon (without mention of he* No date: Dysrhythmia No date: Esophageal reflux No date: Measles without mention of complication No date: Mumps without mention of complication No date: Osteoarthrosis, unspecified whether generalize* No date: 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 No date: Unspecified hyperplasia of prostate without ur*   Reproductive/Obstetrics                             Anesthesia Physical Anesthesia Plan  ASA: II  Anesthesia Plan: General   Post-op Pain Management:  Regional for Post-op pain   Induction:  Intravenous  Airway Management Planned: Oral ETT  Additional Equipment:   Intra-op Plan:   Post-operative Plan: Extubation in OR  Informed Consent: I have reviewed the patients History and Physical, chart, labs and discussed the procedure including the risks, benefits and alternatives for the proposed anesthesia with the patient or authorized representative who has indicated his/her understanding and acceptance.   Dental advisory given  Plan Discussed with: CRNA and Anesthesiologist  Anesthesia Plan Comments:         Anesthesia Quick Evaluation

## 2016-08-24 NOTE — Anesthesia Post-op Follow-up Note (Cosign Needed)
Anesthesia QCDR form completed.        

## 2016-08-24 NOTE — Anesthesia Procedure Notes (Signed)
Anesthesia Regional Block: Interscalene brachial plexus block   Pre-Anesthetic Checklist: ,, timeout performed, Correct Patient, Correct Site, Correct Laterality, Correct Procedure, Correct Position, site marked, Risks and benefits discussed,  Surgical consent,  Pre-op evaluation,  At surgeon's request and post-op pain management  Laterality: Left  Prep: chloraprep       Needles:  Injection technique: Single-shot  Needle Type: Stimiplex     Needle Length: 9cm  Needle Gauge: 22     Additional Needles:   Procedures: ultrasound guided,,,,,,,,  Narrative:  Start time: 08/24/2016 7:10 AM End time: 08/24/2016 7:20 AM Injection made incrementally with aspirations every 5 mL.  Performed by: Personally  Anesthesiologist: Maude Hettich  Additional Notes: Functioning IV was confirmed and monitors were applied.  A 50mm 22ga Stimuplex needle was used. Sterile prep and drape,hand hygiene and sterile gloves were used.  Negative aspiration and negative test dose prior to incremental administration of local anesthetic. The patient tolerated the procedure well.

## 2016-08-24 NOTE — Anesthesia Postprocedure Evaluation (Signed)
Anesthesia Post Note  Patient: Austin Stewart  Procedure(s) Performed: Procedure(s) (LRB): ARTHROSCOPY SHOULDER with debridement (Left)  Patient location during evaluation: PACU Anesthesia Type: General Level of consciousness: awake and alert and oriented Pain management: pain level controlled Vital Signs Assessment: post-procedure vital signs reviewed and stable Respiratory status: spontaneous breathing, nonlabored ventilation and respiratory function stable Cardiovascular status: blood pressure returned to baseline and stable Postop Assessment: no signs of nausea or vomiting Anesthetic complications: no     Last Vitals:  Vitals:   08/24/16 1110 08/24/16 1120  BP: 127/85   Pulse: 64 75  Resp: 19 20  Temp:      Last Pain:  Vitals:   08/24/16 1025  TempSrc: Temporal  PainSc:                  [name]

## 2016-08-24 NOTE — H&P (Signed)
The patient has been re-examined, and the chart reviewed, and there have been no interval changes to the documented history and physical.    The risks, benefits, and alternatives have been discussed at length, and the patient is willing to proceed.   

## 2016-08-24 NOTE — Op Note (Signed)
08/24/2016  10:49 AM  PATIENT:  Austin Stewart  61 y.o. 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 male with a diagnosis of M75.122 Complete rotatr-cuff tear/ruptr of left shoulder, not trauma failed conservative treatment and elected for surgical management.    The risks benefits and alternatives were discussed with the patient preoperatively including but not limited to the risks of infection, bleeding, nerve injury, persistent pain or weakness, failure of the hardware, re-tear of the rotator cuff and the need for further surgery. Medical risks include DVT and pulmonary embolism, myocardial infarction, stroke, pneumonia, respiratory failure and death. Patient understood these risks and wished to proceed.  OPERATIVE FINDINGS: Patient was found to have a full-thickness tear involving the supraspinatus. Patient appeared to have failed a previous side to side repair. Patient had diffuse fraying of the chondral surface of the humeral head without full-thickness defect.  The intra-articular portion of the tendon of the long head of the biceps was not visualized and had either been previously released or spontaneously ruptured.  Patient did not have a labral tear.  OPERATIVE PROCEDURE: The patient was met in the preoperative area. The left shoulder was signed with the word yes and my initials according the hospital's correct site of surgery protocol.   History and physical was updated.   Patient underwent an interscalene block by the  anesthesia service.  Patient was brought to the operating room where he underwent general endotracheal intubation.  The patient was placed in a beachchair position.  A spider arm positioner was used for this case. Examination under anesthesia revealed no limitation of motion or instability with load shift testing. The patient had a negative sulcus sign.  Patient was prepped and draped in a sterile fashion. A timeout was performed to verify the patient's name, date of birth, medical record number, correct site of surgery and correct procedure to be performed there was also used to verify the patient received antibiotics that all appropriate instruments, implants and radiographs studies were available in the room. Once all in attendance were in agreement case began.  Bony landmarks were drawn out with a surgical marker along with proposed arthroscopy incisions. These were pre-injected with 1% lidocaine plain. An 11 blade was used to establish a posterior portal through which the arthroscope was placed in the glenohumeral joint. A full diagnostic examination of the shoulder was performed.  The findings are listed above.  Extensive bursitis and adhessions were encountered and debrided using a 4-0 resector shaver blade and a 90 ArthroCare wand from a lateral portal which was established under direct visualization using an 18-gauge spinal needle. A subacromial debridement was  performed using a 4.0 mm resector shaver blade and 90 degree Arthrocare wand from the lateral portal.   A Theador Hawthorne grasper was then used to assess the rotator cuff following extensive lysis of adhesions and debridement. The previous sutures were removed from the edges of the rotator cuff. Some sutures were in the anterior portion of the supraspinatus and some wear in the posterior aspect extending from lateral to medial.  The tear was deemed to be a large crescentic tear extending at its apex to the glenoid. Despite significant lysis of  adhesions and soft tissue debridement the rotator cuff was not very mobile. I do not feel that another attempt at a side-to-side repair would be successful. There was significant fraying and friability to the edges of the rotator cuff.  I broke scrub to talk with the patient's wife to explain the situation. I explained that I could try a side-to-side repair but was not confident that this would heal. The other alternative would be for the patient to explore options such as a superior capsular reconstruction versus total shoulder arthroplasty. The patient's wife indicated that he would most likely want to explore other options given the history of a failed previous rotator cuff repair.  The decision was therefore made to not repair the rotator cuff.   I returned to the OR. After scrubbing back in I copiously irrigated the shoulder joint arthroscopically. All arthroscopic instruments were then removed. All incisions were copiously irrigated. Skin closure for the arthroscopic incisions was performed with 4-0 nylon. 0.25% Marcaine plain was then injected into the subacromial space for postoperative pain control. A dry sterile dressing was applied.  The patient was placed in a regular sling and a Polar Care was applied to the left shoulder.  All sharp and it instrument counts were correct at the conclusion of the case. I was scrubbed and present for the entire case. I spoke with the patient's wife postoperatively to let her know the case had been performed without complication and the patient was stable in recovery room.

## 2016-08-24 NOTE — Anesthesia Procedure Notes (Signed)
Procedure Name: Intubation Date/Time: 08/24/2016 7:46 AM Performed by: Ginger Carne Pre-anesthesia Checklist: Patient identified, Emergency Drugs available, Suction available, Patient being monitored and Timeout performed Patient Re-evaluated:Patient Re-evaluated prior to inductionOxygen Delivery Method: Circle system utilized Preoxygenation: Pre-oxygenation with 100% oxygen Intubation Type: IV induction Ventilation: Mask ventilation without difficulty Laryngoscope Size: Miller and 2 Grade View: Grade I Tube type: Oral Tube size: 7.5 mm Number of attempts: 1 Airway Equipment and Method: Stylet Placement Confirmation: ETT inserted through vocal cords under direct vision and positive ETCO2 Secured at: 22 cm Tube secured with: Tape Dental Injury: Teeth and Oropharynx as per pre-operative assessment

## 2016-08-24 NOTE — Discharge Instructions (Signed)

## 2016-09-01 ENCOUNTER — Ambulatory Visit: Payer: 59 | Attending: Orthopedic Surgery | Admitting: Physical Therapy

## 2016-09-01 DIAGNOSIS — M25512 Pain in left shoulder: Secondary | ICD-10-CM | POA: Diagnosis not present

## 2016-09-01 DIAGNOSIS — M6281 Muscle weakness (generalized): Secondary | ICD-10-CM | POA: Diagnosis not present

## 2016-09-01 DIAGNOSIS — G8929 Other chronic pain: Secondary | ICD-10-CM | POA: Diagnosis not present

## 2016-09-01 NOTE — Patient Instructions (Signed)
Cervical rotation L/R (mild pain to L) R ROM restricted ~ 5-10 degrees from L  Repeated chin tucks x 5 (no increase in pain)  Spurling's - negative   Flexion/extension - no pain   L flexion to 142 (afraid to bring eccentrically) R flexion to 158   R abduction - 163 L abduction to 145 (pain on eccentric)   L ER - 63 IR - 48   R side  ER/IR - WNL   Supine AAROM on L side to 145 degrees (no pain)   IR/ER MMT - 5/5 and no pain Abduction - 4+/5 - painful  Extension - 5/5

## 2016-09-06 ENCOUNTER — Ambulatory Visit: Payer: 59 | Admitting: Physical Therapy

## 2016-09-06 DIAGNOSIS — G8929 Other chronic pain: Secondary | ICD-10-CM | POA: Diagnosis not present

## 2016-09-06 DIAGNOSIS — M6281 Muscle weakness (generalized): Secondary | ICD-10-CM | POA: Diagnosis not present

## 2016-09-06 DIAGNOSIS — M25512 Pain in left shoulder: Secondary | ICD-10-CM | POA: Diagnosis not present

## 2016-09-06 NOTE — Therapy (Signed)
[facility]ICINE [address], [place], [address] Phone: [phone]   Fax:  [phone]  Physical Therapy Evaluation  Patient Details  Name: Austin Stewart MRN: [id number] Date of Birth: [birth year] No Data Recorded  Encounter Date: 09/01/2016      PT End of Session - 09/06/16 1223    Visit Number 1   Number of Visits 13   Date for PT Re-Evaluation 10/30/16   PT Start Time 1257   PT Stop Time 1357   PT Time Calculation (min) 60 min   Activity Tolerance Patient tolerated treatment well   Behavior During Therapy [facility] for tasks assessed/performed      Past Medical History:  Diagnosis Date  . Allergic rhinitis, cause unspecified   . Allergy   . Cardiac dysrhythmia, unspecified   . Diverticulosis of colon (without mention of hemorrhage)   . Dysrhythmia   . Esophageal reflux   . Measles without mention of complication   . Mumps without mention of complication   . Osteoarthrosis, unspecified whether generalized or localized, unspecified site   . 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 Unspecified hyperplasia of prostate without urinary obstruction and other lower urinary tract symptoms (LUTS)     Past Surgical History:  Procedure Laterality Date  . CARDIAC CATHETERIZATION  2006   [facility]   . HERNIA REPAIR    . MITRAL VALVE REPAIR    . rotator cuff repair    . SHOULDER ARTHROSCOPY Left 08/24/2016   Procedure: ARTHROSCOPY SHOULDER with debridement;  Surgeon: Juanell Fairly, MD;  Location: [facility] ORS;  Service: Orthopedics;  Laterality: Left;  . TONSILLECTOMY      There were no vitals filed for this visit.       Subjective Assessment - 09/06/16 1157    Subjective Patient reports he had a L rotator cuff repair in 2013 after lifting a heavy box above his waist. He was able to do rehab and was doing fairly well, but re-injured his arm this spring. Since that time he  has had constant ache in antero-superior shoulder. He underwent procedure last week to perform second rotator cuff repair, however his surgeon felt that given the damage involved, that a second repair was not appropriate and performed debridement instead. His possible options now include total shoulder, therapy, and/or a muscular flap surgery.    Limitations Lifting;House hold activities   Diagnostic tests Intra-operative findings indicative of split cuff tendon and full tear.    Patient Stated Goals To be able to avoid another surgery and return to full work duties.    Currently in Pain? Yes   Pain Score 2    Pain Location Shoulder   Pain Orientation Left   Pain Descriptors / Indicators Aching   Pain Type Chronic pain;Surgical pain   Pain Onset More than a month ago   Pain Frequency Constant   Aggravating Factors  Lifting his arm overhead    Pain Relieving Factors Rest, ice, arm at side      QuickDash -41%  Cervical rotation L/R (mild pain to L) R ROM restricted ~ 5-10 degrees from L  Repeated chin tucks x 5 (no increase in pain)  Spurling's - negative   Flexion/extension - no pain   L flexion to 142 (afraid to bring eccentrically) R flexion to 158   R abduction - 163 L abduction to 145 (pain on eccentric)   L ER -  63 IR - 48   R side  ER/IR - WNL   Supine AAROM on L side to 145 degrees (no pain)   IR/ER MMT - 5/5 and no pain Abduction - 4+/5 - painful  Extension - 5/5   There_Ex  Crossbody stretch (to increase IR ROM) educated patient on appropriate technique, no increased pain reported  Educated patient on passive ER ROM (PT performed first x 5 minutes, then allowed patient to complete with PVC pipe in supine)  AAROM through full flexion ROM in supine x 10 for 2 sets to educate on achieving full fleixon ROM.  Bent over Y's/T's to strengthen scapulo-thoracic musculature x 10 per condition (challenging, but well tolerated)                           PT Education - 09/06/16 1208    Education provided Yes   Education Details Exam findings, HEP, plan of care.    Person(s) Educated Patient   Methods Explanation;Demonstration;Handout   Comprehension Returned demonstration;Verbalized understanding             PT Long Term Goals - 09/06/16 1219      PT LONG TERM GOAL #1   Title Patient will demonstrate at least 165 degrees of active shoulder flexion to perform overhead activities.    Baseline 142 degrees    Time 8   Period Weeks   Status New     PT LONG TERM GOAL #2   Title Patient will lift at least 10# overhead with no increase in pain to demonstrate increased functional use of LUE.    Baseline Unable to lift weight due to pain.    Time 8   Period Weeks   Status New     PT LONG TERM GOAL #3   Title Patient will report worst pain of 2/10 in LUE to demonstrate improved tolerance for ADLs.    Baseline 6/10   Time 8   Period Weeks   Status New     PT LONG TERM GOAL #4   Title Patient will report Quick Dash score of less than 25% to demonstrate improved tolerance for ADLs.    Baseline 41%   Time 8   Period Weeks   Status New               Plan - 09/06/16 1219    Clinical Impression Statement Patient presents s/p debridement after surgeon opted to unscrub from sx due to poor candidacy for secondary rotator cuff repair. He reports his pain is largely decreased, but is having trouble with ROM. His passive ER and active flexion/abduction are moderately limited as is his scapulothoracic and glenohumeral muscular strength/endurance. His options now include therapy or more invasive surgery like a shoulder replacement. He would benefit from skilled PT services to address pain, strength, and ROM deficits.    Rehab Potential Fair   Clinical Impairments Affecting Rehab Potential Surgeon felt 2nd repair was not appropriate in sx.    PT Frequency 2x / week   PT Duration 6 weeks   PT Treatment/Interventions Aquatic  Therapy;Moist Heat;Iontophoresis 4mg /ml Dexamethasone;Cryotherapy;Electrical Stimulation;Therapeutic exercise;Therapeutic activities;Neuromuscular re-education;Taping;Manual techniques;Dry needling   PT Next Visit Plan Progress ROM and tissue loading as indicated by progress.    PT Home Exercise Plan Passive ER stretching, cross body stretch, AAROM with dowel, Y's/T's.    Consulted and Agree with Plan of Care Patient      Patient will benefit from skilled therapeutic  intervention in order to improve the following deficits and impairments:  Pain, Improper body mechanics, Decreased range of motion, Decreased strength, Impaired UE functional use  Visit Diagnosis: Chronic left shoulder pain - Plan: PT plan of care cert/re-cert  Muscle weakness (generalized) - Plan: PT plan of care cert/re-cert     Problem List Patient Active Problem List   Diagnosis Date Noted  . Trochanteric bursitis, left hip 02/01/2016  . Pre-diabetes 08/05/2015  . Elevated blood sugar 08/05/2015  . Seasonal allergies 08/05/2015  . Elevated lipids 08/05/2015  . Chest tightness 06/11/2015  . Cutaneous skin tags 01/05/2015  . GERD without esophagitis 12/07/2014  . BPH (benign prostatic hyperplasia) 12/07/2014  . Diastolic CHF (HCC) 06/09/2014  . Shortness of breath 06/09/2013  . H/O mitral valve repair 06/03/2012  . Heart palpitations 06/03/2012  . Dizziness 06/03/2012    Alva Garnet PT, DPT, CSCS    09/06/2016, 12:25 PM  [facility] PH[facility] SPORTS MEDICINE [address] [place], [address] Phone: [phone]   Fax:  [phone]  Name: Austin Stewart MRN: [id number] Date of Birth: 04-16-1956

## 2016-09-07 NOTE — Therapy (Signed)
[facility] [address] [place], [address] Phone: [phone]   Fax:  [phone]  Physical Therapy Treatment  Patient Details  Name: Austin Stewart MRN: [id number] Date of Birth: 10-29-1955 No Data Recorded  Encounter Date: 09/06/2016      PT End of Session - 09/07/16 0909    Visit Number 2   Number of Visits 13   Date for PT Re-Evaluation 10/30/16   PT Start Time 1115   PT Stop Time 1150   PT Time Calculation (min) 35 min   Activity Tolerance Patient tolerated treatment well;Patient limited by fatigue   Behavior During Therapy [facility] for tasks assessed/performed      Past Medical History:  Diagnosis Date  . Allergic rhinitis, cause unspecified   . Allergy   . Cardiac dysrhythmia, unspecified   . Diverticulosis of colon (without mention of hemorrhage)   . Dysrhythmia   . Esophageal reflux   . Measles without mention of complication   . Mumps without mention of complication   . Osteoarthrosis, unspecified whether generalized or localized, unspecified site   . 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 Unspecified hyperplasia of prostate without urinary obstruction and other lower urinary tract symptoms (LUTS)     Past Surgical History:  Procedure Laterality Date  . CARDIAC CATHETERIZATION  2006   [facility]   . HERNIA REPAIR    . MITRAL VALVE REPAIR    . rotator cuff repair    . SHOULDER ARTHROSCOPY Left 08/24/2016   Procedure: ARTHROSCOPY SHOULDER with debridement;  Surgeon: Juanell Fairly, MD;  Location: [facility] ORS;  Service: Orthopedics;  Laterality: Left;  . TONSILLECTOMY      There were no vitals filed for this visit.      Subjective Assessment - 09/07/16 0908    Subjective Patient reports his pain level was a constant aching, but that has subsided since sx and PT. Has been using muscle relaxers recently. Was using a sling at work the past few days.  Prior to sx he was having pain and unable to take his L arm and bring it to counter clockwise on driving wheel, which he can now.    Limitations Lifting;House hold activities   Diagnostic tests Intra-operative findings indicative of split cuff tendon and full tear.    Patient Stated Goals To be able to avoid another surgery and return to full work duties.    Currently in Pain? Other (Comment)  Reports minor ache with overhead movements, but much reduced from before surgery      PROM ER to 80 degrees  L Flexion to 162 degrees (no pain reported)  L abduction to 145 degrees (mild to moderate pain) Standing one arm cable rows 10# x 15 (easy) 20# x 12 (cuing for limiting humeral extension) Wall slides with yellow t-band x 12 for 2 sets  Standing ER cable with towel roll x 5# x 15 repetitions for 2 sets (appropriate mechanics)  TM push ups x 10 for 2 sets (second set on blue foam pad)  D1 flexion with yellow t-band x 6, x 6 (cuing for appropriate technique, not subbing in UT)                            PT Education - 09/07/16 0909    Education provided Yes   Education Details Excellent progress with ROM to date,  improving ER correlates well with reduced pain levels.    Person(s) Educated Patient   Methods Explanation;Demonstration   Comprehension Verbalized understanding;Returned demonstration             PT Long Term Goals - 09/06/16 1219      PT LONG TERM GOAL #1   Title Patient will demonstrate at least 165 degrees of active shoulder flexion to perform overhead activities.    Baseline 142 degrees    Time 8   Period Weeks   Status New     PT LONG TERM GOAL #2   Title Patient will lift at least 10# overhead with no increase in pain to demonstrate increased functional use of LUE.    Baseline Unable to lift weight due to pain.    Time 8   Period Weeks   Status New     PT LONG TERM GOAL #3   Title Patient will report worst pain of 2/10 in LUE to  demonstrate improved tolerance for ADLs.    Baseline 6/10   Time 8   Period Weeks   Status New     PT LONG TERM GOAL #4   Title Patient will report Quick Dash score of less than 25% to demonstrate improved tolerance for ADLs.    Baseline 41%   Time 8   Period Weeks   Status New               Plan - 09/07/16 0910    Clinical Impression Statement Patient demonstrates excellent improvement in active flexion and passive external ranges of motion, along with reports of decreased pain. Patient is now more appropriate for strengthening, and tolerates resisted movements well, will eventually need transition to plyometric style activities when indicated.    Rehab Potential Fair   Clinical Impairments Affecting Rehab Potential Surgeon felt 2nd repair was not appropriate in sx.    PT Frequency 2x / week   PT Duration 6 weeks   PT Treatment/Interventions Aquatic Therapy;Moist Heat;Iontophoresis 4mg /ml Dexamethasone;Cryotherapy;Electrical Stimulation;Therapeutic exercise;Therapeutic activities;Neuromuscular re-education;Taping;Manual techniques;Dry needling   PT Next Visit Plan Progress ROM and tissue loading as indicated by progress.    PT Home Exercise Plan Passive ER stretching, cross body stretch, AAROM with dowel, Y's/T's.    Consulted and Agree with Plan of Care Patient      Patient will benefit from skilled therapeutic intervention in order to improve the following deficits and impairments:  Pain, Improper body mechanics, Decreased range of motion, Decreased strength, Impaired UE functional use  Visit Diagnosis: Chronic left shoulder pain  Muscle weakness (generalized)     Problem List Patient Active Problem List   Diagnosis Date Noted  . Trochanteric bursitis, left hip 02/01/2016  . Pre-diabetes 08/05/2015  . Elevated blood sugar 08/05/2015  . Seasonal allergies 08/05/2015  . Elevated lipids 08/05/2015  . Chest tightness 06/11/2015  . Cutaneous skin tags 01/05/2015   . GERD without esophagitis 12/07/2014  . BPH (benign prostatic hyperplasia) 12/07/2014  . Diastolic CHF (HCC) 06/09/2014  . Shortness of breath 06/09/2013  . H/O mitral valve repair 06/03/2012  . Heart palpitations 06/03/2012  . Dizziness 06/03/2012   Alva GarnetPatrick Josh Nicolosi PT, DPT, CSCS    09/07/2016, 10:49 AM  [facility] [address] [place], [place], [address] Phone: [phone]   Fax:  [phone]  Name: Lollie MarrowRobert D Siebels MRN: [id number] Date of Birth: 06/13/1955

## 2016-09-08 ENCOUNTER — Ambulatory Visit: Payer: 59 | Admitting: Physical Therapy

## 2016-09-08 DIAGNOSIS — M25512 Pain in left shoulder: Secondary | ICD-10-CM | POA: Diagnosis not present

## 2016-09-08 DIAGNOSIS — G8929 Other chronic pain: Secondary | ICD-10-CM | POA: Diagnosis not present

## 2016-09-08 DIAGNOSIS — M6281 Muscle weakness (generalized): Secondary | ICD-10-CM | POA: Diagnosis not present

## 2016-09-08 NOTE — Patient Instructions (Addendum)
AROM to 158 on RUE in flexion AROM to 160 on LUE in flexion (no pain on eccentric)   Soft tissue mobilization to infraspinatus tendon area   Scaption with 1# DB x 11 (began to fatigue, controlled speed on eccentric to reduce clicking feeling -- 2 sets -- no popping sensation with more controlled speed   Bent over Y with 1# DB x 8 before fatigue (very difficult for the last few degrees) x 10 on second set   Bent over T with 2# DB x 10 (to fatigue)   Farmer's walk with 5# weight - 100' (easy) -- 10# weight - 100' (began to fatigue)   Single arm pulldowns with 5# x 12, x 12 with 10# -- still fairly light for him   Seated rows - 25# x12 repetitions, 35# x10  90-90 ER x 10 for 2 sets

## 2016-09-13 ENCOUNTER — Ambulatory Visit: Payer: 59 | Admitting: Physical Therapy

## 2016-09-13 ENCOUNTER — Encounter: Payer: Self-pay | Admitting: Physical Therapy

## 2016-09-13 DIAGNOSIS — M6281 Muscle weakness (generalized): Secondary | ICD-10-CM

## 2016-09-13 DIAGNOSIS — G8929 Other chronic pain: Secondary | ICD-10-CM

## 2016-09-13 DIAGNOSIS — M25512 Pain in left shoulder: Secondary | ICD-10-CM | POA: Diagnosis not present

## 2016-09-13 NOTE — Therapy (Signed)
[facility] [address] [place], [address] Phone: [phone]   Fax:  [phone]  Physical Therapy Treatment  Patient Details  Name: Austin Stewart MRN: [id number] Date of Birth: 08-08-1955 No Data Recorded  Encounter Date: 09/13/2016      PT End of Session - 09/13/16 1045    Visit Number 4   Number of Visits 13   Date for PT Re-Evaluation 10/30/16   PT Start Time 1032   PT Stop Time 1110   PT Time Calculation (min) 38 min   Activity Tolerance Patient tolerated treatment well   Behavior During Therapy [facility] for tasks assessed/performed      Past Medical History:  Diagnosis Date  . Allergic rhinitis, cause unspecified   . Allergy   . Cardiac dysrhythmia, unspecified   . Diverticulosis of colon (without mention of hemorrhage)   . Dysrhythmia   . Esophageal reflux   . Measles without mention of complication   . Mumps without mention of complication   . Osteoarthrosis, unspecified whether generalized or localized, unspecified site   . 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 Unspecified hyperplasia of prostate without urinary obstruction and other lower urinary tract symptoms (LUTS)     Past Surgical History:  Procedure Laterality Date  . CARDIAC CATHETERIZATION  2006   [facility]   . HERNIA REPAIR    . MITRAL VALVE REPAIR    . rotator cuff repair    . SHOULDER ARTHROSCOPY Left 08/24/2016   Procedure: ARTHROSCOPY SHOULDER with debridement;  Surgeon: Juanell Fairly, MD;  Location: [facility] ORS;  Service: Orthopedics;  Laterality: Left;  . TONSILLECTOMY      There were no vitals filed for this visit.      Subjective Assessment - 09/13/16 1036    Subjective Pt reports he is doing well this date, no new changes or concerns.     Limitations Lifting;House hold activities   Diagnostic tests Intra-operative findings indicative of split cuff tendon and full tear.     Patient Stated Goals To be able to avoid another surgery and return to full work duties.    Currently in Pain? Yes   Pain Score 2    Pain Location Shoulder   Pain Orientation Left   Pain Descriptors / Indicators Aching   Pain Type Chronic pain   Pain Onset More than a month ago       AROM to 157 on LUE in flexion (no pain on eccentric)   Manual Therapy:  Soft tissue mobilization to infraspinatus with palpable increased muscular tension, pt reports relief following      Therapeutic Exercise:  Scaption with 1# DB 2x15, pt denies clicking or popping.   Bent over Y with 1# DB x10 and 2# DB x8 with fatigue noted toward end of second set   Bent over T with 2# DB x 10 with cues for scapular squeeze at end range (to fatigue)   Farmer's walk with 10# weight - 2x100' with cues for scapular retraction and relaxing shoulders   Single arm pulldowns with 5# x 10, 10# x10, 15# x 10 (mildly challenging for pt)   Seated rows - 25# x10 repetitions, 35# x10, 45# x7 with fatigue at end of this set. Cues for scapular squeeze.   90-90 ER x 10 without weight and x8 with 1# weight with fatigue and difficulty holding LUE into abd  PT Education - 09/13/16 1045    Education provided Yes   Education Details Exercise technique; posture   Person(s) Educated Patient   Methods Explanation;Demonstration;Verbal cues   Comprehension Verbalized understanding;Returned demonstration;Verbal cues required;Need further instruction             PT Long Term Goals - 09/06/16 1219      PT LONG TERM GOAL #1   Title Patient will demonstrate at least 165 degrees of active shoulder flexion to perform overhead activities.    Baseline 142 degrees    Time 8   Period Weeks   Status New     PT LONG TERM GOAL #2   Title Patient will lift at least 10# overhead with no increase in pain to demonstrate increased functional use of LUE.    Baseline Unable to lift weight due to pain.    Time 8   Period  Weeks   Status New     PT LONG TERM GOAL #3   Title Patient will report worst pain of 2/10 in LUE to demonstrate improved tolerance for ADLs.    Baseline 6/10   Time 8   Period Weeks   Status New     PT LONG TERM GOAL #4   Title Patient will report Quick Dash score of less than 25% to demonstrate improved tolerance for ADLs.    Baseline 41%   Time 8   Period Weeks   Status New               Plan - 09/13/16 1048    Clinical Impression Statement Pt responded well to STM to L infraspinatus musculature and denies increased pain throughout seession.  Pt educated on posture when performing strengthening exercises which he was able to properly demonstrate back to this therapist.  Pt performed most exercises to fatigue.  He will benefit from continued skilled PT interventions for improved posture, strength, endurance, and QOL.   Rehab Potential Fair   Clinical Impairments Affecting Rehab Potential Surgeon felt 2nd repair was not appropriate in sx.    PT Frequency 2x / week   PT Duration 6 weeks   PT Treatment/Interventions Aquatic Therapy;Moist Heat;Iontophoresis 4mg /ml Dexamethasone;Cryotherapy;Electrical Stimulation;Therapeutic exercise;Therapeutic activities;Neuromuscular re-education;Taping;Manual techniques;Dry needling   PT Next Visit Plan Progress ROM and tissue loading as indicated by progress.    PT Home Exercise Plan Passive ER stretching, cross body stretch, AAROM with dowel, Y's/T's.    Consulted and Agree with Plan of Care Patient      Patient will benefit from skilled therapeutic intervention in order to improve the following deficits and impairments:  Pain, Improper body mechanics, Decreased range of motion, Decreased strength, Impaired UE functional use  Visit Diagnosis: Chronic left shoulder pain  Muscle weakness (generalized)     Problem List Patient Active Problem List   Diagnosis Date Noted  . Trochanteric bursitis, left hip 02/01/2016  . Pre-diabetes  08/05/2015  . Elevated blood sugar 08/05/2015  . Seasonal allergies 08/05/2015  . Elevated lipids 08/05/2015  . Chest tightness 06/11/2015  . Cutaneous skin tags 01/05/2015  . GERD without esophagitis 12/07/2014  . BPH (benign prostatic hyperplasia) 12/07/2014  . Diastolic CHF (HCC) 06/09/2014  . Shortness of breath 06/09/2013  . H/O mitral valve repair 06/03/2012  . Heart palpitations 06/03/2012  . Dizziness 06/03/2012    Encarnacion ChuAshley Abashian PT, DPT 09/13/2016, 11:09 AM  [facility] [address] [place], [place], [address] Phone: [phone]   Fax:  [phone]  Name: Austin Stewart MRN: [id number] Date of Birth: [birth year]

## 2016-09-13 NOTE — Therapy (Signed)
[facility] [address] [place], [place], [address] Phone: [phone]   Fax:  [phone]  Physical Therapy Treatment  Patient Details  Name: Austin Stewart MRN: [id number] Date of Birth: 04/04/1956 No Data Recorded  Encounter Date: 09/08/2016      PT End of Session - 09/13/16 0754    Visit Number 3   Number of Visits 13   Date for PT Re-Evaluation 10/30/16   PT Start Time 1152   PT Stop Time 1230   PT Time Calculation (min) 38 min   Activity Tolerance Patient tolerated treatment well   Behavior [facility] for tasks assessed/performed      Past Medical History:  Diagnosis Date  . Allergic rhinitis, cause unspecified   . Allergy   . Cardiac dysrhythmia, unspecified   . Diverticulosis of colon (without mention of hemorrhage)   . Dysrhythmia   . Esophageal reflux   . Measles without mention of complication   . Mumps without mention of complication   . Osteoarthrosis, unspecified whether generalized or localized, unspecified site   . 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 Unspecified hyperplasia of prostate without urinary obstruction and other lower urinary tract symptoms (LUTS)     Past Surgical History:  Procedure Laterality Date  . CARDIAC CATHETERIZATION  2006   [facility]   . HERNIA REPAIR    . MITRAL VALVE REPAIR    . rotator cuff repair    . SHOULDER ARTHROSCOPY Left 08/24/2016   Procedure: ARTHROSCOPY SHOULDER with debridement;  Surgeon: Juanell FairlyKevin Krasinski, MD;  Location: [facility] ORS;  Service: Orthopedics;  Laterality: Left;  . TONSILLECTOMY      There were no vitals filed for this visit.      Subjective Assessment - 09/13/16 0753    Subjective Patient reports he has had minimal pain since last PT session, some achiness but largely subsided with stretching/exercises provided.    Limitations Lifting;House hold activities   Diagnostic tests  Intra-operative findings indicative of split cuff tendon and full tear.    Patient Stated Goals To be able to avoid another surgery and return to full work duties.    Currently in Pain? Other (Comment)  Very mild soreness/achiness at times with movement        AROM to 158 on RUE in flexion AROM to 160 on LUE in flexion (no pain on eccentric)   Soft tissue mobilization to infraspinatus tendon area   Scaption with 1# DB x 11 (began to fatigue, controlled speed on eccentric to reduce clicking feeling -- 2 sets -- no popping sensation with more controlled speed   Bent over Y with 1# DB x 8 before fatigue (very difficult for the last few degrees) x 10 on second set   Bent over T with 2# DB x 10 (to fatigue)   Farmer's walk with 5# weight - 100' (easy) -- 10# weight - 100' (began to fatigue)   Single arm pulldowns with 5# x 12, x 12 with 10# -- still fairly light for him   Seated rows - 25# x12 repetitions, 35# x10  90-90 ER x 10 for 2 sets with yellow t-band                           PT Education - 09/13/16 0754    Education provided Yes   Education Details Excellent progress with strengthening  progression. Will continue to build on strength as long as pain and ROM are WNL>   Person(s) Educated Patient   Methods Explanation;Demonstration   Comprehension Verbalized understanding;Returned demonstration             PT Long Term Goals - 09/06/16 1219      PT LONG TERM GOAL #1   Title Patient will demonstrate at least 165 degrees of active shoulder flexion to perform overhead activities.    Baseline 142 degrees    Time 8   Period Weeks   Status New     PT LONG TERM GOAL #2   Title Patient will lift at least 10# overhead with no increase in pain to demonstrate increased functional use of LUE.    Baseline Unable to lift weight due to pain.    Time 8   Period Weeks   Status New     PT LONG TERM GOAL #3   Title Patient will report worst pain of 2/10  in LUE to demonstrate improved tolerance for ADLs.    Baseline 6/10   Time 8   Period Weeks   Status New     PT LONG TERM GOAL #4   Title Patient will report Quick Dash score of less than 25% to demonstrate improved tolerance for ADLs.    Baseline 41%   Time 8   Period Weeks   Status New               Plan - 09/13/16 0754    Clinical Impression Statement Patient continues to have excellent ROM with minimal to no pain, just soreness. He is tolerating progression of resistance exercises with no increase in symptoms. He appears to be making good progress towards functional goals of riding a bike and lifting weighted objects above belt height again.    Rehab Potential Fair   Clinical Impairments Affecting Rehab Potential Surgeon felt 2nd repair was not appropriate in sx.    PT Frequency 2x / week   PT Duration 6 weeks   PT Treatment/Interventions Aquatic Therapy;Moist Heat;Iontophoresis 4mg /ml Dexamethasone;Cryotherapy;Electrical Stimulation;Therapeutic exercise;Therapeutic activities;Neuromuscular re-education;Taping;Manual techniques;Dry needling   PT Next Visit Plan Progress ROM and tissue loading as indicated by progress.    PT Home Exercise Plan Passive ER stretching, cross body stretch, AAROM with dowel, Y's/T's.    Consulted and Agree with Plan of Care Patient      Patient will benefit from skilled therapeutic intervention in order to improve the following deficits and impairments:  Pain, Improper body mechanics, Decreased range of motion, Decreased strength, Impaired UE functional use  Visit Diagnosis: Chronic left shoulder pain  Muscle weakness (generalized)     Problem List Patient Active Problem List   Diagnosis Date Noted  . Trochanteric bursitis, left hip 02/01/2016  . Pre-diabetes 08/05/2015  . Elevated blood sugar 08/05/2015  . Seasonal allergies 08/05/2015  . Elevated lipids 08/05/2015  . Chest tightness 06/11/2015  . Cutaneous skin tags 01/05/2015   . GERD without esophagitis 12/07/2014  . BPH (benign prostatic hyperplasia) 12/07/2014  . Diastolic CHF (HCC) 06/09/2014  . Shortness of breath 06/09/2013  . H/O mitral valve repair 06/03/2012  . Heart palpitations 06/03/2012  . Dizziness 06/03/2012    Alva Garnet PT, DPT, CSCS    09/13/2016, 7:55 AM  [place] PHYS[facility] AND SPORTS MEDICINE [address], [place], [address] Phone: [phone]   Fax:  [phone]  Name: Austin Stewart MRN: [id number] Date of Birth: 1956-02-16

## 2016-09-15 ENCOUNTER — Ambulatory Visit: Payer: 59 | Admitting: Physical Therapy

## 2016-09-15 DIAGNOSIS — M25512 Pain in left shoulder: Principal | ICD-10-CM

## 2016-09-15 DIAGNOSIS — M6281 Muscle weakness (generalized): Secondary | ICD-10-CM

## 2016-09-15 DIAGNOSIS — G8929 Other chronic pain: Secondary | ICD-10-CM | POA: Diagnosis not present

## 2016-09-15 NOTE — Therapy (Signed)
[facility] PHYSICAL AND SPORTS MEDICINE [address] [place], [place], [address] Phone: [phone]   Fax:  [phone]  Physical Therapy Treatment  Patient Details  Name: Austin MarrowRobert D Stewart MRN: [id number] Date of Birth: 10/11/1955 No Data Recorded  Encounter Date: 09/15/2016      PT End of Session - 09/15/16 1151    Visit Number 5   Number of Visits 13   Date for PT Re-Evaluation 10/30/16   PT Start Time 1135   PT Stop Time 1213   PT Time Calculation (min) 38 min   Activity Tolerance Patient tolerated treatment well   Behavior During Therapy [facility] for tasks assessed/performed      Past Medical History:  Diagnosis Date  . Allergic rhinitis, cause unspecified   . Allergy   . Cardiac dysrhythmia, unspecified   . Diverticulosis of colon (without mention of hemorrhage)   . Dysrhythmia   . Esophageal reflux   . Measles without mention of complication   . Mumps without mention of complication   . Osteoarthrosis, unspecified whether generalized or localized, unspecified site   . 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 Unspecified hyperplasia of prostate without urinary obstruction and other lower urinary tract symptoms (LUTS)     Past Surgical History:  Procedure Laterality Date  . CARDIAC CATHETERIZATION  2006   [facility]   . HERNIA REPAIR    . MITRAL VALVE REPAIR    . rotator cuff repair    . SHOULDER ARTHROSCOPY Left 08/24/2016   Procedure: ARTHROSCOPY SHOULDER with debridement;  Surgeon: Juanell FairlyKevin Krasinski, MD;  Location: [facility] ORS;  Service: Orthopedics;  Laterality: Left;  . TONSILLECTOMY      There were no vitals filed for this visit.      Subjective Assessment - 09/15/16 1138    Subjective Patient reports he is no longer having the pain that kept him up at night. He does have a small ache, but much less than what he was having.    Limitations Lifting;House hold activities   Diagnostic tests  Intra-operative findings indicative of split cuff tendon and full tear.    Patient Stated Goals To be able to avoid another surgery and return to full work duties.    Currently in Pain? Yes   Pain Score 1    Pain Location Shoulder   Pain Orientation Left   Pain Descriptors / Indicators Aching   Pain Type Chronic pain;Surgical pain   Pain Onset More than a month ago      Rows in sitting with 25# x 15, 45# x 6 for 3 sets   ER in supine is WNL to 90 degrees at 90 degrees of abduction   IR to 59 degrees (provided cross body stretch as part of HEP)  Chest press with 5# x 10 repetitions on LUE (challenging) x 3 sets   D2 flexion with yellow band x 6 x 6 on second set (improved form with cuing to not let elbow bend(   90-90 ER with yellow t-band x 6 for 3 sets   Joint mobilizations -- in anterior-posterior direction grade III with no increase in pain reported at varying ranges of abduction x 3 bouts x 3 different angles for 30-45" per bout, well tolerated.   Single arm pull downs 5#x 8, 10# x 8, 15# x 8 for 2 sets  PT Education - 09/15/16 1151    Education provided Yes   Education Details Provided cross body stretch again as part of HEP to address IR deficits.    Person(s) Educated Patient   Methods Explanation;Demonstration;Handout   Comprehension Verbalized understanding;Returned demonstration             PT Long Term Goals - 09/06/16 1219      PT LONG TERM GOAL #1   Title Patient will demonstrate at least 165 degrees of active shoulder flexion to perform overhead activities.    Baseline 142 degrees    Time 8   Period Weeks   Status New     PT LONG TERM GOAL #2   Title Patient will lift at least 10# overhead with no increase in pain to demonstrate increased functional use of LUE.    Baseline Unable to lift weight due to pain.    Time 8   Period Weeks   Status New     PT LONG TERM GOAL #3   Title Patient will report  worst pain of 2/10 in LUE to demonstrate improved tolerance for ADLs.    Baseline 6/10   Time 8   Period Weeks   Status New     PT LONG TERM GOAL #4   Title Patient will report Quick Dash score of less than 25% to demonstrate improved tolerance for ADLs.    Baseline 41%   Time 8   Period Weeks   Status New               Plan - 09/15/16 1151    Clinical Impression Statement Patient continues to improve with overall strength of LUE, though notable for decreased IR ROM, which is likely contributing to his continued (though minor) symptoms. He was provided with cross body stretch for posterior capsule work, will continued to be progressed with generalized L shoulder strengthening for return to work capacity.    Rehab Potential Fair   Clinical Impairments Affecting Rehab Potential Surgeon felt 2nd repair was not appropriate in sx.    PT Frequency 2x / week   PT Duration 6 weeks   PT Treatment/Interventions Aquatic Therapy;Moist Heat;Iontophoresis 4mg /ml Dexamethasone;Cryotherapy;Electrical Stimulation;Therapeutic exercise;Therapeutic activities;Neuromuscular re-education;Taping;Manual techniques;Dry needling   PT Next Visit Plan Progress ROM and tissue loading as indicated by progress.    PT Home Exercise Plan Passive ER stretching, cross body stretch, AAROM with dowel, Y's/T's.    Consulted and Agree with Plan of Care Patient      Patient will benefit from skilled therapeutic intervention in order to improve the following deficits and impairments:  Pain, Improper body mechanics, Decreased range of motion, Decreased strength, Impaired UE functional use  Visit Diagnosis: Chronic left shoulder pain  Muscle weakness (generalized)     Problem List Patient Active Problem List   Diagnosis Date Noted  . Trochanteric bursitis, left hip 02/01/2016  . Pre-diabetes 08/05/2015  . Elevated blood sugar 08/05/2015  . Seasonal allergies 08/05/2015  . Elevated lipids 08/05/2015  .  Chest tightness 06/11/2015  . Cutaneous skin tags 01/05/2015  . GERD without esophagitis 12/07/2014  . BPH (benign prostatic hyperplasia) 12/07/2014  . Diastolic CHF (HCC) 06/09/2014  . Shortness of breath 06/09/2013  . H/O mitral valve repair 06/03/2012  . Heart palpitations 06/03/2012  . Dizziness 06/03/2012   Alva Garnet PT, DPT, CSCS    09/15/2016, 12:18 PM  [facility] PHYSICAL AND SPORTS MEDICINE [address] [place], [address] Phone: [phone]  Fax:  [phone]  Name: Austin Stewart MRN: [id number] Date of Birth: 08/13/1955

## 2016-09-15 NOTE — Patient Instructions (Addendum)
Rows in sitting with 25# x 15, 45# x 6 for 3 sets   ER in supine is WNL to 90 degrees at 90 degrees of abduction   IR to 59 degrees (provided cross body stretch as part of HEP)  Chest press with 5# x 10 repetitions on LUE (challenging) x 3 sets   D2 flexion with yellow band x 6 x 6 on second set (improved form with cuing to not let elbow bend(   90-90 ER with yellow t-band x 6 for 3 sets   Joint mobilizations   Single arm pull downs 5#x 8, 10# x 8, 15# x 8 for 2 sets

## 2016-09-20 ENCOUNTER — Ambulatory Visit: Payer: 59 | Admitting: Physical Therapy

## 2016-09-20 DIAGNOSIS — M25512 Pain in left shoulder: Principal | ICD-10-CM

## 2016-09-20 DIAGNOSIS — G8929 Other chronic pain: Secondary | ICD-10-CM

## 2016-09-20 DIAGNOSIS — M6281 Muscle weakness (generalized): Secondary | ICD-10-CM | POA: Diagnosis not present

## 2016-09-20 NOTE — Therapy (Signed)
[facility] PHYSICAL AND SPORTS MEDICINE [address] [place], [address] Phone: [phone]   Fax:  [phone]  Physical Therapy Treatment  Patient Details  Name: Austin Stewart MRN: [id number] Date of Birth: 1955-07-28 No Data Recorded  Encounter Date: 09/20/2016      PT End of Session - 09/20/16 1242    Visit Number 6   Number of Visits 13   Date for PT Re-Evaluation 10/30/16   PT Start Time 1120   PT Stop Time 1151   PT Time Calculation (min) 31 min   Activity Tolerance Patient tolerated treatment well   Behavior During Therapy [facility] for tasks assessed/performed      Past Medical History:  Diagnosis Date  . Allergic rhinitis, cause unspecified   . Allergy   . Cardiac dysrhythmia, unspecified   . Diverticulosis of colon (without mention of hemorrhage)   . Dysrhythmia   . Esophageal reflux   . Measles without mention of complication   . Mumps without mention of complication   . Osteoarthrosis, unspecified whether generalized or localized, unspecified site   . 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 Unspecified hyperplasia of prostate without urinary obstruction and other lower urinary tract symptoms (LUTS)     Past Surgical History:  Procedure Laterality Date  . CARDIAC CATHETERIZATION  2006   [facility]   . HERNIA REPAIR    . MITRAL VALVE REPAIR    . rotator cuff repair    . SHOULDER ARTHROSCOPY Left 08/24/2016   Procedure: ARTHROSCOPY SHOULDER with debridement;  Surgeon: Juanell Fairly, MD;  Location: [facility] ORS;  Service: Orthopedics;  Laterality: Left;  . TONSILLECTOMY      There were no vitals filed for this visit.      Subjective Assessment - 09/20/16 1123    Subjective Patient reports he is having some limited soreness in the posterior part of his shoulder. He believes this may be partly due to the new crossbody stretch.    Limitations Lifting;House hold activities   Diagnostic tests Intra-operative findings indicative of split cuff tendon and full tear.    Patient Stated Goals To be able to avoid another surgery and return to full work duties.    Currently in Pain? Yes      Flexion/abduction to 160  90-90 ER at 5 pound for eccentric x 3 sets with therapist guiding motion, difficulty achieving end range ER, otherwise well tolerated.   Rows -- 30# x 6 repetitions for 3 sets (appropriate mechanics, no pain reported)   LT raises in prone x1# x 8 repetitions for 3 sets (quite challenging for him to get to end range motion)   Prone horizontal abduction x2# x 8 repetitions for 2 sets (challenging for him to get to end range motion)   Soft tissue mobilization -- performed to initiate session, primarily over biceps/lateral triceps where he was reporting some mild ache/discomfort. With AROM and exercises after completion patient reported no additional soreness.                            PT Education - 09/20/16 1241    Education provided Yes   Education Details Discontinue stretching, will re-assess for pain/strength in followup and if pain has dissipated will likely d/c therapy at that time.    Person(s) Educated Patient   Methods Explanation   Comprehension Verbalized understanding  PT Long Term Goals - 09/06/16 1219      PT LONG TERM GOAL #1   Title Patient will demonstrate at least 165 degrees of active shoulder flexion to perform overhead activities.    Baseline 142 degrees    Time 8   Period Weeks   Status New     PT LONG TERM GOAL #2   Title Patient will lift at least 10# overhead with no increase in pain to demonstrate increased functional use of LUE.    Baseline Unable to lift weight due to pain.    Time 8   Period Weeks   Status New     PT LONG TERM GOAL #3   Title Patient will report worst pain of 2/10 in LUE to demonstrate improved tolerance for ADLs.    Baseline 6/10   Time 8   Period Weeks    Status New     PT LONG TERM GOAL #4   Title Patient will report Quick Dash score of less than 25% to demonstrate improved tolerance for ADLs.    Baseline 41%   Time 8   Period Weeks   Status New               Plan - 09/20/16 1242    Clinical Impression Statement Patient reports slight increase in discomfort with stretching, otherwise he is able to perform all functional activities without complaint. His ROM and strength are excellent, though will discontinue posterior capsule stretch to reduce posterior shoulder discomfort. Will work on discharge instructions in follow up session so long as achiness he is describing dissipates.    Rehab Potential Fair   Clinical Impairments Affecting Rehab Potential Surgeon felt 2nd repair was not appropriate in sx.    PT Frequency 2x / week   PT Duration 6 weeks   PT Treatment/Interventions Aquatic Therapy;Moist Heat;Iontophoresis 4mg /ml Dexamethasone;Cryotherapy;Electrical Stimulation;Therapeutic exercise;Therapeutic activities;Neuromuscular re-education;Taping;Manual techniques;Dry needling   PT Next Visit Plan Progress ROM and tissue loading as indicated by progress.    PT Home Exercise Plan Passive ER stretching, cross body stretch, AAROM with dowel, Y's/T's.    Consulted and Agree with Plan of Care Patient      Patient will benefit from skilled therapeutic intervention in order to improve the following deficits and impairments:  Pain, Improper body mechanics, Decreased range of motion, Decreased strength, Impaired UE functional use  Visit Diagnosis: Chronic left shoulder pain  Muscle weakness (generalized)     Problem List Patient Active Problem List   Diagnosis Date Noted  . Trochanteric bursitis, left hip 02/01/2016  . Pre-diabetes 08/05/2015  . Elevated blood sugar 08/05/2015  . Seasonal allergies 08/05/2015  . Elevated lipids 08/05/2015  . Chest tightness 06/11/2015  . Cutaneous skin tags 01/05/2015  . GERD without  esophagitis 12/07/2014  . BPH (benign prostatic hyperplasia) 12/07/2014  . Diastolic CHF (HCC) 06/09/2014  . Shortness of breath 06/09/2013  . H/O mitral valve repair 06/03/2012  . Heart palpitations 06/03/2012  . Dizziness 06/03/2012   Alva GarnetPatrick McNamara PT, DPT, CSCS    09/20/2016, 12:45 PM  [facility]TS MEDICINE [address] [place], [place], [address] Phone: [phone]   Fax:  [phone]  Name: Austin Stewart MRN: [id number] Date of Birth: 06/07/1955

## 2016-09-20 NOTE — Patient Instructions (Signed)
Flexion/abduction to 160  90-90 ER at 5 pound for eccentric   Rows  LT raises in prone   Prone horizontal abduction

## 2016-09-22 ENCOUNTER — Ambulatory Visit: Payer: 59 | Admitting: Physical Therapy

## 2016-09-22 DIAGNOSIS — M6281 Muscle weakness (generalized): Secondary | ICD-10-CM | POA: Diagnosis not present

## 2016-09-22 DIAGNOSIS — G8929 Other chronic pain: Secondary | ICD-10-CM

## 2016-09-22 DIAGNOSIS — M25512 Pain in left shoulder: Secondary | ICD-10-CM | POA: Diagnosis not present

## 2016-09-22 NOTE — Therapy (Signed)
[facility] PHYSICAL AND SPORTS MEDICINE [address] [place], [place], [address] Phone: [phone]   Fax:  [phone]  Physical Therapy Treatment  Patient Details  Name: Austin Stewart MRN: [id number] Date of Birth: 07/29/1955 No Data Recorded  Encounter Date: 09/22/2016      PT End of Session - 09/22/16 1155    Visit Number 7   Number of Visits 13   Date for PT Re-Evaluation 10/30/16   PT Start Time 1118   PT Stop Time 1158   PT Time Calculation (min) 40 min   Activity Tolerance Patient tolerated treatment well   Behavior During Therapy [facility] for tasks assessed/performed      Past Medical History:  Diagnosis Date  . Allergic rhinitis, cause unspecified   . Allergy   . Cardiac dysrhythmia, unspecified   . Diverticulosis of colon (without mention of hemorrhage)   . Dysrhythmia   . Esophageal reflux   . Measles without mention of complication   . Mumps without mention of complication   . Osteoarthrosis, unspecified whether generalized or localized, unspecified site   . 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 Unspecified hyperplasia of prostate without urinary obstruction and other lower urinary tract symptoms (LUTS)     Past Surgical History:  Procedure Laterality Date  . CARDIAC CATHETERIZATION  2006   [facility]   . HERNIA REPAIR    . MITRAL VALVE REPAIR    . rotator cuff repair    . SHOULDER ARTHROSCOPY Left 08/24/2016   Procedure: ARTHROSCOPY SHOULDER with debridement;  Surgeon: Juanell FairlyKevin Krasinski, MD;  Location: [facility] ORS;  Service: Orthopedics;  Laterality: Left;  . TONSILLECTOMY      There were no vitals filed for this visit.      Subjective Assessment - 09/22/16 1144    Subjective Patient reports very mild intermittent soreness over lateral arm, he is happy with his progress and has been off pain medications now for several days. He has not tried all of his ADLs yet, but has been  regularly using his arm.    Limitations Lifting;House hold activities   Diagnostic tests Intra-operative findings indicative of split cuff tendon and full tear.    Patient Stated Goals To be able to avoid another surgery and return to full work duties.    Currently in Pain? No/denies      ER with blue t-band x 15 (had done green band previously with mild burn but not fatigue)   TM Push ups x 12 (challenging, but appropriate through ROM) floor push ups were too demanding on his wrists which have limited extension ROM.   Total gym rows x 10 at level 17   Farmer's carry - 100' with 20# DB   Lifting mechanics with box and 10# to above waist height cuing to keep box near his stomach -- no pain reported, able to correct mechanics with cuing.   Bent over rows with 7# x 10 (did not feel at all), 10# x10, more appropriate loading.   Able to complete shoulder press with 10# weight, though difficult.   Quick Dash score of 13.6%                            PT Education - 09/22/16 1158    Education provided Yes   Education Details Provided HEP, follow up in 3 weeks.    Person(s) Educated Patient  Methods Explanation;Demonstration;Handout   Comprehension Verbalized understanding;Returned demonstration             PT Long Term Goals - 09/22/16 1158      PT LONG TERM GOAL #1   Title Patient will demonstrate at least 165 degrees of active shoulder flexion to perform overhead activities.    Baseline 142 degrees    Time 8   Period Weeks   Status Achieved     PT LONG TERM GOAL #2   Title Patient will lift at least 10# overhead with no increase in pain to demonstrate increased functional use of LUE.    Baseline Unable to lift weight due to pain.    Time 8   Period Weeks   Status Achieved     PT LONG TERM GOAL #3   Title Patient will report worst pain of 2/10 in LUE to demonstrate improved tolerance for ADLs.    Baseline 6/10   Time 8   Period Weeks    Status Achieved     PT LONG TERM GOAL #4   Title Patient will report Quick Dash score of less than 25% to demonstrate improved tolerance for ADLs.    Baseline 41%   Time 8   Period Weeks   Status Achieved               Plan - 09/22/16 1155    Clinical Impression Statement Patient has made excellent progress with ROM, strength, and pain control. He is satisfied with all phases of rehab and using his LUE for ADLs. He does report it still feels weak, thus provided intensive strengthening program. He will return in 2-3 weeks for follow up for discharge.    Rehab Potential Fair   Clinical Impairments Affecting Rehab Potential Surgeon felt 2nd repair was not appropriate in sx.    PT Frequency 2x / week   PT Duration 6 weeks   PT Treatment/Interventions Aquatic Therapy;Moist Heat;Iontophoresis 4mg /ml Dexamethasone;Cryotherapy;Electrical Stimulation;Therapeutic exercise;Therapeutic activities;Neuromuscular re-education;Taping;Manual techniques;Dry needling   PT Next Visit Plan Progress ROM and tissue loading as indicated by progress.    PT Home Exercise Plan Passive ER stretching, cross body stretch, AAROM with dowel, Y's/T's.    Consulted and Agree with Plan of Care Patient      Patient will benefit from skilled therapeutic intervention in order to improve the following deficits and impairments:  Pain, Improper body mechanics, Decreased range of motion, Decreased strength, Impaired UE functional use  Visit Diagnosis: Chronic left shoulder pain  Muscle weakness (generalized)     Problem List Patient Active Problem List   Diagnosis Date Noted  . Trochanteric bursitis, left hip 02/01/2016  . Pre-diabetes 08/05/2015  . Elevated blood sugar 08/05/2015  . Seasonal allergies 08/05/2015  . Elevated lipids 08/05/2015  . Chest tightness 06/11/2015  . Cutaneous skin tags 01/05/2015  . GERD without esophagitis 12/07/2014  . BPH (benign prostatic hyperplasia) 12/07/2014  . Diastolic  CHF (HCC) 06/09/2014  . Shortness of breath 06/09/2013  . H/O mitral valve repair 06/03/2012  . Heart palpitations 06/03/2012  . Dizziness 06/03/2012   Alva Garnet PT, DPT, CSCS    09/22/2016, 2:02 PM  [place] [facility] PHYSICAL AND SPORTS MEDICINE [address], [place], [address] Phone: [phone]   Fax:  [phone]  Name: Austin Stewart MRN: [id number] Date of Birth: March 05, 1956

## 2016-09-22 NOTE — Patient Instructions (Addendum)
ER with blue t-band x 15 (had done green band previously with mild burn but not fatigue)   TM Push ups   Total gym rows x 10   Farmer's carry - 100' with 20# DB   Lifting mechanics with box and 10# to above waist height cuing to keep box near his stomach   Bent over rows with 7# x 10 (did not feel at all), 10#   Able to complete shoulder press with 10# weight, though difficult.   Quick Dash score of 13.6%

## 2016-09-27 ENCOUNTER — Ambulatory Visit: Payer: 59 | Admitting: Physical Therapy

## 2016-09-29 ENCOUNTER — Ambulatory Visit: Payer: 59 | Admitting: Physical Therapy

## 2016-10-09 DIAGNOSIS — G8929 Other chronic pain: Secondary | ICD-10-CM | POA: Diagnosis not present

## 2016-10-09 DIAGNOSIS — M75122 Complete rotator cuff tear or rupture of left shoulder, not specified as traumatic: Secondary | ICD-10-CM | POA: Diagnosis not present

## 2016-10-09 DIAGNOSIS — M19012 Primary osteoarthritis, left shoulder: Secondary | ICD-10-CM | POA: Diagnosis not present

## 2016-10-09 DIAGNOSIS — M25512 Pain in left shoulder: Secondary | ICD-10-CM | POA: Diagnosis not present

## 2016-10-13 ENCOUNTER — Ambulatory Visit: Payer: 59 | Admitting: Physical Therapy

## 2016-10-17 ENCOUNTER — Ambulatory Visit (INDEPENDENT_AMBULATORY_CARE_PROVIDER_SITE_OTHER): Payer: 59 | Admitting: Family Medicine

## 2016-10-17 ENCOUNTER — Encounter: Payer: Self-pay | Admitting: Family Medicine

## 2016-10-17 ENCOUNTER — Telehealth: Payer: Self-pay | Admitting: Family Medicine

## 2016-10-17 VITALS — BP 113/75 | HR 65 | Temp 98.3°F | Resp 16 | Ht 70.0 in | Wt 194.0 lb

## 2016-10-17 DIAGNOSIS — N451 Epididymitis: Secondary | ICD-10-CM

## 2016-10-17 DIAGNOSIS — R1032 Left lower quadrant pain: Secondary | ICD-10-CM | POA: Diagnosis not present

## 2016-10-17 MED ORDER — LEVOFLOXACIN 500 MG PO TABS: 500.0000 mg | ORAL_TABLET | Freq: Every day | ORAL | 0 refills | Status: DC

## 2016-10-17 NOTE — Telephone Encounter (Signed)
Called [facility] to review drug drug interaction, Levaquin and Flecanide with increased risk Prolong QTc, discussed this with patient at visit, see note for details. We reviewed warning signs and symptoms and when to seek care emergently if needed, he agrees to these risks and I spoke with Pam at pharmacy regarding this conversation. No changes at this time. If needed alternative would consider Doxycycline 100mg  BID for 10 days.  Saralyn PilarAlexander Leonidas Boateng, DO [facility] 10/17/2016, 5:11 PM

## 2016-10-17 NOTE — Patient Instructions (Addendum)
Thank you for coming to the clinic today.  1. You most likely have epididymitis of Left testicle, this is an infection of epididymis or tube at top of testicle that is related to sperm and ejaculation. This can be caused after regular sexual intercourse, often from normal bacteria found on our bodies.  Start antibiotic Levaquin 500mg  daily for 10 days - as discussed prefer to take in afternoon , not with flecanide, check with pharmacist and be aware that this can cause interaction, if you develop new acute chest pain, dyspnea, arrhythmia heart flutter or passing or other severe symptoms seek help immediately hospital ED  Go ahead and start anti inflammatory, ibuprofen / motrin / aleve / naproxen take with food few times daily as needed, follow-up bottle instructions. May take Tylenol. Avoid over exertion heavy lifting, and sexual intercourse for 2 weeks.  If symptoms change, to hernia or abdominal pain with blood in stool or fevers/chills, can notify office.  Please schedule a Follow-up Appointment to: Return in about 1 week (around 10/24/2016) for As scheduled for Annual Physical.  If you have any other questions or concerns, please feel free to call the clinic or send a message through MyChart. You may also schedule an earlier appointment if necessary.  Additionally, you may be receiving a survey about your experience at our clinic within a few days to 1 week by e-mail or mail. We value your feedback.  Saralyn PilarAlexander Meilah Delrosario, DO [facility]   Epididymitis Epididymitis is swelling (inflammation) of the epididymis. The epididymis is a cord-like structure that is located along the top and back part of the testicle. It collects and stores sperm from the testicle. This condition can also cause pain and swelling of the testicle and scrotum. Symptoms usually start suddenly (acute epididymitis). Sometimes epididymitis starts gradually and lasts for a while (chronic epididymitis).  This type may be harder to treat. What are the causes? In men 8335 and younger, this condition is usually caused by a bacterial infection or sexually transmitted disease (STD), such as:  Gonorrhea.  Chlamydia.  In men 3135 and older who do not have anal sex, this condition is usually caused by bacteria from a blockage or abnormalities in the urinary system. These can result from:  Having a tube placed into the bladder (urinary catheter).  Having an enlarged or inflamed prostate gland.  Having recent urinary tract surgery.  In men who have a condition that weakens the body's defense system (immune system), such as HIV, this condition can be caused by:  Other bacteria, including tuberculosis and syphilis.  Viruses.  Fungi.  Sometimes this condition occurs without infection. That may happen if urine flows backward into the epididymis after heavy lifting or straining. What increases the risk? This condition is more likely to develop in men:  Who have unprotected sex with more than one partner.  Who have anal sex.  Who have recently had surgery.  Who have a urinary catheter.  Who have urinary problems.  Who have a suppressed immune system.  What are the signs or symptoms? This condition usually begins suddenly with chills, fever, and pain behind the scrotum and in the testicle. Other symptoms include:  Swelling of the scrotum, testicle, or both.  Pain whenejaculatingor urinating.  Pain in the back or belly.  Nausea.  Itching and discharge from the penis.  Frequent need to pass urine.  Redness and tenderness of the scrotum.  How is this diagnosed? Your health care provider can diagnose  this condition based on your symptoms and medical history. Your health care provider will also do a physical exam to ask about your symptoms and check your scrotum and testicle for swelling, pain, and redness. You may also have other tests, including:  Examination of discharge from  the penis.  Urine tests for infections, such as STDs.  Your health care provider may test you for other STDs, including HIV. How is this treated? Treatment for this condition depends on the cause. If your condition is caused by a bacterial infection, oral antibiotic medicine may be prescribed. If the bacterial infection has spread to your blood, you may need to receive IV antibiotics. Nonbacterial epididymitis is treated with home care that includes bed rest and elevation of the scrotum. Surgery may be needed to treat:  Bacterial epididymitis that causes pus to build up in the scrotum (abscess).  Chronic epididymitis that has not responded to other treatments.  Follow these instructions at home: Medicines  Take over-the-counter and prescription medicines only as told by your health care provider.  If you were prescribed an antibiotic medicine, take it as told by your health care provider. Do not stop taking the antibiotic even if your condition improves. Sexual Activity  If your epididymitis was caused by an STD, avoid sexual activity until your treatment is complete.  Inform your sexual partner or partners if you test positive for an STD. They may need to be treated.Do not engage in sexual activity with your partner or partners until their treatment is completed. General instructions  Return to your normal activities as told by your health care provider. Ask your health care provider what activities are safe for you.  Keep your scrotum elevated and supported while resting. Ask your health care provider if you should wear a scrotal support, such as a jockstrap. Wear it as told by your health care provider.  If directed, apply ice to the affected area: ? Put ice in a plastic bag. ? Place a towel between your skin and the bag. ? Leave the ice on for 20 minutes, 2-3 times per day.  Try taking a sitz bath to help with discomfort. This is a warm water bath that is taken while you are  sitting down. The water should only come up to your hips and should cover your buttocks. Do this 3-4 times per day or as told by your health care provider.  Keep all follow-up visits as told by your health care provider. This is important. Contact a health care provider if:  You have a fever.  Your pain medicine is not helping.  Your pain is getting worse.  Your symptoms do not improve within three days. This information is not intended to replace advice given to you by your health care provider. Make sure you discuss any questions you have with your health care provider. Document Released: 04/14/2000 Document Revised: 09/23/2015 Document Reviewed: 09/02/2014 Elsevier Interactive Patient Education  2018 [employer].

## 2016-10-17 NOTE — Telephone Encounter (Signed)
Austin Stewart with [facility] said the was a drug interaction with the levaquin and flecainide.  Please call [phone]

## 2016-10-17 NOTE — Progress Notes (Signed)
Subjective:    Patient ID: Austin MarrowRobert D Stewart, male    DOB: 10/07/1955, 61 y.o.   MRN: [id number]  Austin Stewart is a 61 y.o. male presenting on 10/17/2016 for Groin Pain (onset 5 days )   HPI   Left Groin / Testicular Pain: - Last visit with me 01/2016 for L hip pain, dx with trochanteric bursitis, has since resolved, now this is new complaint, not feels similar - Reports symptoms started about 4-5 days ago with Left groin / inguinal region, had some episodic or more persistent pain aching/burning severity 4-5 / 10, then symptoms improved but did not completely resolve, about 1-2 days later he was taking shower and noticed both testicles but mostly left were "sore" and more "sensitive", then symptoms gradually improving in past 48 hours, left groin pain was mostly gone and testicle pain almost resolved. Worse with prolonged sitting on hard stool (noticed today), also aching after sitting in church on Sunday. Otherwise not worsened by anything else in particular. Seems improved with standing and walking or stretching. - Have not tried Tylenol, Ibuprofen. Previously was on Meloxicam and muscle relaxant for other ortho problems Shoulder in past, no longer on these medications - Has history of diverticulitis in past, initially felt similar but now states this feels different, not actual abdominal pain - Prior history of bilateral higher groin hernia s/p surgical repair by mesh, about 8-10 years ago, again this feels different than herniation - He reports regular sexual intercourse with wife at least monthly, without condom use, no known prior STD exposure. He thinks onset that evening or next day after last intercourse - Admits loose stool, and sensation of needing to have BM with some discomfort, prior episode of some darker stools - Denies any fevers/chills, sweats, testicular swelling redness bump or mass, dark stool or blood in stool   Social History  Substance Use Topics  . Smoking status:  Never Smoker  . Smokeless tobacco: Never Used  . Alcohol use No    Review of Systems Per HPI unless specifically indicated above     Objective:    BP 113/75   Pulse 65   Temp 98.3 F (36.8 C) (Oral)   Resp 16   Ht 5\' 10"  (1.778 m)   Wt 194 lb (88 kg)   BMI 27.84 kg/m   Wt Readings from Last 3 Encounters:  10/17/16 194 lb (88 kg)  08/17/16 185 lb (83.9 kg)  06/19/16 191 lb 8 oz (86.9 kg)    Physical Exam  Constitutional: He appears well-developed and well-nourished. No distress.  Well-appearing, comfortable, cooperative  HENT:  Head: Normocephalic and atraumatic.  Mouth/Throat: Oropharynx is clear and moist.  Eyes: Conjunctivae are normal.  Cardiovascular: Normal rate and intact distal pulses.   Pulmonary/Chest: Effort normal.  Abdominal: Soft. Bowel sounds are normal. He exhibits no distension and no mass. There is no tenderness. There is no rebound and no guarding.  Genitourinary: Penis normal. No penile tenderness.  Genitourinary Comments: External Male Genitalia - Normal appearance, without obvious scrotal edema or erythema, penis normal, without swelling or discharge - Left testicle palpable moderate tenderness at superior pole of epididymis with palpable mild swelling of spermatic cord structure  Inguinal Hernia Exam: - very mild increased fullness L>R pubic region, L inguinal canal on valsalva palpable increased movement without obvious herniation. Normal right side exam    Neurological: He is alert.  Skin: Skin is warm and dry. He is not diaphoretic.  Nursing note and vitals  reviewed.  Results for orders placed or performed during the hospital encounter of 08/21/16  APTT  Result Value Ref Range   aPTT 27 24 - 36 seconds  Basic metabolic panel  Result Value Ref Range   Sodium 139 135 - 145 mmol/L   Potassium 3.7 3.5 - 5.1 mmol/L   Chloride 107 101 - 111 mmol/L   CO2 24 22 - 32 mmol/L   Glucose, Bld 117 (H) 65 - 99 mg/dL   BUN 15 6 - 20 mg/dL    Creatinine, Ser 1.61 0.61 - 1.24 mg/dL   Calcium 9.1 8.9 - 09.6 mg/dL   GFR calc non Af Amer >60 >60 mL/min   GFR calc Af Amer >60 >60 mL/min   Anion gap 8 5 - 15  CBC WITH DIFFERENTIAL  Result Value Ref Range   WBC 8.4 3.8 - 10.6 K/uL   RBC 4.95 4.40 - 5.90 MIL/uL   Hemoglobin 15.0 13.0 - 18.0 g/dL   HCT 04.5 40.9 - 81.1 %   MCV 87.6 80.0 - 100.0 fL   MCH 30.3 26.0 - 34.0 pg   MCHC 34.6 32.0 - 36.0 g/dL   RDW 91.4 78.2 - 95.6 %   Platelets 372 150 - 440 K/uL   Neutrophils Relative % 67 %   Neutro Abs 5.6 1.4 - 6.5 K/uL   Lymphocytes Relative 22 %   Lymphs Abs 1.8 1.0 - 3.6 K/uL   Monocytes Relative 7 %   Monocytes Absolute 0.6 0.2 - 1.0 K/uL   Eosinophils Relative 3 %   Eosinophils Absolute 0.2 0 - 0.7 K/uL   Basophils Relative 1 %   Basophils Absolute 0.1 0 - 0.1 K/uL  Protime-INR  Result Value Ref Range   Prothrombin Time 12.9 11.4 - 15.2 seconds   INR 0.97       Assessment & Plan:   Problem List Items Addressed This Visit    None    Visit Diagnoses    Epididymitis, left    -  Primary  Clinically consistent with L epididymitis based on exam and location of pain and history. Likely bacterial, usually E Coli given age range, sexually active (wife, monogamous, no condom use). Considered prostatitis, but seems less likely - Concern for potential symptoms from recurrence of old hernia, but no significant actual herniation on exam and onset/duration not consistent - Unlikely other differential options diverticulitis, L hip/groin pain [facility]  Plan: 1. Start antibiotics with Levaquin 500mg  daily x 10 days - based on antibiotic selection guidelines and effectiveness for epididymitis also cover prostatitis. 2. Discussed with patient, agrees with risk with drug drug interaction on his Flecanide for arrhythmia with inc risk of prolong QTc, has been on Cipro before without problem, aware of warning symptoms when to seek additional help or emergent if symptoms. Reviewed this with  pharmacy today. 3. May take NSAIDs PRN, rest ice, avoid overstrain, lifting, sexual intercourse for 2 weeks 4. Follow-up as scheduled within 1-2 weeks     Relevant Medications   levofloxacin (LEVAQUIN) 500 MG tablet   Left groin pain     - Secondary to epididymitis most likely, clinically not consistent with trochanteric pain or L hip pain. Also less likely abdominal pain based on exam      Meds ordered this encounter  Medications  . DISCONTD: meloxicam (MOBIC) 15 MG tablet    Sig: meloxicam 15 mg tablet  . levofloxacin (LEVAQUIN) 500 MG tablet    Sig: Take 1 tablet (500 mg total) by  mouth daily. For 10 days    Dispense:  10 tablet    Refill:  0    Follow up plan: Return in about 1 week (around 10/24/2016) for As scheduled for Annual Physical.   Saralyn Pilar, DO [facility] 10/17/2016, 9:55 PM

## 2016-10-26 ENCOUNTER — Ambulatory Visit (INDEPENDENT_AMBULATORY_CARE_PROVIDER_SITE_OTHER): Payer: 59 | Admitting: Family Medicine

## 2016-10-26 ENCOUNTER — Encounter: Payer: Self-pay | Admitting: Family Medicine

## 2016-10-26 VITALS — BP 102/71 | HR 63 | Temp 98.0°F | Ht 70.0 in | Wt 190.8 lb

## 2016-10-26 DIAGNOSIS — Z9889 Other specified postprocedural states: Secondary | ICD-10-CM | POA: Diagnosis not present

## 2016-10-26 DIAGNOSIS — J3089 Other allergic rhinitis: Secondary | ICD-10-CM

## 2016-10-26 DIAGNOSIS — Z1159 Encounter for screening for other viral diseases: Secondary | ICD-10-CM | POA: Diagnosis not present

## 2016-10-26 DIAGNOSIS — R7303 Prediabetes: Secondary | ICD-10-CM | POA: Diagnosis not present

## 2016-10-26 DIAGNOSIS — I5032 Chronic diastolic (congestive) heart failure: Secondary | ICD-10-CM | POA: Diagnosis not present

## 2016-10-26 DIAGNOSIS — Z0000 Encounter for general adult medical examination without abnormal findings: Secondary | ICD-10-CM | POA: Diagnosis not present

## 2016-10-26 DIAGNOSIS — N529 Male erectile dysfunction, unspecified: Secondary | ICD-10-CM

## 2016-10-26 DIAGNOSIS — N451 Epididymitis: Secondary | ICD-10-CM

## 2016-10-26 DIAGNOSIS — Z23 Encounter for immunization: Secondary | ICD-10-CM

## 2016-10-26 MED ORDER — ZOSTER VAC RECOMB ADJUVANTED 50 MCG/0.5ML IM SUSR: 0.5000 mL | INTRAMUSCULAR | 1 refills | Status: DC

## 2016-10-26 MED ORDER — SILDENAFIL CITRATE 20 MG PO TABS: ORAL_TABLET | ORAL | 1 refills | Status: DC

## 2016-10-26 MED ORDER — MONTELUKAST SODIUM 10 MG PO TABS: 10.0000 mg | ORAL_TABLET | Freq: Every day | ORAL | 3 refills | Status: DC

## 2016-10-26 MED ORDER — FLUTICASONE PROPIONATE 50 MCG/ACT NA SUSP: 2.0000 | Freq: Every day | NASAL | 3 refills | Status: DC

## 2016-10-26 NOTE — Progress Notes (Signed)
Subjective:    Patient ID: Austin Stewart, male    DOB: Mar 05, 1956, 61 y.o.   MRN: [id number]  Austin Stewart is a 61 y.o. male presenting on 10/26/2016 for Annual Exam   HPI   Pre-Diabetes: Reports prior A1c trend 5.7 to 5.9 then improved 5.4 > 5.2 most recently, no concerns CBGs: not checking CBG Meds: Never on meds Currently not on ACEi / ARB Denies hypoglycemia, polyuria, visual changes, numbness or tingling.  Seasonal Allergies: - Taking Singulair, if doesn't take will get worse allergies and congestion - Flonase, off currently was on OTC  S/p Mitral Valve Repair 2006 maze procedure, secondary arrhythmia, history of diastolic CHF Reports no new concerns, followed by Dr Mariah Milling Current Meds - Metoprolol XL 25mg  daily, Flecainide Reports good compliance, took meds today. Tolerating well, w/o complaints. - Taking ASA 81mg  Denies CP, dyspnea, HA, edema, dizziness / lightheadedness  L Epidymitis - resolved, now pain free - Recently seen 10 days ago in office by me for L testicular pain dx with epididymitis, treated with Levaquin 500mg  daily x 10 days. He finished this med, tolerated well maybe had slight palpitation but overall no major concerns, as discussed he was on flecainide and agreed to risk of potential prolonged QTc - He stated also after treatment felt better within 3 days on antibiotic, he even felt like he was voiding better, maybe thought prostate was inflamed as well - Denies any testicular pain, swelling, fevers/chills  Erectile Dysfunction: - Reports new concern with difficulty getting full erection every time, still can get one, sometimes partial, but mostly problem with maintaining erection. Also problem with retrograde ejaculation would like to see Urologist - Admits to good sexual desire  Health Maintenance: - Not due pneumonia vaccine, changed chart, only pre-diabetes, start at 70 Prevnar-13 - Asking about shingles, never had Zostavax, would like rx  Shingrix series x 2 q 68mo - Colon CA Screening: Last colonoscopy in [place] in 2010, was told some polyps but benign, and diverticulosis, return in 10 years, next 2020 - Prostate CA Screening: Prior PSA normal 0.4 (unchanged in 2 years, last 06/2016), prior DRE mildly enlarged without asymmetry or lumps by report  No known family history of prostate or colon cancer. Father had prostate problem, but did not know if cancer.  Past Medical History:  Diagnosis Date  . Allergic rhinitis, cause unspecified   . Allergy   . Cardiac dysrhythmia, unspecified   . Diverticulosis of colon (without mention of hemorrhage)   . Dysrhythmia   . Esophageal reflux   . Measles without mention of complication   . Mumps without mention of complication   . Osteoarthrosis, unspecified whether generalized or localized, unspecified site   . 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 Unspecified hyperplasia of prostate without urinary obstruction and other lower urinary tract symptoms (LUTS)    Past Surgical History:  Procedure Laterality Date  . CARDIAC CATHETERIZATION  2006   [facility]   . HERNIA REPAIR    . MITRAL VALVE REPAIR    . rotator cuff repair    . SHOULDER ARTHROSCOPY Left 08/24/2016   Procedure: ARTHROSCOPY SHOULDER with debridement;  Surgeon: Juanell Fairly, MD;  Location: [facility] ORS;  Service: Orthopedics;  Laterality: Left;  . TONSILLECTOMY     Social History   Social History  . Marital status: Married    Spouse name: N/A  . Number of children: N/A  . Years of education:  N/A   Occupational History  . Not on file.   Social History Main Topics  . Smoking status: Never Smoker  . Smokeless tobacco: Never Used  . Alcohol use No  . Drug use: No  . Sexual activity: Not on file   Other Topics Concern  . Not on file   Social History Narrative  . No narrative on file   Family History  Problem Relation Age of Onset  . Heart disease Mother        s/p  stent placement  . Hypertension Mother   . Emphysema Father   . Dementia Father   . Cancer Maternal Uncle        kidney cancer   Current Outpatient Prescriptions on File Prior to Visit  Medication Sig  . aspirin 81 MG tablet Take 81 mg by mouth daily.  . flecainide (TAMBOCOR) 50 MG tablet TAKE 1 TABLET BY MOUTH 2 TIMES DAILY.  Marland Kitchen loratadine (CLARITIN) 10 MG tablet Take 1 tablet (10 mg total) by mouth daily.  . metoprolol succinate (TOPROL-XL) 25 MG 24 hr tablet TAKE 1 TABLET BY MOUTH DAILY.  . Multiple Vitamins-Minerals (MULTIVITAMIN WITH MINERALS) tablet Take 1 tablet by mouth daily.  . naphazoline-pheniramine ([employer] EYE ALLERGY RELIEF) 0.025-0.3 % ophthalmic solution Place 1 drop into both eyes 4 (four) times daily as needed for irritation.  Marland Kitchen omeprazole (PRILOSEC) 20 MG capsule TAKE 1 CAPSULE BY MOUTH DAILY.  . sodium chloride (OCEAN) 0.65 % SOLN nasal spray Place 1 spray into both nostrils as needed for congestion.  . tamsulosin (FLOMAX) 0.4 MG CAPS capsule TAKE 1 CAPSULE BY MOUTH DAILY.  . Turmeric 500 MG CAPS Take 2 capsules by mouth daily.   No current facility-administered medications on file prior to visit.     Review of Systems  Constitutional: Negative for activity change, appetite change, chills, diaphoresis, fatigue and fever.  HENT: Negative for congestion, hearing loss and sinus pressure.   Eyes: Negative for visual disturbance.  Respiratory: Negative for cough, chest tightness, shortness of breath and wheezing.   Cardiovascular: Negative for chest pain, palpitations and leg swelling.  Gastrointestinal: Negative for abdominal pain, constipation, diarrhea, nausea and vomiting.  Endocrine: Negative for cold intolerance and polyuria.  Genitourinary: Negative for decreased urine volume, difficulty urinating, dysuria, frequency, hematuria, penile swelling, scrotal swelling and testicular pain.  Musculoskeletal: Negative for arthralgias and neck pain.  Skin: Negative for rash.    Allergic/Immunologic: Positive for environmental allergies.  Neurological: Negative for dizziness, weakness, light-headedness, numbness and headaches.  Hematological: Negative for adenopathy.  Psychiatric/Behavioral: Negative for behavioral problems, dysphoric mood and sleep disturbance.   Per HPI unless specifically indicated above     Objective:    BP 102/71 (BP Location: Left Arm, Patient Position: Sitting, Cuff Size: Normal)   Pulse 63   Temp 98 F (36.7 C) (Oral)   Ht 5\' 10"  (1.778 m)   Wt 190 lb 12.8 oz (86.5 kg)   BMI 27.38 kg/m   Wt Readings from Last 3 Encounters:  10/26/16 190 lb 12.8 oz (86.5 kg)  10/17/16 194 lb (88 kg)  08/17/16 185 lb (83.9 kg)    Physical Exam  Constitutional: He is oriented to person, place, and time. He appears well-developed and well-nourished. No distress.  Well-appearing, comfortable, cooperative  HENT:  Head: Normocephalic and atraumatic.  Mouth/Throat: Oropharynx is clear and moist.  Eyes: Conjunctivae and EOM are normal. Pupils are equal, round, and reactive to light. Right eye exhibits no discharge. Left eye exhibits no  discharge.  Neck: Normal range of motion. Neck supple. No thyromegaly present.  Cardiovascular: Normal rate, regular rhythm, normal heart sounds and intact distal pulses.   No murmur heard. Pulmonary/Chest: Effort normal and breath sounds normal. No respiratory distress. He has no wheezes. He has no rales.  Abdominal: Soft. Bowel sounds are normal. He exhibits no distension and no mass. There is no tenderness.  Musculoskeletal: Normal range of motion. He exhibits no edema or tenderness.  Upper / Lower Extremities: - Normal muscle tone, strength bilateral upper extremities 5/5, lower extremities 5/5  Lymphadenopathy:    He has no cervical adenopathy.  Neurological: He is alert and oriented to person, place, and time.  Distal sensation intact to light touch all extremities  Skin: Skin is warm and dry. No rash noted. He  is not diaphoretic. No erythema.  Psychiatric: He has a normal mood and affect. His behavior is normal.  Well groomed, good eye contact, normal speech and thoughts  Nursing note and vitals reviewed.    Results for orders placed or performed during the hospital encounter of 08/21/16  APTT  Result Value Ref Range   aPTT 27 24 - 36 seconds  Basic metabolic panel  Result Value Ref Range   Sodium 139 135 - 145 mmol/L   Potassium 3.7 3.5 - 5.1 mmol/L   Chloride 107 101 - 111 mmol/L   CO2 24 22 - 32 mmol/L   Glucose, Bld 117 (H) 65 - 99 mg/dL   BUN 15 6 - 20 mg/dL   Creatinine, Ser 1.61 0.61 - 1.24 mg/dL   Calcium 9.1 8.9 - 09.6 mg/dL   GFR calc non Af Amer >60 >60 mL/min   GFR calc Af Amer >60 >60 mL/min   Anion gap 8 5 - 15  CBC WITH DIFFERENTIAL  Result Value Ref Range   WBC 8.4 3.8 - 10.6 K/uL   RBC 4.95 4.40 - 5.90 MIL/uL   Hemoglobin 15.0 13.0 - 18.0 g/dL   HCT 04.5 40.9 - 81.1 %   MCV 87.6 80.0 - 100.0 fL   MCH 30.3 26.0 - 34.0 pg   MCHC 34.6 32.0 - 36.0 g/dL   RDW 91.4 78.2 - 95.6 %   Platelets 372 150 - 440 K/uL   Neutrophils Relative % 67 %   Neutro Abs 5.6 1.4 - 6.5 K/uL   Lymphocytes Relative 22 %   Lymphs Abs 1.8 1.0 - 3.6 K/uL   Monocytes Relative 7 %   Monocytes Absolute 0.6 0.2 - 1.0 K/uL   Eosinophils Relative 3 %   Eosinophils Absolute 0.2 0 - 0.7 K/uL   Basophils Relative 1 %   Basophils Absolute 0.1 0 - 0.1 K/uL  Protime-INR  Result Value Ref Range   Prothrombin Time 12.9 11.4 - 15.2 seconds   INR 0.97     Recent Labs  12/14/15 1508 06/16/16 0001  HGBA1C 5.7% 5.2       Assessment & Plan:   Problem List Items Addressed This Visit    Seasonal allergies    Refill Singulair, Flonase      Relevant Medications   montelukast (SINGULAIR) 10 MG tablet   fluticasone (FLONASE) 50 MCG/ACT nasal spray   Pre-diabetes    Well-controlled Pre-DM with A1c 5.2 (stable to improved from 5.7 to 5.9 down 5.2)  Plan:  1. Not on any therapy currently  2.  Encourage improved lifestyle - low carb, low sugar diet, reduce portion size, continue improving regular exercise 3. Follow-up q 6 mo  A1c      Relevant Orders   Hemoglobin A1c   H/O mitral valve repair    Stable Remains on BB and Flecainide for ectopy Followed by Cardiology      Diastolic CHF (HCC)    Followed by Cardiology      Relevant Medications   sildenafil (REVATIO) 20 MG tablet    Other Visit Diagnoses    Annual physical exam    -  Primary   Need for hepatitis C screening test       Relevant Orders   Hepatitis C antibody   Need for shingles vaccine       No prior zostavax. Ordered printed Shingrix, pt to call ins coverage, x 2 doses q 6 mo apart   Relevant Medications   Zoster Vac Recomb Adjuvanted [facility](SHINGRIX) injection   Erectile dysfunction, unspecified erectile dysfunction type       Relevant Medications   sildenafil (REVATIO) 20 MG tablet   Other Relevant Orders   Ambulatory referral to Urology   Epididymitis, left       Resolved on levaquin. Still referral to Urology for ED, also possible prostatitis vs BPH   Relevant Orders   Ambulatory referral to Urology      Meds ordered this encounter  Medications  . montelukast (SINGULAIR) 10 MG tablet    Sig: Take 1 tablet (10 mg total) by mouth at bedtime.    Dispense:  90 tablet    Refill:  3  . fluticasone (FLONASE) 50 MCG/ACT nasal spray    Sig: Place 2 sprays into both nostrils daily. Dispense 3 bottles.    Dispense:  48 g    Refill:  3  . Zoster Vac Recomb Adjuvanted [facility](SHINGRIX) injection    Sig: Inject 0.5 mLs into the muscle every 6 (six) months. For series of 2 injections total    Dispense:  0.5 mL    Refill:  1  . sildenafil (REVATIO) 20 MG tablet    Sig: Take 1-5 pills about 30 min prior to sex. Start with 1 and increase as needed.    Dispense:  20 tablet    Refill:  1      Follow up plan: Return in about 8 months (around 06/28/2017) for Annual Physical.  Saralyn PilarAlexander Aqsa Sensabaugh, DO [facility]  [facility] 10/26/2016, 2:50 PM

## 2016-10-26 NOTE — Assessment & Plan Note (Signed)
Refill Singulair, Flonase

## 2016-10-26 NOTE — Assessment & Plan Note (Signed)
Stable Remains on BB and Flecainide for ectopy Followed by Cardiology

## 2016-10-26 NOTE — Assessment & Plan Note (Signed)
Followed by Cardiology 

## 2016-10-26 NOTE — Patient Instructions (Addendum)
Thank you for coming to the clinic today.  1.  Check with insurance / pharmacy about coverage and cost of Shingrix (2 series vaccine) more effective than Zostavax.  Printed rx for Shingrix  2.  Referral sent to Urology, should be called with apt within 2 weeks, call them if you don't hear back  They can discuss Erectile Function, and Prostate Screening  [facility] -1st floor [address] [place],  [place]  [id number] Phone: [phone]  Discount generic Sildenafil pills $1 per [facility]   Address: [address], [place], [place] [address] Hours: 8:30AM-6:30PM Phone: [phone]  You will be due for FASTING BLOOD WORK (no food or drink after midnight before, only water or coffee without cream/sugar on the morning of)  - Please go ahead and schedule a "Lab Only" visit in the morning at the clinic for lab draw in 8 months  before next Annual Physical  - Make sure Lab Only appointment is at least 1-2 weeks before your next appointment, so that results will be available  For Lab Results, once available within 2-3 days of blood draw, you can can log in to MyChart online to view your results and a brief explanation. Also, we can discuss results at next follow-up visit.  Please schedule a Follow-up Appointment to: Return in about 8 months (around 06/28/2017) for Annual Physical.  If you have any other questions or concerns, please feel free to call the clinic or send a message through MyChart. You may also schedule an earlier appointment if necessary.  Additionally, you may be receiving a survey about your experience at our clinic within a few days to 1 week by e-mail or mail. We value your feedback.  Saralyn PilarAlexander Mathan Darroch, DO [facility]

## 2016-10-26 NOTE — Assessment & Plan Note (Signed)
Well-controlled Pre-DM with A1c 5.2 (stable to improved from 5.7 to 5.9 down 5.2)  Plan:  1. Not on any therapy currently  2. Encourage improved lifestyle - low carb, low sugar diet, reduce portion size, continue improving regular exercise 3. Follow-up q 6 mo A1c

## 2016-10-27 ENCOUNTER — Other Ambulatory Visit: Payer: Self-pay | Admitting: Family Medicine

## 2016-10-27 DIAGNOSIS — R7303 Prediabetes: Secondary | ICD-10-CM

## 2016-10-27 DIAGNOSIS — Z125 Encounter for screening for malignant neoplasm of prostate: Secondary | ICD-10-CM

## 2016-10-27 DIAGNOSIS — E785 Hyperlipidemia, unspecified: Secondary | ICD-10-CM

## 2016-10-27 DIAGNOSIS — R799 Abnormal finding of blood chemistry, unspecified: Secondary | ICD-10-CM

## 2016-10-27 DIAGNOSIS — I5032 Chronic diastolic (congestive) heart failure: Secondary | ICD-10-CM

## 2016-10-27 DIAGNOSIS — Z0000 Encounter for general adult medical examination without abnormal findings: Secondary | ICD-10-CM

## 2016-10-27 LAB — HEMOGLOBIN A1C
HEMOGLOBIN A1C: 5.2 % (ref <5.7)
MEAN PLASMA GLUCOSE: 103 mg/dL

## 2016-10-27 LAB — HEPATITIS C ANTIBODY: HCV Ab: NEGATIVE

## 2016-11-07 DIAGNOSIS — M65812 Other synovitis and tenosynovitis, left shoulder: Secondary | ICD-10-CM | POA: Diagnosis not present

## 2016-11-07 DIAGNOSIS — M75122 Complete rotator cuff tear or rupture of left shoulder, not specified as traumatic: Secondary | ICD-10-CM | POA: Diagnosis not present

## 2016-11-07 DIAGNOSIS — K219 Gastro-esophageal reflux disease without esophagitis: Secondary | ICD-10-CM | POA: Diagnosis not present

## 2016-11-07 DIAGNOSIS — I10 Essential (primary) hypertension: Secondary | ICD-10-CM | POA: Diagnosis not present

## 2016-11-07 DIAGNOSIS — M25512 Pain in left shoulder: Secondary | ICD-10-CM | POA: Diagnosis not present

## 2016-11-07 DIAGNOSIS — M7542 Impingement syndrome of left shoulder: Secondary | ICD-10-CM | POA: Diagnosis not present

## 2016-11-07 DIAGNOSIS — M19012 Primary osteoarthritis, left shoulder: Secondary | ICD-10-CM | POA: Diagnosis not present

## 2016-11-07 DIAGNOSIS — M24012 Loose body in left shoulder: Secondary | ICD-10-CM | POA: Diagnosis not present

## 2016-11-07 DIAGNOSIS — M7552 Bursitis of left shoulder: Secondary | ICD-10-CM | POA: Diagnosis not present

## 2016-11-07 DIAGNOSIS — Z91018 Allergy to other foods: Secondary | ICD-10-CM | POA: Diagnosis not present

## 2016-11-08 DIAGNOSIS — M7552 Bursitis of left shoulder: Secondary | ICD-10-CM | POA: Diagnosis not present

## 2016-11-08 DIAGNOSIS — I10 Essential (primary) hypertension: Secondary | ICD-10-CM | POA: Diagnosis not present

## 2016-11-08 DIAGNOSIS — M75122 Complete rotator cuff tear or rupture of left shoulder, not specified as traumatic: Secondary | ICD-10-CM | POA: Diagnosis not present

## 2016-11-08 DIAGNOSIS — K219 Gastro-esophageal reflux disease without esophagitis: Secondary | ICD-10-CM | POA: Diagnosis not present

## 2016-11-08 DIAGNOSIS — M24012 Loose body in left shoulder: Secondary | ICD-10-CM | POA: Diagnosis not present

## 2016-11-08 DIAGNOSIS — M7542 Impingement syndrome of left shoulder: Secondary | ICD-10-CM | POA: Diagnosis not present

## 2016-11-08 DIAGNOSIS — Z91018 Allergy to other foods: Secondary | ICD-10-CM | POA: Diagnosis not present

## 2016-11-08 DIAGNOSIS — M19012 Primary osteoarthritis, left shoulder: Secondary | ICD-10-CM | POA: Diagnosis not present

## 2016-11-10 ENCOUNTER — Ambulatory Visit: Payer: 59 | Attending: Orthopaedic Surgery | Admitting: Physical Therapy

## 2016-11-10 DIAGNOSIS — M6281 Muscle weakness (generalized): Secondary | ICD-10-CM | POA: Insufficient documentation

## 2016-11-10 DIAGNOSIS — M25512 Pain in left shoulder: Secondary | ICD-10-CM | POA: Insufficient documentation

## 2016-11-10 DIAGNOSIS — G8929 Other chronic pain: Secondary | ICD-10-CM | POA: Diagnosis not present

## 2016-11-13 ENCOUNTER — Ambulatory Visit: Payer: 59 | Admitting: Physical Therapy

## 2016-11-13 DIAGNOSIS — M6281 Muscle weakness (generalized): Secondary | ICD-10-CM

## 2016-11-13 DIAGNOSIS — M25512 Pain in left shoulder: Secondary | ICD-10-CM | POA: Diagnosis not present

## 2016-11-13 DIAGNOSIS — G8929 Other chronic pain: Secondary | ICD-10-CM

## 2016-11-13 NOTE — Therapy (Signed)
[facility] [address] [place], [address] Phone: [phone]   Fax:  [phone]  Physical Therapy Treatment  Patient Details  Name: Austin Stewart MRN: [id number] Date of Birth: 04-16-1956 Referring Provider: Dr. Austin Miles  Encounter Date: 11/13/2016      PT End of Session - 11/13/16 1658    Visit Number 2   Number of Visits 25   Date for PT Re-Evaluation 02/19/17   PT Start Time 1611   PT Stop Time 1641   PT Time Calculation (min) 30 min   Equipment Utilized During Treatment --  Shoulder sling   Activity Tolerance Patient tolerated treatment well   Behavior During Therapy [facility] for tasks assessed/performed      Past Medical History:  Diagnosis Date  . Allergic rhinitis, cause unspecified   . Allergy   . Cardiac dysrhythmia, unspecified   . Diverticulosis of colon (without mention of hemorrhage)   . Dysrhythmia   . Esophageal reflux   . Measles without mention of complication   . Mumps without mention of complication   . Osteoarthrosis, unspecified whether generalized or localized, unspecified site   . 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 Unspecified hyperplasia of prostate without urinary obstruction and other lower urinary tract symptoms (LUTS)     Past Surgical History:  Procedure Laterality Date  . CARDIAC CATHETERIZATION  2006   [facility]   . HERNIA REPAIR    . MITRAL VALVE REPAIR    . rotator cuff repair    . SHOULDER ARTHROSCOPY Left 08/24/2016   Procedure: ARTHROSCOPY SHOULDER with debridement;  Surgeon: Juanell Fairly, MD;  Location: [facility] ORS;  Service: Orthopedics;  Laterality: Left;  . TONSILLECTOMY      There were no vitals filed for this visit.      Subjective Assessment - 11/13/16 1615    Subjective Patient reports he has been stretching 2x per day, but has been weaning off his medication and has done well with pain control.  They have been working on stretching to the point of "stretch" sensation but not pain as instructed.    Patient is accompained by: Family member   Pertinent History Previously failed rotator cuff repair from years ago, biceps tenodesis performed years ago in that procedure.    Limitations Lifting;House hold activities   Diagnostic tests Intra-operative findings indicative of split cuff tendon and full tear of infraspinatus and supraspinatus   Patient Stated Goals To return to full work duties.    Currently in Pain? No/denies      Initially 27 degrees ER, up to 35 by the end of the session with multiple bouts of gentle oscillations at end range of pain free range of motion.   Up to 60-65 degrees of passive flexion on 3 bouts of passive low load long duration stretching with 3-4 points of contact and cuing to keep his LUE "heavy" throughout bouts.   2 sets of scapular retractions/holds x 10 repetitions  Inspected wound and incision area, no streaking noted. Bruising in biceps area as well as swelling readily evident. Sensation in hand is appropriate. Bruising noted around L upper trapezius as well.                            PT Education - 11/13/16 1658    Education provided Yes   Education Details Will likely keep therapy  sessions to 30 minutes in the first few weeks given his precautions.    Person(s) Educated Patient;Spouse   Methods Explanation;Demonstration   Comprehension Verbalized understanding;Returned demonstration             PT Long Term Goals - 11/13/16 0820      PT LONG TERM GOAL #1   Title Patient will demonstrate at least 150 degrees of active shoulder flexion to perform overhead activities.    Baseline Unable - precautions    Time 16   Period Weeks   Status New     PT LONG TERM GOAL #2   Title Patient will be able to perform light lifting duties (10# or less) at work to complete work related duties with no increase in pain.    Baseline  Unable - precautions    Time 16   Period Weeks   Status New     PT LONG TERM GOAL #3   Title Patient will report worst pain of no more than 2/10 in LUE to demonstrate improved tolerance for work related activities.    Baseline No pain currently, but multi-modal use of pain medications/interventions.    Time 16   Period Weeks   Status New     PT LONG TERM GOAL #4   Title Patient will report Quick Dash score of less than 25% to demonstrate improved tolerance for ADLs.    Baseline Did not complete due to immobilization.    Time 16   Period Weeks   Status New               Plan - 11/13/16 1658    Clinical Impression Statement Patient has had good pain control and improved tolerance for passive ROM into ER and flexion this date. He has maintained precautions and reports no pain currently while weaning off pain medications, all appropriate for this stage of rehab.    Clinical Presentation Stable   Clinical Decision Making High   Clinical Impairments Affecting Rehab Potential Significant repair, 3rd arthroscopic shoulder sx    PT Frequency 2x / week   PT Duration 12 weeks   PT Treatment/Interventions Aquatic Therapy;Moist Heat;Iontophoresis 4mg /ml Dexamethasone;Cryotherapy;Electrical Stimulation;Therapeutic exercise;Therapeutic activities;Neuromuscular re-education;Taping;Manual techniques;Dry needling;Biofeedback;Passive range of motion   PT Next Visit Plan Progress ROM and manual interventions per protocol    PT Home Exercise Plan Passive ER within designated guidelines, same for PROM flexion.    Consulted and Agree with Plan of Care Patient;Family member/caregiver   Family Member Consulted Spouse       Patient will benefit from skilled therapeutic intervention in order to improve the following deficits and impairments:  Pain, Improper body mechanics, Decreased range of motion, Decreased strength, Impaired UE functional use, Decreased activity tolerance, Decreased knowledge of  precautions  Visit Diagnosis: Muscle weakness (generalized)  Chronic left shoulder pain     Problem List Patient Active Problem List   Diagnosis Date Noted  . Trochanteric bursitis, left hip 02/01/2016  . Pre-diabetes 08/05/2015  . Seasonal allergies 08/05/2015  . Elevated lipids 08/05/2015  . Chest tightness 06/11/2015  . Cutaneous skin tags 01/05/2015  . GERD without esophagitis 12/07/2014  . BPH (benign prostatic hyperplasia) 12/07/2014  . Diastolic CHF (HCC) 06/09/2014  . Shortness of breath 06/09/2013  . H/O mitral valve repair 06/03/2012  . Heart palpitations 06/03/2012  . Dizziness 06/03/2012   Alva GarnetPatrick Jenaye Rickert PT, DPT, CSCS    11/13/2016, 5:00 PM  [facility] [address]  [place], [place], [address] Phone: [phone]   Fax:  [phone]  Name: Austin Stewart MRN: [id number] Date of Birth: 16-Jan-1956

## 2016-11-13 NOTE — Patient Instructions (Signed)
Initially 27 degrees ER, up to 35 by the end of the session with multiple bouts of gentle oscillations at end range of pain free range of motion.   Up to 60-65 degrees of passive flexion on 3 bouts of passive low load long duration stretching with 3-4 points of contact and cuing to keep his LUE "heavy" throughout bouts.   2 sets of scapular retractions/holds x 10 repetitions  Inspected wound and incision area, no streaking noted. Bruising in biceps area as well as swelling readily evident. Sensation in hand is appropriate. Bruising noted around L upper trapezius as well.

## 2016-11-13 NOTE — Therapy (Signed)
[place]  [facility] [address] [place], [address] Phone: [phone]   Fax:  [phone]  Physical Therapy Evaluation  Patient Details  Name: Austin Stewart MRN: [id number] Date of Birth: 10/09/1955 Referring Provider: Dr. Austin Miles  Encounter Date: 11/10/2016      PT End of Session - 11/13/16 0818    Visit Number 1   Number of Visits 25   Date for PT Re-Evaluation 02/19/17   PT Start Time 0935   PT Stop Time 1030   PT Time Calculation (min) 55 min   Equipment Utilized During Treatment --  Shoulder sling   Activity Tolerance Patient tolerated treatment well   Behavior During Therapy [facility] for tasks assessed/performed      Past Medical History:  Diagnosis Date  . Allergic rhinitis, cause unspecified   . Allergy   . Cardiac dysrhythmia, unspecified   . Diverticulosis of colon (without mention of hemorrhage)   . Dysrhythmia   . Esophageal reflux   . Measles without mention of complication   . Mumps without mention of complication   . Osteoarthrosis, unspecified whether generalized or localized, unspecified site   . 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 Unspecified hyperplasia of prostate without urinary obstruction and other lower urinary tract symptoms (LUTS)     Past Surgical History:  Procedure Laterality Date  . CARDIAC CATHETERIZATION  2006   [facility]   . HERNIA REPAIR    . MITRAL VALVE REPAIR    . rotator cuff repair    . SHOULDER ARTHROSCOPY Left 08/24/2016   Procedure: ARTHROSCOPY SHOULDER with debridement;  Surgeon: Juanell Fairly, MD;  Location: [facility] ORS;  Service: Orthopedics;  Laterality: Left;  . TONSILLECTOMY      There were no vitals filed for this visit.       Subjective Assessment - 11/13/16 0810    Subjective Patient reports he had a set back and began having shoulder pain again after first bout of therapy and elected to undergo  massive L shoulder rotator cuff repair/reconstruction on 7/10. He has been compliant with sling and abduction pillow at all times but does admit to holding a coffee cup in L hand post-operatively. He reports his pain has been well managed with oral pain medications, TENS unit, and continuous nerve block.    Patient is accompained by: Family member   Pertinent History Previously failed rotator cuff repair from years ago, biceps tenodesis performed years ago in that procedure.    Limitations Lifting;House hold activities   Diagnostic tests Intra-operative findings indicative of split cuff tendon and full tear of infraspinatus and supraspinatus   Patient Stated Goals To return to full work duties.    Currently in Pain? No/denies            [facility] PT Assessment - 11/13/16 7253      Assessment   Medical Diagnosis Massive Rotator Cuff Reconstruction - Left   Referring Provider Dr. Austin Miles   Onset Date/Surgical Date 11/07/16   Prior Therapy --  PT with this therapist     Precautions   Precautions Shoulder   Type of Shoulder Precautions --  Massive Repair guidelines provided by MD   Shoulder Interventions Shoulder sling/immobilizer;Shoulder abduction pillow;At all times;Off for dressing/bathing/exercises   Precaution Booklet Issued Yes (comment)     Restrictions   Weight Bearing Restrictions Yes   LUE Weight Bearing Non weight bearing   Other Position/Activity  Restrictions --  See protocol provided by MD     Balance Screen   Has the patient fallen in the past 6 months No     Home Environment   Living Environment Private residence   Available Help at Discharge Family     Prior Function   Level of Independence Independent   Vocation Full time employment   Vocation Requirements Light lifting, walking around facility     Cognition   Overall Cognitive Status Within Functional Limits for tasks assessed     Observation/Other Assessments-Edema    Edema --  None noted at this  stage of rehabilitation     Sensation   Light Touch Appears Intact     AROM   Overall AROM Comments --  Full motion of elbow, wrist, hand noted (no tenodesis sx)     PROM   Right/Left Shoulder Left   Left Shoulder Flexion --  Able to achieve 70-80 degrees before mild pain/discomfort   Left Shoulder External Rotation --  20 degrees in scapular plane     There-Ex  Verbally educated, followed by instruction while patients spouse performed both passive flexion and external rotation at neutral in scapular plane through pain free ranges of motion. Instructed patient to inform wife when discomfort/pain onset and to stay below these ranges. PT performed prolonged passive motion bouts educating patient to continue to relax musculature to reduce tensile load of reconstructed tissue, performed very slow movements throughout with 3-4 points of contact between therapist and patient to reduce guarding.   Educated and instructed patient on completion of pendulums with elbow flexed to 90 degrees and cradled by contralateral UE, which he was able to perform appropriately.   Observed scapular retractions to neutral with sling and abduction pillow donned x 10 (no pain reported)   Discussed at length appropriate position for sling, technique for donning/doffing and need to reduce tensile strain on reconstructed tissue at this time.        Objective measurements completed on examination: See above findings.                  PT Education - 11/13/16 0817    Education provided Yes   Education Details Went over his protocol instructions, expected time frame(s) for sling use and recovery as well as instructed wife on passive motion and limits for this phase.    Person(s) Educated Patient;Spouse   Methods Demonstration;Explanation;Handout;Tactile cues;Verbal cues   Comprehension Verbalized understanding;Returned demonstration;Verbal cues required;Tactile cues required             PT  Long Term Goals - 11/13/16 0820      PT LONG TERM GOAL #1   Title Patient will demonstrate at least 150 degrees of active shoulder flexion to perform overhead activities.    Baseline Unable - precautions    Time 16   Period Weeks   Status New     PT LONG TERM GOAL #2   Title Patient will be able to perform light lifting duties (10# or less) at work to complete work related duties with no increase in pain.    Baseline Unable - precautions    Time 16   Period Weeks   Status New     PT LONG TERM GOAL #3   Title Patient will report worst pain of no more than 2/10 in LUE to demonstrate improved tolerance for work related activities.    Baseline No pain currently, but multi-modal use of pain medications/interventions.    Time  16   Period Weeks   Status New     PT LONG TERM GOAL #4   Title Patient will report Quick Dash score of less than 25% to demonstrate improved tolerance for ADLs.    Baseline Did not complete due to immobilization.    Time 16   Period Weeks   Status New                Plan - 11/13/16 0819    Clinical Impression Statement Patient presents after failing conservative treatment and electing for massive rotator cuff repair. He has previously had rotator cuff repair, which failed and underwent surgery on 7/10 including L subacromial decompression, glenohumeral debridement, open reconstruction of subscapularis and superior capsule, bursectomy, and re-attachment of supraspinatus tendon. He has continuous nerve block, TENS, abduction pillow and sling, as well as oral pain medication which has controlled his pain at rest well. He requires significant verbal cuing for relaxation of local musculature, but is able to advance through most of the designated ranges of passive motion currently. His wife was present and educated both verbally and with PT observing and cuing for passive motion to be completed as part of HEP. Patient informed of absolutely no lifting with his  hand, to be in sling at all times except PROM, and need to continue with adequate pain control. Patient underwent an extensive reconstructive surgery and will require lengthy bout of rehabilitation to maximize functional use of LUE, while protecting the reconstruction. He will certainly benefit from skilled rehabilitation.    Clinical Presentation Evolving   Clinical Decision Making High   Clinical Impairments Affecting Rehab Potential Significant repair, 3rd arthroscopic shoulder sx    PT Frequency 2x / week   PT Duration 12 weeks   PT Treatment/Interventions Aquatic Therapy;Moist Heat;Iontophoresis 4mg /ml Dexamethasone;Cryotherapy;Electrical Stimulation;Therapeutic exercise;Therapeutic activities;Neuromuscular re-education;Taping;Manual techniques;Dry needling;Biofeedback;Passive range of motion   PT Next Visit Plan Progress ROM and manual interventions per protocol    PT Home Exercise Plan Passive ER within designated guidelines, same for PROM flexion.    Consulted and Agree with Plan of Care Patient;Family member/caregiver   Family Member Consulted Spouse       Patient will benefit from skilled therapeutic intervention in order to improve the following deficits and impairments:  Pain, Improper body mechanics, Decreased range of motion, Decreased strength, Impaired UE functional use, Decreased activity tolerance, Decreased knowledge of precautions  Visit Diagnosis: Chronic left shoulder pain - Plan: PT plan of care cert/re-cert  Muscle weakness (generalized) - Plan: PT plan of care cert/re-cert     Problem List Patient Active Problem List   Diagnosis Date Noted  . Trochanteric bursitis, left hip 02/01/2016  . Pre-diabetes 08/05/2015  . Seasonal allergies 08/05/2015  . Elevated lipids 08/05/2015  . Chest tightness 06/11/2015  . Cutaneous skin tags 01/05/2015  . GERD without esophagitis 12/07/2014  . BPH (benign prostatic hyperplasia) 12/07/2014  . Diastolic CHF (HCC) 06/09/2014   . Shortness of breath 06/09/2013  . H/O mitral valve repair 06/03/2012  . Heart palpitations 06/03/2012  . Dizziness 06/03/2012   Alva Garnet PT, DPT, CSCS    11/13/2016, 8:49 AM  [place] [facility] [place] [facility] [place] PHYSICAL AND SPORTS MEDICINE [address], [place], [address] Phone: [phone]   Fax:  [phone]  Name: HOWELL GROESBECK MRN: [id number] Date of Birth: 1956/03/14

## 2016-11-16 ENCOUNTER — Ambulatory Visit: Payer: 59 | Admitting: Physical Therapy

## 2016-11-16 DIAGNOSIS — M6281 Muscle weakness (generalized): Secondary | ICD-10-CM | POA: Diagnosis not present

## 2016-11-16 DIAGNOSIS — M25512 Pain in left shoulder: Secondary | ICD-10-CM

## 2016-11-16 DIAGNOSIS — G8929 Other chronic pain: Secondary | ICD-10-CM | POA: Diagnosis not present

## 2016-11-16 NOTE — Therapy (Signed)
[place] [facility] [address], [place], [address] Phone: [phone]   Fax:  [phone]  Physical Therapy Treatment  Patient Details  Name: Austin Stewart MRN: [id number] Date of Birth: [birth year] Referring Provider: Dr. Austin Miles  Encounter Date: 11/16/2016      PT End of Session - 11/16/16 1725    Visit Number 3   Number of Visits 25   Date for PT Re-Evaluation 02/19/17   PT Start Time 1637   PT Stop Time 1710   PT Time Calculation (min) 33 min   Equipment Utilized During Treatment --  Shoulder sling   Activity Tolerance Patient tolerated treatment well   Behavior During Therapy [facility] for tasks assessed/performed      Past Medical History:  Diagnosis Date  . Allergic rhinitis, cause unspecified   . Allergy   . Cardiac dysrhythmia, unspecified   . Diverticulosis of colon (without mention of hemorrhage)   . Dysrhythmia   . Esophageal reflux   . Measles without mention of complication   . Mumps without mention of complication   . Osteoarthrosis, unspecified whether generalized or localized, unspecified site   . 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 Unspecified hyperplasia of prostate without urinary obstruction and other lower urinary tract symptoms (LUTS)     Past Surgical History:  Procedure Laterality Date  . CARDIAC CATHETERIZATION  2006   [facility]   . HERNIA REPAIR    . MITRAL VALVE REPAIR    . rotator cuff repair    . SHOULDER ARTHROSCOPY Left 08/24/2016   Procedure: ARTHROSCOPY SHOULDER with debridement;  Surgeon: Juanell Fairly, MD;  Location: [facility] ORS;  Service: Orthopedics;  Laterality: Left;  . TONSILLECTOMY      There were no vitals filed for this visit.      Subjective Assessment - 11/16/16 1639    Subjective Patient reports he has been able to d/c TENS unit. Patient denies pain and has been compliant with HEP.    Patient is  accompained by: Family member   Pertinent History Previously failed rotator cuff repair from years ago, biceps tenodesis performed years ago in that procedure.    Limitations Lifting;House hold activities   Diagnostic tests Intra-operative findings indicative of split cuff tendon and full tear of infraspinatus and supraspinatus   Patient Stated Goals To return to full work duties.    Currently in Pain? No/denies     PROM -- ER x 6 bouts total x 30-45" in each bout to tolerance to roughly 30 degrees in each bout -- patient had mild increase in pain which decreased once tension removed.   PROM Flexion through roughly 65 degrees to tolerance with prolonged time spent in elevation in each bout x 5 bouts -- reported no pain with completion.   Assessed soft tissue, mild discomfort in posterior cuff musculature near incision site which reduced with soft tissue mobilization applied.                              PT Education - 11/16/16 1653    Education provided Yes   Education Details Continue to monitor pain and make sure to keep as little as possible.    Person(s) Educated Patient;Spouse   Methods Explanation;Demonstration   Comprehension Returned demonstration;Verbalized understanding             PT Long Term Goals -  11/13/16 0820      PT LONG TERM GOAL #1   Title Patient will demonstrate at least 150 degrees of active shoulder flexion to perform overhead activities.    Baseline Unable - precautions    Time 16   Period Weeks   Status New     PT LONG TERM GOAL #2   Title Patient will be able to perform light lifting duties (10# or less) at work to complete work related duties with no increase in pain.    Baseline Unable - precautions    Time 16   Period Weeks   Status New     PT LONG TERM GOAL #3   Title Patient will report worst pain of no more than 2/10 in LUE to demonstrate improved tolerance for work related activities.    Baseline No pain  currently, but multi-modal use of pain medications/interventions.    Time 16   Period Weeks   Status New     PT LONG TERM GOAL #4   Title Patient will report Quick Dash score of less than 25% to demonstrate improved tolerance for ADLs.    Baseline Did not complete due to immobilization.    Time 16   Period Weeks   Status New               Plan - 11/16/16 1726    Clinical Impression Statement Patient continues to have good pain control with improving PROM into both flexion and ER in this session. He continues to maintain precautions at home and has had no pain without TENS.    Clinical Presentation Stable   Clinical Decision Making Moderate   Clinical Impairments Affecting Rehab Potential Significant repair, 3rd arthroscopic shoulder sx    PT Frequency 2x / week   PT Duration 12 weeks   PT Treatment/Interventions Aquatic Therapy;Moist Heat;Iontophoresis 4mg /ml Dexamethasone;Cryotherapy;Electrical Stimulation;Therapeutic exercise;Therapeutic activities;Neuromuscular re-education;Taping;Manual techniques;Dry needling;Biofeedback;Passive range of motion   PT Next Visit Plan Progress ROM and manual interventions per protocol    PT Home Exercise Plan Passive ER within designated guidelines, same for PROM flexion.    Consulted and Agree with Plan of Care Patient;Family member/caregiver   Family Member Consulted Spouse       Patient will benefit from skilled therapeutic intervention in order to improve the following deficits and impairments:  Pain, Improper body mechanics, Decreased range of motion, Decreased strength, Impaired UE functional use, Decreased activity tolerance, Decreased knowledge of precautions  Visit Diagnosis: Muscle weakness (generalized)  Chronic left shoulder pain     Problem List Patient Active Problem List   Diagnosis Date Noted  . Trochanteric bursitis, left hip 02/01/2016  . Pre-diabetes 08/05/2015  . Seasonal allergies 08/05/2015  . Elevated  lipids 08/05/2015  . Chest tightness 06/11/2015  . Cutaneous skin tags 01/05/2015  . GERD without esophagitis 12/07/2014  . BPH (benign prostatic hyperplasia) 12/07/2014  . Diastolic CHF (HCC) 06/09/2014  . Shortness of breath 06/09/2013  . H/O mitral valve repair 06/03/2012  . Heart palpitations 06/03/2012  . Dizziness 06/03/2012   Alva GarnetPatrick Menna Abeln PT, DPT, CSCS    11/16/2016, 5:30 PM   [facility] [address] [place], [place], [address] Phone: [phone]   Fax:  [phone]  Name: Austin Stewart MRN: [id number] Date of Birth: 02/07/1956

## 2016-11-20 ENCOUNTER — Encounter: Payer: 59 | Admitting: Physical Therapy

## 2016-11-21 ENCOUNTER — Ambulatory Visit: Payer: 59

## 2016-11-21 ENCOUNTER — Ambulatory Visit: Payer: 59 | Admitting: Physical Therapy

## 2016-11-21 DIAGNOSIS — M6281 Muscle weakness (generalized): Secondary | ICD-10-CM | POA: Diagnosis not present

## 2016-11-21 DIAGNOSIS — M25512 Pain in left shoulder: Secondary | ICD-10-CM | POA: Diagnosis not present

## 2016-11-21 DIAGNOSIS — G8929 Other chronic pain: Secondary | ICD-10-CM | POA: Diagnosis not present

## 2016-11-21 NOTE — Patient Instructions (Signed)
PROM ER   PROM Flexion    Observed and cued pendulums with elbow flexed and supported by contralateral UE.

## 2016-11-21 NOTE — Therapy (Signed)
[place] [facility] [address] [place], [place], [address] Phone: [phone]   Fax:  [phone]  Physical Therapy Treatment  Patient Details  Name: Austin Stewart MRN: [id number] Date of Birth: 04/20/1956 Referring Provider: Dr. Austin MilesAllison Toth  Encounter Date: 11/21/2016      PT End of Session - 11/21/16 1508    Visit Number 4   Number of Visits 25   Date for PT Re-Evaluation 02/19/17   PT Start Time 1433   PT Stop Time 1503   PT Time Calculation (min) 30 min   Equipment Utilized During Treatment --  Shoulder sling   Activity Tolerance Patient tolerated treatment well   Behavior During Therapy [facility] for tasks assessed/performed      Past Medical History:  Diagnosis Date  . Allergic rhinitis, cause unspecified   . Allergy   . Cardiac dysrhythmia, unspecified   . Diverticulosis of colon (without mention of hemorrhage)   . Dysrhythmia   . Esophageal reflux   . Measles without mention of complication   . Mumps without mention of complication   . Osteoarthrosis, unspecified whether generalized or localized, unspecified site   . 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 Unspecified hyperplasia of prostate without urinary obstruction and other lower urinary tract symptoms (LUTS)     Past Surgical History:  Procedure Laterality Date  . CARDIAC CATHETERIZATION  2006   [facility]   . HERNIA REPAIR    . MITRAL VALVE REPAIR    . rotator cuff repair    . SHOULDER ARTHROSCOPY Left 08/24/2016   Procedure: ARTHROSCOPY SHOULDER with debridement;  Surgeon: Juanell FairlyKevin Krasinski, MD;  Location: [facility] ORS;  Service: Orthopedics;  Laterality: Left;  . TONSILLECTOMY      There were no vitals filed for this visit.      Subjective Assessment - 11/21/16 1440    Subjective Patient reports he has had some stiffness in his R upper trap area, otherwise no pain in the joint. He has been sleeping ok, but  he wakes up frequently when he rolls over due to the pillow not pain.    Patient is accompained by: Family member   Pertinent History Previously failed rotator cuff repair from years ago, biceps tenodesis performed years ago in that procedure.    Limitations Lifting;House hold activities   Diagnostic tests Intra-operative findings indicative of split cuff tendon and full tear of infraspinatus and supraspinatus   Patient Stated Goals To return to full work duties.    Currently in Pain? No/denies        PROM ER to roughly 30-35 degrees x 5 bouts x 45-60" with minimal oscillations at end range with beginning of soft tissue end feel appreciated  PROM Flexion to roughly 70-80 degrees x 5 bouts x 45-60" with slow oscillations throughout range, beginning to feel soft tissue end feel, though no increase in pain just "stretching" reported by patient.    Observed and cued pendulums with elbow flexed and supported by contralateral UE. X 10, cuing for increased oscillation from one LE to the other as driver of movement.                           PT Education - 11/21/16 1507    Education provided Yes   Education Details He has done well with pain control and ROM goals thus far.    Person(s) Educated  Patient;Spouse   Methods Explanation;Demonstration   Comprehension Verbalized understanding;Returned demonstration             PT Long Term Goals - 11/13/16 0820      PT LONG TERM GOAL #1   Title Patient will demonstrate at least 150 degrees of active shoulder flexion to perform overhead activities.    Baseline Unable - precautions    Time 16   Period Weeks   Status New     PT LONG TERM GOAL #2   Title Patient will be able to perform light lifting duties (10# or less) at work to complete work related duties with no increase in pain.    Baseline Unable - precautions    Time 16   Period Weeks   Status New     PT LONG TERM GOAL #3   Title Patient will report worst  pain of no more than 2/10 in LUE to demonstrate improved tolerance for work related activities.    Baseline No pain currently, but multi-modal use of pain medications/interventions.    Time 16   Period Weeks   Status New     PT LONG TERM GOAL #4   Title Patient will report Quick Dash score of less than 25% to demonstrate improved tolerance for ADLs.    Baseline Did not complete due to immobilization.    Time 16   Period Weeks   Status New               Plan - 11/21/16 1509    Clinical Impression Statement Patient has had excellent pain control and has steadily improved tolerance for passive ROM into both ER and flexion. He is progressing well towards established goals for this phase (ROM and pain management with safety of repair).    Clinical Presentation Stable   Clinical Decision Making Moderate   Clinical Impairments Affecting Rehab Potential Significant repair, 3rd arthroscopic shoulder sx    PT Frequency 2x / week   PT Duration 12 weeks   PT Treatment/Interventions Aquatic Therapy;Moist Heat;Iontophoresis 4mg /ml Dexamethasone;Cryotherapy;Electrical Stimulation;Therapeutic exercise;Therapeutic activities;Neuromuscular re-education;Taping;Manual techniques;Dry needling;Biofeedback;Passive range of motion   PT Next Visit Plan Progress ROM and manual interventions per protocol    PT Home Exercise Plan Passive ER within designated guidelines, same for PROM flexion.    Consulted and Agree with Plan of Care Patient;Family member/caregiver   Family Member Consulted Spouse       Patient will benefit from skilled therapeutic intervention in order to improve the following deficits and impairments:  Pain, Improper body mechanics, Decreased range of motion, Decreased strength, Impaired UE functional use, Decreased activity tolerance, Decreased knowledge of precautions  Visit Diagnosis: Muscle weakness (generalized)  Chronic left shoulder pain     Problem List Patient Active  Problem List   Diagnosis Date Noted  . Trochanteric bursitis, left hip 02/01/2016  . Pre-diabetes 08/05/2015  . Seasonal allergies 08/05/2015  . Elevated lipids 08/05/2015  . Chest tightness 06/11/2015  . Cutaneous skin tags 01/05/2015  . GERD without esophagitis 12/07/2014  . BPH (benign prostatic hyperplasia) 12/07/2014  . Diastolic CHF (HCC) 06/09/2014  . Shortness of breath 06/09/2013  . H/O mitral valve repair 06/03/2012  . Heart palpitations 06/03/2012  . Dizziness 06/03/2012   Alva Garnet PT, DPT, CSCS    11/21/2016, 3:10 PM  [facility] PHYSICAL AND SPORTS MEDICINE [address], [place], [address] Phone: [phone]   Fax:  [phone]  Name: Austin Stewart MRN: [id number] Date of  Birth: Apr 09, 1956

## 2016-11-23 ENCOUNTER — Ambulatory Visit: Payer: 59 | Admitting: Physical Therapy

## 2016-11-23 DIAGNOSIS — M25512 Pain in left shoulder: Secondary | ICD-10-CM

## 2016-11-23 DIAGNOSIS — M6281 Muscle weakness (generalized): Secondary | ICD-10-CM

## 2016-11-23 DIAGNOSIS — G8929 Other chronic pain: Secondary | ICD-10-CM

## 2016-11-23 NOTE — Patient Instructions (Signed)
PROM ER  PROM forward elevation in supine

## 2016-11-27 ENCOUNTER — Encounter: Payer: 59 | Admitting: Physical Therapy

## 2016-11-27 NOTE — Therapy (Signed)
[place] [facility] [address] [place], [address] Phone: [phone]   Fax:  [phone]  Physical Therapy Treatment  Patient Details  Name: Austin Stewart MRN: [id number] Date of Birth: Mar 25, 1956 Referring Provider: Dr. Ricardo Jericho  Encounter Date: 11/23/2016      PT End of Session - 11/27/16 0745    Visit Number 5   Number of Visits 25   Date for PT Re-Evaluation 02/19/17   PT Start Time 1600   PT Stop Time 1630   PT Time Calculation (min) 30 min   Equipment Utilized During Treatment --  Shoulder sling   Activity Tolerance Patient tolerated treatment well   Behavior During Therapy [facility] for tasks assessed/performed      Past Medical History:  Diagnosis Date  . Allergic rhinitis, cause unspecified   . Allergy   . Cardiac dysrhythmia, unspecified   . Diverticulosis of colon (without mention of hemorrhage)   . Dysrhythmia   . Esophageal reflux   . Measles without mention of complication   . Mumps without mention of complication   . Osteoarthrosis, unspecified whether generalized or localized, unspecified site   . 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 Unspecified hyperplasia of prostate without urinary obstruction and other lower urinary tract symptoms (LUTS)     Past Surgical History:  Procedure Laterality Date  . CARDIAC CATHETERIZATION  2006   [facility]   . HERNIA REPAIR    . MITRAL VALVE REPAIR    . rotator cuff repair    . SHOULDER ARTHROSCOPY Left 08/24/2016   Procedure: ARTHROSCOPY SHOULDER with debridement;  Surgeon: Thornton Park, MD;  Location: [facility] ORS;  Service: Orthopedics;  Laterality: Left;  . TONSILLECTOMY      There were no vitals filed for this visit.      Subjective Assessment - 11/27/16 0744    Subjective Patient reports he went in to see the PA and got his stitches removed, they only did ER PROM and were happy with his progress  thus far. He reports he gets some muscle soreness from stretching, but otherwise no pain to very little pain.    Patient is accompained by: Family member   Pertinent History Previously failed rotator cuff repair from years ago, biceps tenodesis performed years ago in that procedure.    Limitations Lifting;House hold activities   Diagnostic tests Intra-operative findings indicative of split cuff tendon and full tear of infraspinatus and supraspinatus   Patient Stated Goals To return to full work duties.    Currently in Pain? No/denies         PROM ER x 8 bouts x 15-45" at roughly 20 degrees of abduction to tolerance in ROM (measured at roughly 30-35 degrees) with increase in stretching sensation but no discomfort overtly noted.   PROM forward elevation in supine x 8 bouts x 30-45" per bout through roughly 80 degrees of overhead motion to a point where patient reported feeling tightness but not discomfort. Patient tolerated well with increased ROM noted from previous visits, at roughly 80 degrees, therapist began to feel firm (soft tissue) end feel.                          PT Education - 11/27/16 0744    Education provided Yes   Education Details Continue with pain control and gentle stretching, good progress thus far.    Person(s)  Educated Patient;Spouse   Methods Explanation;Demonstration   Comprehension Verbalized understanding;Returned demonstration             PT Long Term Goals - 11/13/16 0820      PT LONG TERM GOAL #1   Title Patient will demonstrate at least 150 degrees of active shoulder flexion to perform overhead activities.    Baseline Unable - precautions    Time 16   Period Weeks   Status New     PT LONG TERM GOAL #2   Title Patient will be able to perform light lifting duties (10# or less) at work to complete work related duties with no increase in pain.    Baseline Unable - precautions    Time 16   Period Weeks   Status New     PT LONG  TERM GOAL #3   Title Patient will report worst pain of no more than 2/10 in LUE to demonstrate improved tolerance for work related activities.    Baseline No pain currently, but multi-modal use of pain medications/interventions.    Time 16   Period Weeks   Status New     PT LONG TERM GOAL #4   Title Patient will report Quick Dash score of less than 25% to demonstrate improved tolerance for ADLs.    Baseline Did not complete due to immobilization.    Time 16   Period Weeks   Status New               Plan - 11/27/16 0745    Clinical Impression Statement Patient had positive check up with PA, he continues to have good pain control with discomfort noted only at the end range of flexion and ER passively as dictated by surgical repair. He is progressing well and has nearly met his PROM goals at this time.    Clinical Presentation Stable   Clinical Decision Making Moderate   Clinical Impairments Affecting Rehab Potential Significant repair, 3rd arthroscopic shoulder sx    PT Frequency 2x / week   PT Duration 12 weeks   PT Treatment/Interventions Aquatic Therapy;Moist Heat;Iontophoresis 56m/ml Dexamethasone;Cryotherapy;Electrical Stimulation;Therapeutic exercise;Therapeutic activities;Neuromuscular re-education;Taping;Manual techniques;Dry needling;Biofeedback;Passive range of motion   PT Next Visit Plan Progress ROM and manual interventions per protocol    PT Home Exercise Plan Passive ER within designated guidelines, same for PROM flexion.    Consulted and Agree with Plan of Care Patient;Family member/caregiver   Family Member Consulted Spouse       Patient will benefit from skilled therapeutic intervention in order to improve the following deficits and impairments:  Pain, Improper body mechanics, Decreased range of motion, Decreased strength, Impaired UE functional use, Decreased activity tolerance, Decreased knowledge of precautions  Visit Diagnosis: Muscle weakness  (generalized)  Chronic left shoulder pain     Problem List Patient Active Problem List   Diagnosis Date Noted  . Trochanteric bursitis, left hip 02/01/2016  . Pre-diabetes 08/05/2015  . Seasonal allergies 08/05/2015  . Elevated lipids 08/05/2015  . Chest tightness 06/11/2015  . Cutaneous skin tags 01/05/2015  . GERD without esophagitis 12/07/2014  . BPH (benign prostatic hyperplasia) 12/07/2014  . Diastolic CHF ([facility] 017/00/1749 . Shortness of breath 06/09/2013  . H/O mitral valve repair 06/03/2012  . Heart palpitations 06/03/2012  . Dizziness 06/03/2012   PRoyce MacadamiaPT, DPT, CSCS    11/27/2016, 7:47 AM  [facility] [address] [place] [address]Phone: [phone]  Fax:  [phone]  Name: Austin Stewart MRN: [id number] Date of Birth: [birth year]

## 2016-11-28 ENCOUNTER — Ambulatory Visit: Payer: 59 | Admitting: Physical Therapy

## 2016-11-28 DIAGNOSIS — M25512 Pain in left shoulder: Secondary | ICD-10-CM | POA: Diagnosis not present

## 2016-11-28 DIAGNOSIS — G8929 Other chronic pain: Secondary | ICD-10-CM

## 2016-11-28 DIAGNOSIS — M6281 Muscle weakness (generalized): Secondary | ICD-10-CM | POA: Diagnosis not present

## 2016-11-28 NOTE — Therapy (Signed)
[place] [facility] [address] [place], [place], [address] Phone: [phone]   Fax:  [phone]  Physical Therapy Treatment  Patient Details  Name: Austin Stewart MRN: [id number] Date of Birth: 10/30/1955 Referring Provider: Dr. Austin MilesAllison Toth  Encounter Date: 11/28/2016      PT End of Session - 11/28/16 1608    Visit Number 6   Number of Visits 25   Date for PT Re-Evaluation 02/19/17   PT Start Time 1525   PT Stop Time 1550   PT Time Calculation (min) 25 min   Equipment Utilized During Treatment --  Shoulder sling   Activity Tolerance Patient tolerated treatment well   Behavior During Therapy [facility] for tasks assessed/performed      Past Medical History:  Diagnosis Date  . Allergic rhinitis, cause unspecified   . Allergy   . Cardiac dysrhythmia, unspecified   . Diverticulosis of colon (without mention of hemorrhage)   . Dysrhythmia   . Esophageal reflux   . Measles without mention of complication   . Mumps without mention of complication   . Osteoarthrosis, unspecified whether generalized or localized, unspecified site   . 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 Unspecified hyperplasia of prostate without urinary obstruction and other lower urinary tract symptoms (LUTS)     Past Surgical History:  Procedure Laterality Date  . CARDIAC CATHETERIZATION  2006   [facility]   . HERNIA REPAIR    . MITRAL VALVE REPAIR    . rotator cuff repair    . SHOULDER ARTHROSCOPY Left 08/24/2016   Procedure: ARTHROSCOPY SHOULDER with debridement;  Surgeon: Juanell FairlyKevin Krasinski, MD;  Location: [facility] ORS;  Service: Orthopedics;  Laterality: Left;  . TONSILLECTOMY      There were no vitals filed for this visit.      Subjective Assessment - 11/28/16 1529    Subjective Patient reports he has been largely pain free, occasional bouts of mild pain in posterior part of shoulder, but overall he is  doing well. Reports some soreness after sessions.    Patient is accompained by: Family member   Pertinent History Previously failed rotator cuff repair from years ago, biceps tenodesis performed years ago in that procedure.    Limitations Lifting;House hold activities   Diagnostic tests Intra-operative findings indicative of split cuff tendon and full tear of infraspinatus and supraspinatus   Patient Stated Goals To return to full work duties.    Currently in Pain? Other (Comment)  Reports very mild soreness around posterior portion of shoulder girdle.       Scapular squeezes x 15 for 3 sets   PROM ER to roughly 40 degrees per protocol can increase to 45 degrees for the next 3 weeks x 10 minutes total with rest breaks provided and notable increase in ROM (reported just stiffness but not pain at end range.)  PROM flexion to roughly 90-100 degrees per protocol can increase to 125 degrees as tolerated. X 8 minutes total with rest breaks provided with notable increase in ROM (just stiffness at the end of the range).                            PT Education - 11/28/16 1607    Education provided Yes   Education Details Per protocol can progress PROM in ER and flexion.    Person(s) Educated Patient;Spouse   Methods Explanation;Demonstration  Comprehension Verbalized understanding;Returned demonstration             PT Long Term Goals - 11/13/16 0820      PT LONG TERM GOAL #1   Title Patient will demonstrate at least 150 degrees of active shoulder flexion to perform overhead activities.    Baseline Unable - precautions    Time 16   Period Weeks   Status New     PT LONG TERM GOAL #2   Title Patient will be able to perform light lifting duties (10# or less) at work to complete work related duties with no increase in pain.    Baseline Unable - precautions    Time 16   Period Weeks   Status New     PT LONG TERM GOAL #3   Title Patient will report worst pain  of no more than 2/10 in LUE to demonstrate improved tolerance for work related activities.    Baseline No pain currently, but multi-modal use of pain medications/interventions.    Time 16   Period Weeks   Status New     PT LONG TERM GOAL #4   Title Patient will report Quick Dash score of less than 25% to demonstrate improved tolerance for ADLs.    Baseline Did not complete due to immobilization.    Time 16   Period Weeks   Status New               Plan - 11/28/16 1609    Clinical Impression Statement Patient is allowed to increase PROM into both ER and flexion this date and tolerates progression of PROM well with sense of stretching (hints of very mild pain at times, but immediately reduced ROM). He is doing well with pain control and progressing towards ROM goals well at this time.    Clinical Presentation Stable   Clinical Decision Making Moderate   Clinical Impairments Affecting Rehab Potential Significant repair, 3rd arthroscopic shoulder sx    PT Frequency 2x / week   PT Duration 12 weeks   PT Treatment/Interventions Aquatic Therapy;Moist Heat;Iontophoresis 4mg /ml Dexamethasone;Cryotherapy;Electrical Stimulation;Therapeutic exercise;Therapeutic activities;Neuromuscular re-education;Taping;Manual techniques;Dry needling;Biofeedback;Passive range of motion   PT Next Visit Plan Progress ROM and manual interventions per protocol    PT Home Exercise Plan Passive ER within designated guidelines, same for PROM flexion.    Consulted and Agree with Plan of Care Patient;Family member/caregiver   Family Member Consulted Spouse       Patient will benefit from skilled therapeutic intervention in order to improve the following deficits and impairments:  Pain, Improper body mechanics, Decreased range of motion, Decreased strength, Impaired UE functional use, Decreased activity tolerance, Decreased knowledge of precautions  Visit Diagnosis: Muscle weakness (generalized)  Chronic left  shoulder pain     Problem List Patient Active Problem List   Diagnosis Date Noted  . Trochanteric bursitis, left hip 02/01/2016  . Pre-diabetes 08/05/2015  . Seasonal allergies 08/05/2015  . Elevated lipids 08/05/2015  . Chest tightness 06/11/2015  . Cutaneous skin tags 01/05/2015  . GERD without esophagitis 12/07/2014  . BPH (benign prostatic hyperplasia) 12/07/2014  . Diastolic CHF (HCC) 06/09/2014  . Shortness of breath 06/09/2013  . H/O mitral valve repair 06/03/2012  . Heart palpitations 06/03/2012  . Dizziness 06/03/2012   Alva Garnet PT, DPT, CSCS    11/28/2016, 4:14 PM  [place] [facility] [address] [place], [address] Phone: [phone]   Fax:  [phone]  Name: Austin Stewart  Austin Stewart MRN: [id number] Date of Birth: 04/19/1956

## 2016-11-28 NOTE — Patient Instructions (Signed)
Scapular squeezes x 15 for 3 sets   PROM ER to roughly 40 degrees per protocol can increase to 45 degrees for the next 3 weeks   PROM flexion to roughly 90-100 degrees per protocol can increase to 125 degrees as tolerated.

## 2016-11-30 ENCOUNTER — Ambulatory Visit: Payer: 59 | Attending: Orthopaedic Surgery | Admitting: Physical Therapy

## 2016-11-30 DIAGNOSIS — M25512 Pain in left shoulder: Secondary | ICD-10-CM | POA: Insufficient documentation

## 2016-11-30 DIAGNOSIS — G8929 Other chronic pain: Secondary | ICD-10-CM | POA: Diagnosis not present

## 2016-11-30 DIAGNOSIS — M6281 Muscle weakness (generalized): Secondary | ICD-10-CM | POA: Diagnosis not present

## 2016-11-30 NOTE — Patient Instructions (Signed)
PROM ER at roughly 20 degrees of abduction  PROM Flexion

## 2016-12-03 NOTE — Therapy (Signed)
[facility] [address] [place], [place], [address] Phone: [phone]   Fax:  [phone]  Physical Therapy Treatment  Patient Details  Name: Austin Stewart MRN: [id number] Date of Birth: 01/01/1956 Referring Provider: Dr. Austin MilesAllison Toth  Encounter Date: 11/30/2016      PT End of Session - 12/03/16 2301    Visit Number 7   Number of Visits 25   Date for PT Re-Evaluation 02/19/17   PT Start Time 1700   PT Stop Time 1725   PT Time Calculation (min) 25 min   Equipment Utilized During Treatment --  Shoulder sling   Activity Tolerance Patient tolerated treatment well   Behavior During Therapy [facility] for tasks assessed/performed      Past Medical History:  Diagnosis Date  . Allergic rhinitis, cause unspecified   . Allergy   . Cardiac dysrhythmia, unspecified   . Diverticulosis of colon (without mention of hemorrhage)   . Dysrhythmia   . Esophageal reflux   . Measles without mention of complication   . Mumps without mention of complication   . Osteoarthrosis, unspecified whether generalized or localized, unspecified site   . 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 Unspecified hyperplasia of prostate without urinary obstruction and other lower urinary tract symptoms (LUTS)     Past Surgical History:  Procedure Laterality Date  . CARDIAC CATHETERIZATION  2006   [facility]   . HERNIA REPAIR    . MITRAL VALVE REPAIR    . rotator cuff repair    . SHOULDER ARTHROSCOPY Left 08/24/2016   Procedure: ARTHROSCOPY SHOULDER with debridement;  Surgeon: Juanell FairlyKevin Krasinski, MD;  Location: [facility] ORS;  Service: Orthopedics;  Laterality: Left;  . TONSILLECTOMY      There were no vitals filed for this visit.      Subjective Assessment - 12/03/16 2302    Subjective Patient reports he had muscle soreness which has largely dissipated after previous session.    Patient is accompained by: Family  member   Pertinent History Previously failed rotator cuff repair from years ago, biceps tenodesis performed years ago in that procedure.    Limitations Lifting;House hold activities   Diagnostic tests Intra-operative findings indicative of split cuff tendon and full tear of infraspinatus and supraspinatus   Patient Stated Goals To return to full work duties.    Currently in Pain? Other (Comment)  Very slight tightness/tenderness in superior and posterior portion of shoulder        PROM ER at roughly 20 degrees of abduction x 5 bouts x 1-2 minutes per bout through tolerated ROM between 30-45 degrees per protocol   PROM Flexion up to 105 degrees x 5 bouts x 1-3 minutes per bout through tolerated ROM per protocol                           PT Education - 12/03/16 2302    Education provided Yes   Education Details He is right on target with his ROM and pain control.    Person(s) Educated Patient   Methods Explanation;Demonstration   Comprehension Verbalized understanding;Returned demonstration             PT Long Term Goals - 11/13/16 0820      PT LONG TERM GOAL #1   Title Patient will demonstrate at least 150 degrees of active shoulder flexion to perform overhead activities.  Baseline Unable - precautions    Time 16   Period Weeks   Status New     PT LONG TERM GOAL #2   Title Patient will be able to perform light lifting duties (10# or less) at work to complete work related duties with no increase in pain.    Baseline Unable - precautions    Time 16   Period Weeks   Status New     PT LONG TERM GOAL #3   Title Patient will report worst pain of no more than 2/10 in LUE to demonstrate improved tolerance for work related activities.    Baseline No pain currently, but multi-modal use of pain medications/interventions.    Time 16   Period Weeks   Status New     PT LONG TERM GOAL #4   Title Patient will report Quick Dash score of less than 25% to  demonstrate improved tolerance for ADLs.    Baseline Did not complete due to immobilization.    Time 16   Period Weeks   Status New               Plan - 12/03/16 2301    Clinical Impression Statement Patient continues to demonstrate improving tolerance to PROM and continues to have good pain control. He is progressing well towards established ROM goals for this phase of rehab.    Clinical Presentation Stable   Clinical Decision Making Moderate   Clinical Impairments Affecting Rehab Potential Significant repair, 3rd arthroscopic shoulder sx    PT Frequency 2x / week   PT Duration 12 weeks   PT Treatment/Interventions Aquatic Therapy;Moist Heat;Iontophoresis 4mg /ml Dexamethasone;Cryotherapy;Electrical Stimulation;Therapeutic exercise;Therapeutic activities;Neuromuscular re-education;Taping;Manual techniques;Dry needling;Biofeedback;Passive range of motion   PT Next Visit Plan Progress ROM and manual interventions per protocol    PT Home Exercise Plan Passive ER within designated guidelines, same for PROM flexion.    Consulted and Agree with Plan of Care Patient;Family member/caregiver   Family Member Consulted Spouse       Patient will benefit from skilled therapeutic intervention in order to improve the following deficits and impairments:  Pain, Improper body mechanics, Decreased range of motion, Decreased strength, Impaired UE functional use, Decreased activity tolerance, Decreased knowledge of precautions  Visit Diagnosis: Muscle weakness (generalized)  Chronic left shoulder pain     Problem List Patient Active Problem List   Diagnosis Date Noted  . Trochanteric bursitis, left hip 02/01/2016  . Pre-diabetes 08/05/2015  . Seasonal allergies 08/05/2015  . Elevated lipids 08/05/2015  . Chest tightness 06/11/2015  . Cutaneous skin tags 01/05/2015  . GERD without esophagitis 12/07/2014  . BPH (benign prostatic hyperplasia) 12/07/2014  . Diastolic CHF (HCC) 06/09/2014   . Shortness of breath 06/09/2013  . H/O mitral valve repair 06/03/2012  . Heart palpitations 06/03/2012  . Dizziness 06/03/2012   Alva GarnetPatrick Johari Pinney PT, DPT, CSCS    12/03/2016, 11:03 PM   [facility] [address] [place], [place], [address] Phone: [phone]   Fax:  [phone]  Name: Austin Stewart MRN: [id number] Date of Birth: 01/15/1956

## 2016-12-04 ENCOUNTER — Ambulatory Visit: Payer: 59 | Admitting: Physical Therapy

## 2016-12-04 DIAGNOSIS — G8929 Other chronic pain: Secondary | ICD-10-CM | POA: Diagnosis not present

## 2016-12-04 DIAGNOSIS — M25512 Pain in left shoulder: Secondary | ICD-10-CM

## 2016-12-04 DIAGNOSIS — M6281 Muscle weakness (generalized): Secondary | ICD-10-CM

## 2016-12-04 NOTE — Patient Instructions (Signed)
PROM into ER up to 40-42 degrees (per protocol to go to 45 degrees)   Passive flexion

## 2016-12-05 NOTE — Therapy (Signed)
[place] [facility] PH[facility] MEDICINE [address] [place], [place], [address] Phone: [phone]   Fax:  [phone]  Physical Therapy Treatment  Patient Details  Name: Austin Stewart MRN: [id number] Date of Birth: 11/09/1955 Referring Provider: Dr. Austin MilesAllison Toth  Encounter Date: 12/04/2016      PT End of Session - 12/05/16 1106    Visit Number 8   Number of Visits 25   Date for PT Re-Evaluation 02/19/17   PT Start Time 1615   PT Stop Time 1648   PT Time Calculation (min) 33 min   Equipment Utilized During Treatment --  Shoulder sling   Activity Tolerance Patient tolerated treatment well   Behavior During Therapy [facility] tasks assessed/performed      Past Medical History:  Diagnosis Date  . Allergic rhinitis, cause unspecified   . Allergy   . Cardiac dysrhythmia, unspecified   . Diverticulosis of colon (without mention of hemorrhage)   . Dysrhythmia   . Esophageal reflux   . Measles without mention of complication   . Mumps without mention of complication   . Osteoarthrosis, unspecified whether generalized or localized, unspecified site   . 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 Unspecified hyperplasia of prostate without urinary obstruction and other lower urinary tract symptoms (LUTS)     Past Surgical History:  Procedure Laterality Date  . CARDIAC CATHETERIZATION  2006   [facility]   . HERNIA REPAIR    . MITRAL VALVE REPAIR    . rotator cuff repair    . SHOULDER ARTHROSCOPY Left 08/24/2016   Procedure: ARTHROSCOPY SHOULDER with debridement;  Surgeon: Juanell FairlyKevin Krasinski, MD;  Location: [facility] ORS;  Service: Orthopedics;  Laterality: Left;  . TONSILLECTOMY      There were no vitals filed for this visit.      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 His pain levels have been low to  non-existent. Reports his shoulder feels a little bit "tighter" than it has today.    Patient is accompained by: Family member   Pertinent History Previously failed rotator cuff repair from years ago, biceps tenodesis performed years ago in that procedure.    Limitations Lifting;House hold activities   Diagnostic tests Intra-operative findings indicative of split cuff tendon and full tear of infraspinatus and supraspinatus   Patient Stated Goals To return to full work duties.    Currently in Pain? No/denies      PROM into ER up to 40-42 degrees (per protocol to go to 45 degrees) -- initially limited ~50% from previous sessions, however with multiple prolonged bouts of low load longer duration stretching with gentle oscillations at end range, noted to increase passive motion to 40-42 degrees with no increase in pain.   Passive flexion -- multiple bouts performed up to roughly 100-110 degrees before soft tissue restriction felt by both patient and therapist, no discomfort reported aside from tightness noted by patient. He reports this motion feels much less restricted than in previous sessions.                            PT Education - 12/05/16 1105    Education provided Yes   Education Details Continue to keep arm supported per protocol at all time, will try to find a time  that PT can instruct in pool exercises per protocol.    Person(s) Educated Patient   Methods Explanation;Demonstration   Comprehension Verbalized understanding;Returned demonstration             PT Long Term Goals - 12/05/16 1110      PT LONG TERM GOAL #1   Title Patient will demonstrate at least 150 degrees of active shoulder flexion to perform overhead activities   Baseline Unable - precautions    Time 16   Period Weeks   Status New     PT LONG TERM GOAL #2   Title Patient will be able to perform light lifting duties (10# or less) at work to complete work related duties with no increase  in pain.    Baseline Unable - precautions    Time 16   Period Weeks   Status New     PT LONG TERM GOAL #3   Title Patient will report worst pain of no more than 2/10 in LUE to demonstrate improved tolerance for work related activities.    Baseline No pain currently, but multi-modal use of pain medications/interventions.    Time 16   Period Weeks   Status New     PT LONG TERM GOAL #4   Title Patient will report Quick Dash score of less than 25% to demonstrate improved tolerance for ADLs.    Baseline Did not complete due to immobilization.    Time 16   Period Weeks   Status New               Plan - 12/05/16 1107    Clinical Impression Statement Patient continues to report adherence to surgical protocol and has had very little if any pain. He is tolerating increase in PROM per protocol with "tightness" sensation more than pain at end range. His PROM is progressing nicely towards established goals of this phase.    Clinical Presentation Stable   Clinical Decision Making High   Clinical Impairments Affecting Rehab Potential Significant repair, 3rd arthroscopic shoulder sx    PT Frequency 2x / week   PT Duration 12 weeks   PT Treatment/Interventions Aquatic Therapy;Moist Heat;Iontophoresis 4mg /ml Dexamethasone;Cryotherapy;Electrical Stimulation;Therapeutic exercise;Therapeutic activities;Neuromuscular re-education;Taping;Manual techniques;Dry needling;Biofeedback;Passive range of motion   PT Next Visit Plan Progress ROM and manual interventions per protocol    PT Home Exercise Plan Passive ER within designated guidelines, same for PROM flexion.    Consulted and Agree with Plan of Care Patient;Family member/caregiver   Family Member Consulted Spouse       Patient will benefit from skilled therapeutic intervention in order to improve the following deficits and impairments:  Pain, Improper body mechanics, Decreased range of motion, Decreased strength, Impaired UE functional use,  Decreased activity tolerance, Decreased knowledge of precautions  Visit Diagnosis: Muscle weakness (generalized)  Chronic left shoulder pain     Problem List Patient Active Problem List   Diagnosis Date Noted  . Trochanteric bursitis, left hip 02/01/2016  . Pre-diabetes 08/05/2015  . Seasonal allergies 08/05/2015  . Elevated lipids 08/05/2015  . Chest tightness 06/11/2015  . Cutaneous skin tags 01/05/2015  . GERD without esophagitis 12/07/2014  . BPH (benign prostatic hyperplasia) 12/07/2014  . Diastolic CHF (HCC) 06/09/2014  . Shortness of breath 06/09/2013  . H/O mitral valve repair 06/03/2012  . Heart palpitations 06/03/2012  . Dizziness 06/03/2012   Alva Garnet PT, DPT, CSCS    12/05/2016, 11:12 AM  [place] [facility] PHYSICAL AND SPORTS MEDICINE [address]  [place], [place], [address] Phone: [phone]   Fax:  [phone]  Name: Austin Stewart MRN: [id number] Date of Birth: Sep 16, 1955

## 2016-12-07 ENCOUNTER — Ambulatory Visit: Payer: 59 | Admitting: Physical Therapy

## 2016-12-07 DIAGNOSIS — G8929 Other chronic pain: Secondary | ICD-10-CM

## 2016-12-07 DIAGNOSIS — M25512 Pain in left shoulder: Secondary | ICD-10-CM | POA: Diagnosis not present

## 2016-12-07 DIAGNOSIS — M6281 Muscle weakness (generalized): Secondary | ICD-10-CM | POA: Diagnosis not present

## 2016-12-07 NOTE — Therapy (Signed)
[place] [facility] PHYS[facility]TS MEDICINE [address] [place], [place], [address] Phone: [phone]   Fax:  [phone]  Physical Therapy Treatment  Patient Details  Name: Austin MarrowRobert D Stewart MRN: [id number] Date of Birth: 09/19/1955 Referring Provider: Dr. Austin MilesAllison Toth  Encounter Date: 12/07/2016      PT End of Session - 12/07/16 1809    Visit Number 9   Number of Visits 25   Date for PT Re-Evaluation 02/19/17   PT Start Time 1635   PT Stop Time 1705   PT Time Calculation (min) 30 min   Equipment Utilized During Treatment --  Shoulder sling   Activity Tolerance Patient tolerated treatment well   Behavior During Therapy [facility] for tasks assessed/performed      Past Medical History:  Diagnosis Date  . Allergic rhinitis, cause unspecified   . Allergy   . Cardiac dysrhythmia, unspecified   . Diverticulosis of colon (without mention of hemorrhage)   . Dysrhythmia   . Esophageal reflux   . Measles without mention of complication   . Mumps without mention of complication   . Osteoarthrosis, unspecified whether generalized or localized, unspecified site   . 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 Unspecified hyperplasia of prostate without urinary obstruction and other lower urinary tract symptoms (LUTS)     Past Surgical History:  Procedure Laterality Date  . CARDIAC CATHETERIZATION  2006   [facility]   . HERNIA REPAIR    . MITRAL VALVE REPAIR    . rotator cuff repair    . SHOULDER ARTHROSCOPY Left 08/24/2016   Procedure: ARTHROSCOPY SHOULDER with debridement;  Surgeon: Juanell FairlyKevin Krasinski, MD;  Location: [facility] ORS;  Service: Orthopedics;  Laterality: Left;  . TONSILLECTOMY      There were no vitals filed for this visit.      Subjective Assessment - 12/07/16 1807    Subjective Patient reports he has maintained precautions, keeping his arm supported on his desk at work. He reports he still has some  soreness after stretching, but largely is pain free.   Patient is accompained by: Family member   Pertinent History Previously failed rotator cuff repair from years ago, biceps tenodesis performed years ago in that procedure.    Limitations Lifting;House hold activities   Diagnostic tests Intra-operative findings indicative of split cuff tendon and full tear of infraspinatus and supraspinatus   Patient Stated Goals To return to full work duties.    Currently in Pain? No/denies      Forward elevation with bent elbow x 4 sets x 10 repetitions in neck high water (shoulder totally submerged with RUE supporting at the elbow)  Scapular plane elevation with bent elbow and RUE supporting at the elbow x 15 for 3 sets   Scapular retractions with elbow support x 12 for 3 sets with RUE supporting at the elbow   External rotation with active assist and elbow support x 10 for 3 sets with RUE supporting at the elbow                           PT Education - 12/07/16 1808    Education provided Yes   Education Details Call therapist tomorrow to follow up on his symptoms after therapy session this date and will progress pool as tolerated.    Person(s) Educated Patient   Methods Explanation;Demonstration   Comprehension Verbalized understanding;Returned demonstration  PT Long Term Goals - 12/05/16 1110      PT LONG TERM GOAL #1   Title Patient will demonstrate at least 150 degrees of active shoulder flexion to perform overhead activities   Baseline Unable - precautions    Time 16   Period Weeks   Status New     PT LONG TERM GOAL #2   Title Patient will be able to perform light lifting duties (10# or less) at work to complete work related duties with no increase in pain.    Baseline Unable - precautions    Time 16   Period Weeks   Status New     PT LONG TERM GOAL #3   Title Patient will report worst pain of no more than 2/10 in LUE to demonstrate improved  tolerance for work related activities.    Baseline No pain currently, but multi-modal use of pain medications/interventions.    Time 16   Period Weeks   Status New     PT LONG TERM GOAL #4   Title Patient will report Quick Dash score of less than 25% to demonstrate improved tolerance for ADLs.    Baseline Did not complete due to immobilization.    Time 16   Period Weeks   Status New               Plan - 12/07/16 1809    Clinical Impression Statement Per protocol at 3 weeks post-op patient is appropriate to take into the pool, so long as his arm is fully submerged. Patient performed active assisted forward elevation in scapular plane, and active assisted external rotation with cuing to perform slowly with no ripples. He reports some achiness, though not pain more consistent with a "workout". Will monitor for symptoms tomorrow and incorporate pool work as tolerated.    Clinical Presentation Stable   Clinical Decision Making High   Clinical Impairments Affecting Rehab Potential Significant repair, 3rd arthroscopic shoulder sx    PT Frequency 2x / week   PT Duration 12 weeks   PT Treatment/Interventions Aquatic Therapy;Moist Heat;Iontophoresis 4mg /ml Dexamethasone;Cryotherapy;Electrical Stimulation;Therapeutic exercise;Therapeutic activities;Neuromuscular re-education;Taping;Manual techniques;Dry needling;Biofeedback;Passive range of motion   PT Next Visit Plan Progress ROM and manual interventions per protocol    PT Home Exercise Plan Passive ER within designated guidelines, same for PROM flexion.    Consulted and Agree with Plan of Care Patient;Family member/caregiver   Family Member Consulted Spouse       Patient will benefit from skilled therapeutic intervention in order to improve the following deficits and impairments:  Pain, Improper body mechanics, Decreased range of motion, Decreased strength, Impaired UE functional use, Decreased activity tolerance, Decreased knowledge of  precautions  Visit Diagnosis: Muscle weakness (generalized)  Chronic left shoulder pain     Problem List Patient Active Problem List   Diagnosis Date Noted  . Trochanteric bursitis, left hip 02/01/2016  . Pre-diabetes 08/05/2015  . Seasonal allergies 08/05/2015  . Elevated lipids 08/05/2015  . Chest tightness 06/11/2015  . Cutaneous skin tags 01/05/2015  . GERD without esophagitis 12/07/2014  . BPH (benign prostatic hyperplasia) 12/07/2014  . Diastolic CHF (HCC) 06/09/2014  . Shortness of breath 06/09/2013  . H/O mitral valve repair 06/03/2012  . Heart palpitations 06/03/2012  . Dizziness 06/03/2012   Alva Garnet PT, DPT, CSCS    12/07/2016, 6:12 PM  [place] [facility] PHYSICAL AND SPORTS MEDICINE [address] [place], [address] Phone: [phone]   Fax:  [phone]  Name: Molly Maduro  Lorne SkeensD Kruger MRN: [id number] Date of Birth: 04/19/1956

## 2016-12-11 ENCOUNTER — Ambulatory Visit: Payer: 59 | Admitting: Physical Therapy

## 2016-12-11 DIAGNOSIS — M6281 Muscle weakness (generalized): Secondary | ICD-10-CM | POA: Diagnosis not present

## 2016-12-11 DIAGNOSIS — M25512 Pain in left shoulder: Secondary | ICD-10-CM | POA: Diagnosis not present

## 2016-12-11 DIAGNOSIS — G8929 Other chronic pain: Secondary | ICD-10-CM | POA: Diagnosis not present

## 2016-12-12 ENCOUNTER — Encounter: Payer: Self-pay | Admitting: Urology

## 2016-12-12 ENCOUNTER — Ambulatory Visit (INDEPENDENT_AMBULATORY_CARE_PROVIDER_SITE_OTHER): Payer: 59 | Admitting: Urology

## 2016-12-12 VITALS — BP 119/83 | HR 69 | Ht 70.0 in | Wt 194.9 lb

## 2016-12-12 DIAGNOSIS — N138 Other obstructive and reflux uropathy: Secondary | ICD-10-CM

## 2016-12-12 DIAGNOSIS — R350 Frequency of micturition: Secondary | ICD-10-CM

## 2016-12-12 DIAGNOSIS — R3912 Poor urinary stream: Secondary | ICD-10-CM | POA: Diagnosis not present

## 2016-12-12 DIAGNOSIS — N521 Erectile dysfunction due to diseases classified elsewhere: Secondary | ICD-10-CM | POA: Diagnosis not present

## 2016-12-12 DIAGNOSIS — N401 Enlarged prostate with lower urinary tract symptoms: Secondary | ICD-10-CM

## 2016-12-12 MED ORDER — TAMSULOSIN HCL 0.4 MG PO CAPS: 0.4000 mg | ORAL_CAPSULE | Freq: Two times a day (BID) | ORAL | 3 refills | Status: DC

## 2016-12-12 NOTE — Therapy (Signed)
[place] [facility] PH[facility]ORTS MEDICINE [address], [place], [address] Phone: [phone]   Fax:  [phone]  Physical Therapy Treatment  Patient Details  Name: Austin Stewart MRN: [id number] Date of Birth: 29-Oct-1955 Referring Provider: Dr. Austin Miles  Encounter Date: 12/11/2016      PT End of Session - 12/11/16 1627    Visit Number 10   Number of Visits 25   Date for PT Re-Evaluation 02/19/17   PT Start Time 1622   PT Stop Time 1646   PT Time Calculation (min) 24 min   Equipment Utilized During Treatment --  Shoulder sling   Activity Tolerance Patient tolerated treatment well   Behavior During Therapy [facility] for tasks assessed/performed      Past Medical History:  Diagnosis Date  . Allergic rhinitis, cause unspecified   . Allergy   . Cardiac dysrhythmia, unspecified   . Diverticulosis of colon (without mention of hemorrhage)   . Dysrhythmia   . Esophageal reflux   . Measles without mention of complication   . Mumps without mention of complication   . Osteoarthrosis, unspecified whether generalized or localized, unspecified site   . 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 Unspecified hyperplasia of prostate without urinary obstruction and other lower urinary tract symptoms (LUTS)     Past Surgical History:  Procedure Laterality Date  . CARDIAC CATHETERIZATION  2006   [facility]   . HERNIA REPAIR    . MITRAL VALVE REPAIR    . rotator cuff repair    . SHOULDER ARTHROSCOPY Left 08/24/2016   Procedure: ARTHROSCOPY SHOULDER with debridement;  Surgeon: Juanell Fairly, MD;  Location: [facility] OR[facility];  Service: Orthopedics;  Laterality: Left;  . TONSILLECTOMY      There were no vitals filed for this visit.      Subjective Assessment - 12/11/16 1625    Subjective Patient reports he felt fine on Friday, but had some increased soreness on Saturday which has since eased. It remained fairly  low level and did not keep him up at night or require him to take anything to ease.    Patient is accompained by: Family member   Pertinent History Previously failed rotator cuff repair from years ago, biceps tenodesis performed years ago in that procedure.    Limitations Lifting;House hold activities   Diagnostic tests Intra-operative findings indicative of split cuff tendon and full tear of infraspinatus and supraspinatus   Patient Stated Goals To return to full work duties.    Currently in Pain? Yes   Pain Score 1    Pain Location Shoulder   Pain Orientation Left   Pain Descriptors / Indicators Aching   Pain Type Chronic pain;Surgical pain   Pain Onset More than a month ago   Pain Frequency Intermittent      PROM shoulder flexion with elbow bent to 100-105 degrees before soft tissue limitation before soft tissue limitation x 12 minutes total (including rest breaks) with mild increase in pain/achiness just a feeling of stiffness reported. Pain/achiness reduced once his UE was lowered.    PROM External rotation to roughly 35 degrees before soft tissue limitation x 10 minutes total (including rest breaks) with no increase in pain/achiness just a feeling of stiffness reported.                            PT Education - 12/11/16 1653  Education provided Yes   Education Details Call therapist wednesday to determine if land vs aquatic therapy based on his availability.    Person(s) Educated Patient   Methods Explanation;Demonstration   Comprehension Verbalized understanding;Returned demonstration             PT Long Term Goals - 12/05/16 1110      PT LONG TERM GOAL #1   Title Patient will demonstrate at least 150 degrees of active shoulder flexion to perform overhead activities   Baseline Unable - precautions    Time 16   Period Weeks   Status New     PT LONG TERM GOAL #2   Title Patient will be able to perform light lifting duties (10# or less) at  work to complete work related duties with no increase in pain.    Baseline Unable - precautions    Time 16   Period Weeks   Status New     PT LONG TERM GOAL #3   Title Patient will report worst pain of no more than 2/10 in LUE to demonstrate improved tolerance for work related activities.    Baseline No pain currently, but multi-modal use of pain medications/interventions.    Time 16   Period Weeks   Status New     PT LONG TERM GOAL #4   Title Patient will report Quick Dash score of less than 25% to demonstrate improved tolerance for ADLs.    Baseline Did not complete due to immobilization.    Time 16   Period Weeks   Status New               Plan - 12/11/16 1627    Clinical Impression Statement Patient reported some soreness 2 days after his aquatic session (consistent with DOMS), which has since eased. He continues to have soft tissue limitation in flexion at roughly 105 degrees, minimal soft tissue restrictions noted in ER through protocol allowed ROM. Patient will be progressed in pool as appropriate (likely decreasing volume).    Clinical Presentation Stable   Clinical Decision Making Moderate   Rehab Potential Good   Clinical Impairments Affecting Rehab Potential Significant repair, 3rd arthroscopic shoulder sx    PT Frequency 2x / week   PT Duration 12 weeks   PT Treatment/Interventions Aquatic Therapy;Moist Heat;Iontophoresis 4mg /ml Dexamethasone;Cryotherapy;Electrical Stimulation;Therapeutic exercise;Therapeutic activities;Neuromuscular re-education;Taping;Manual techniques;Dry needling;Biofeedback;Passive range of motion   PT Next Visit Plan Progress ROM and manual interventions per protocol    PT Home Exercise Plan Passive ER within designated guidelines, same for PROM flexion.    Consulted and Agree with Plan of Care Patient;Family member/caregiver   Family Member Consulted Spouse       Patient will benefit from skilled therapeutic intervention in order to  improve the following deficits and impairments:  Pain, Improper body mechanics, Decreased range of motion, Decreased strength, Impaired UE functional use, Decreased activity tolerance, Decreased knowledge of precautions  Visit Diagnosis: Muscle weakness (generalized)  Chronic left shoulder pain     Problem List Patient Active Problem List   Diagnosis Date Noted  . Trochanteric bursitis, left hip 02/01/2016  . Pre-diabetes 08/05/2015  . Seasonal allergies 08/05/2015  . Elevated lipids 08/05/2015  . Chest tightness 06/11/2015  . Cutaneous skin tags 01/05/2015  . GERD without esophagitis 12/07/2014  . BPH (benign prostatic hyperplasia) 12/07/2014  . Diastolic CHF (HCC) 06/09/2014  . Shortness of breath 06/09/2013  . H/O mitral valve repair 06/03/2012  . Heart palpitations 06/03/2012  . Dizziness 06/03/2012  Alva Garnet PT, DPT, CSCS    12/12/2016, 10:31 AM  [place] [facility] PHYSICAL AND SPORTS MEDICINE [address], [place], [address] Phone: [phone]   Fax:  [phone]  Name: Austin Stewart MRN: [id number] Date of Birth: 1955-06-10

## 2016-12-12 NOTE — Progress Notes (Signed)
12/12/2016 3:12 PM   Austin Stewart 1955/05/12 [id number]  Referring provider: Smitty Cords, DO [address] [place], [place] [address]  Chief Complaint  Patient presents with  . Erectile Dysfunction    HPI:  61 yo male referred for ED. SHIM score is 15. He tried Viagra and it worked well. Also, he has BPH with frequency, urgency, weak stream and straining. He's had that for years. He has a weak stream if he misses a dose and a weak stream in the evenings (he takes tamsulosin in AM). He had a recent episode of left testicle pain and took Cipro. Pain improved. No vasectomy. PSA was 0.04 Jun 2016.    PMH: Past Medical History:  Diagnosis Date  . Allergic rhinitis, cause unspecified   . Allergy   . Cardiac dysrhythmia, unspecified   . Diverticulosis of colon (without mention of hemorrhage)   . Dysrhythmia   . Esophageal reflux   . Measles without mention of complication   . Mumps without mention of complication   . Osteoarthrosis, unspecified whether generalized or localized, unspecified site   . 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 Unspecified hyperplasia of prostate without urinary obstruction and other lower urinary tract symptoms (LUTS)     Surgical History: Past Surgical History:  Procedure Laterality Date  . CARDIAC CATHETERIZATION  2006   [facility]   . HERNIA REPAIR    . MITRAL VALVE REPAIR    . rotator cuff repair    . SHOULDER ARTHROSCOPY Left 08/24/2016   Procedure: ARTHROSCOPY SHOULDER with debridement;  Surgeon: Juanell Fairly, MD;  Location: [facility] ORS;  Service: Orthopedics;  Laterality: Left;  . TONSILLECTOMY      Home Medications:  Allergies as of 12/12/2016      Reactions   Other Swelling   PEACHES  SWELLING OF FACE      Medication List       Accurate as of 12/12/16  3:12 PM. Always use your most recent med list.          aspirin 81 MG tablet Take 81 mg by mouth daily.   [facility] ALLERGY RELIEF  0.025-0.3 % ophthalmic solution Generic drug:  naphazoline-pheniramine Place 1 drop into both eyes 4 (four) times daily as needed for irritation.   flecainide 50 MG tablet Commonly known as:  TAMBOCOR TAKE 1 TABLET BY MOUTH 2 TIMES DAILY.   fluticasone 50 MCG/ACT nasal spray Commonly known as:  FLONASE Place 2 sprays into both nostrils daily. Dispense 3 bottles.   loratadine 10 MG tablet Commonly known as:  CLARITIN Take 1 tablet (10 mg total) by mouth daily.   metoprolol succinate 25 MG 24 hr tablet Commonly known as:  TOPROL-XL TAKE 1 TABLET BY MOUTH DAILY.   montelukast 10 MG tablet Commonly known as:  SINGULAIR Take 1 tablet (10 mg total) by mouth at bedtime.   multivitamin with minerals tablet Take 1 tablet by mouth daily.   omeprazole 20 MG capsule Commonly known as:  PRILOSEC TAKE 1 CAPSULE BY MOUTH DAILY.   sildenafil 20 MG tablet Commonly known as:  REVATIO Take 1-5 pills about 30 min prior to sex. Start with 1 and increase as needed.   sodium chloride 0.65 % Soln nasal spray Commonly known as:  OCEAN Place 1 spray into both nostrils as needed for congestion.   tamsulosin 0.4 MG Caps capsule Commonly known as:  FLOMAX TAKE 1 CAPSULE BY MOUTH DAILY.  Turmeric 500 MG Caps Take 2 capsules by mouth daily.   Zoster Vac Recomb Adjuvanted injection Commonly known as:  SHINGRIX Inject 0.5 mLs into the muscle every 6 (six) months. For series of 2 injections total       Allergies:  Allergies  Allergen Reactions  . Other Swelling    PEACHES  SWELLING OF FACE    Family History: Family History  Problem Relation Age of Onset  . Heart disease Mother        s/p stent placement  . Hypertension Mother   . Emphysema Father   . Dementia Father   . Cancer Maternal Uncle        kidney cancer    Social History:  reports that he has never smoked. He has never used smokeless tobacco. He reports that he does not drink alcohol or use drugs.  ROS:                                          Physical Exam: There were no vitals taken for this visit.  Constitutional:  Alert and oriented, No acute distress. HEENT: [place] AT, moist mucus membranes.  Trachea midline, no masses. Cardiovascular: No clubbing, cyanosis, or edema. Respiratory: Normal respiratory effort, no increased work of breathing. GI: Abdomen is soft, nontender, nondistended, no abdominal masses GU: No CVA tenderness. Penis was without lesion. Scrotum normal. Testicles and epididymides palpably normal bilaterally. On digital rectal exam the prostate was 40-50 g, smooth without hard area or nodule. Skin: No rashes, bruises or suspicious lesions. Lymph: No cervical or inguinal adenopathy. Neurologic: Grossly intact, no focal deficits, moving all 4 extremities. Psychiatric: Normal mood and affect.  Laboratory Data: Lab Results  Component Value Date   WBC 8.4 08/21/2016   HGB 15.0 08/21/2016   HCT 43.3 08/21/2016   MCV 87.6 08/21/2016   PLT 372 08/21/2016    Lab Results  Component Value Date   CREATININE 1.02 08/21/2016    Lab Results  Component Value Date   PSA 0.4 06/16/2016    No results found for: TESTOSTERONE  Lab Results  Component Value Date   HGBA1C 5.2 10/26/2016    Urinalysis No results found for: COLORURINE, APPEARANCEUR, LABSPEC, PHURINE, GLUCOSEU, HGBUR, BILIRUBINUR, KETONESUR, PROTEINUR, UROBILINOGEN, NITRITE, LEUKOCYTESUR   Assessment & Plan:   bph with LUTS - he's on tamsulosin. He has bothersome frequency, straining. Discussed adding tamsulosin in PM (making it BID), adding an OAB med, cysto to consider URolift or laser. He elects to start BID tamsulosin.  ED - stable on sildenafil.   See in 3 months.   There are no diagnoses linked to this encounter.  No Follow-up on file.  Jerilee FieldESKRIDGE, Cleophus Mendonsa, MD  [facility] [address], [address] [place], [place] [address] [phone]

## 2016-12-13 ENCOUNTER — Ambulatory Visit: Payer: 59 | Admitting: Physical Therapy

## 2016-12-13 DIAGNOSIS — M25512 Pain in left shoulder: Secondary | ICD-10-CM | POA: Diagnosis not present

## 2016-12-13 DIAGNOSIS — M6281 Muscle weakness (generalized): Secondary | ICD-10-CM

## 2016-12-13 DIAGNOSIS — G8929 Other chronic pain: Secondary | ICD-10-CM | POA: Diagnosis not present

## 2016-12-13 NOTE — Therapy (Signed)
[facility] PH[facility]TS MEDICINE [address], [place], [address] Phone: [phone]   Fax:  [phone]  Physical Therapy Treatment  Patient Details  Name: Austin Stewart MRN: [id number] Date of Birth: March 15, 1956 Referring Provider: Dr. Austin Miles  Encounter Date: 12/13/2016      PT End of Session - 12/13/16 1227    Visit Number 11   Number of Visits 25   Date for PT Re-Evaluation 02/19/17   PT Start Time 1135   PT Stop Time 1200   PT Time Calculation (min) 25 min   Equipment Utilized During Treatment --  Shoulder sling   Activity Tolerance Patient tolerated treatment well   Behavior During Therapy [facility] for tasks assessed/performed      Past Medical History:  Diagnosis Date  . Allergic rhinitis, cause unspecified   . Allergy   . Cardiac dysrhythmia, unspecified   . Diverticulosis of colon (without mention of hemorrhage)   . Dysrhythmia   . Esophageal reflux   . Measles without mention of complication   . Mumps without mention of complication   . Osteoarthrosis, unspecified whether generalized or localized, unspecified site   . 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 Unspecified hyperplasia of prostate without urinary obstruction and other lower urinary tract symptoms (LUTS)     Past Surgical History:  Procedure Laterality Date  . CARDIAC CATHETERIZATION  2006   [facility]   . HERNIA REPAIR    . MITRAL VALVE REPAIR    . rotator cuff repair    . SHOULDER ARTHROSCOPY Left 08/24/2016   Procedure: ARTHROSCOPY SHOULDER with debridement;  Surgeon: Juanell Fairly, MD;  Location: [facility] ORS;  Service: Orthopedics;  Laterality: Left;  . TONSILLECTOMY      There were no vitals filed for this visit.      Subjective Assessment - 12/13/16 1226    Subjective Patient reports no pain, he is doing well and has been able to do some writing without discomfort (other than stiffness in his  fingers).    Patient is accompained by: Family member   Pertinent History Previously failed rotator cuff repair from years ago, biceps tenodesis performed years ago in that procedure.    Limitations Lifting;House hold activities   Diagnostic tests Intra-operative findings indicative of split cuff tendon and full tear of infraspinatus and supraspinatus   Patient Stated Goals To return to full work duties.    Currently in Pain? No/denies      PROM flexion in water above his shoulder x 15 repetitions with cuing not to break the water or cause a ripple (elbow flexed)  PROM ER x 15 repetitions with cuing to not make a ripple (elbow bent at side)   AAROM flexion in scapular plane with elbow bent and assistance from RUE x12 for 2 sets  [facility] ER with elbow at side with elbow bent and assistance from RUE x12 for 2 sets                           PT Education - 12/13/16 1227    Education provided Yes   Education Details Can begin to wean from sling at home with goal of full wean by next week.    Person(s) Educated Patient   Methods Explanation   Comprehension Verbalized understanding             PT Long Term Goals -  12/05/16 1110      PT LONG TERM GOAL #1   Title Patient will demonstrate at least 150 degrees of active shoulder flexion to perform overhead activities   Baseline Unable - precautions    Time 16   Period Weeks   Status New     PT LONG TERM GOAL #2   Title Patient will be able to perform light lifting duties (10# or less) at work to complete work related duties with no increase in pain.    Baseline Unable - precautions    Time 16   Period Weeks   Status New     PT LONG TERM GOAL #3   Title Patient will report worst pain of no more than 2/10 in LUE to demonstrate improved tolerance for work related activities.    Baseline No pain currently, but multi-modal use of pain medications/interventions.    Time 16   Period Weeks   Status New     PT  LONG TERM GOAL #4   Title Patient will report Quick Dash score of less than 25% to demonstrate improved tolerance for ADLs.    Baseline Did not complete due to immobilization.    Time 16   Period Weeks   Status New               Plan - 12/13/16 1228    Clinical Impression Statement Patient is able to tolerate all aquatic movements (PROM and AAROM) this date with no complaints. Cuing to maintain in scapular plane for forward elevation with elbow bent, and for ER cuing to keep elbow at his side. Discussed weaning from sling at home per protocol, willl begin with 15-20 minutes at a time with progression to more full weaning next week.    Clinical Presentation Stable   Clinical Decision Making Moderate   Rehab Potential Good   Clinical Impairments Affecting Rehab Potential Significant repair, 3rd arthroscopic shoulder sx    PT Frequency 2x / week   PT Duration 12 weeks   PT Treatment/Interventions Aquatic Therapy;Moist Heat;Iontophoresis 4mg /ml Dexamethasone;Cryotherapy;Electrical Stimulation;Therapeutic exercise;Therapeutic activities;Neuromuscular re-education;Taping;Manual techniques;Dry needling;Biofeedback;Passive range of motion   PT Next Visit Plan Progress ROM and manual interventions per protocol    PT Home Exercise Plan Passive ER within designated guidelines, same for PROM flexion.    Consulted and Agree with Plan of Care Patient;Family member/caregiver   Family Member Consulted Spouse       Patient will benefit from skilled therapeutic intervention in order to improve the following deficits and impairments:  Pain, Improper body mechanics, Decreased range of motion, Decreased strength, Impaired UE functional use, Decreased activity tolerance, Decreased knowledge of precautions  Visit Diagnosis: Muscle weakness (generalized)  Chronic left shoulder pain     Problem List Patient Active Problem List   Diagnosis Date Noted  . Trochanteric bursitis, left hip 02/01/2016   . Pre-diabetes 08/05/2015  . Seasonal allergies 08/05/2015  . Elevated lipids 08/05/2015  . Chest tightness 06/11/2015  . Cutaneous skin tags 01/05/2015  . GERD without esophagitis 12/07/2014  . BPH (benign prostatic hyperplasia) 12/07/2014  . Diastolic CHF (HCC) 06/09/2014  . Shortness of breath 06/09/2013  . H/O mitral valve repair 06/03/2012  . Heart palpitations 06/03/2012  . Dizziness 06/03/2012   Alva Garnet PT, DPT, CSCS    12/13/2016, 12:32 PM  [place] [facility] PHYSICAL AND SPORTS MEDICINE [address], [place], [address] Phone: [phone]   Fax:  [phone]  Name: VIKAS WEGMANN MRN: [id number] Date  of Birth: 11/16/1955

## 2016-12-14 ENCOUNTER — Ambulatory Visit: Payer: 59 | Admitting: Physical Therapy

## 2016-12-18 ENCOUNTER — Ambulatory Visit: Payer: 59 | Admitting: Physical Therapy

## 2016-12-18 DIAGNOSIS — G8929 Other chronic pain: Secondary | ICD-10-CM | POA: Diagnosis not present

## 2016-12-18 DIAGNOSIS — M6281 Muscle weakness (generalized): Secondary | ICD-10-CM | POA: Diagnosis not present

## 2016-12-18 DIAGNOSIS — M25512 Pain in left shoulder: Secondary | ICD-10-CM | POA: Diagnosis not present

## 2016-12-19 NOTE — Therapy (Signed)
[facility] PH[facility]INE [address] [place], [address] Phone: [phone]   Fax:  [phone]  Physical Therapy Treatment  Patient Details  Name: Austin Stewart MRN: [id number] Date of Birth: 1955-12-19 Referring Provider: Dr. Austin Miles  Encounter Date: 12/18/2016      PT End of Session - 12/18/16 1647    Visit Number 12   Number of Visits 25   Date for PT Re-Evaluation 02/19/17   PT Start Time 1600   PT Stop Time 1625   PT Time Calculation (min) 25 min   Equipment Utilized During Treatment --  Shoulder sling   Activity Tolerance Patient tolerated treatment well   Behavior During Therapy [facility] for tasks assessed/performed      Past Medical History:  Diagnosis Date  . Allergic rhinitis, cause unspecified   . Allergy   . Cardiac dysrhythmia, unspecified   . Diverticulosis of colon (without mention of hemorrhage)   . Dysrhythmia   . Esophageal reflux   . Measles without mention of complication   . Mumps without mention of complication   . Osteoarthrosis, unspecified whether generalized or localized, unspecified site   . 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 Unspecified hyperplasia of prostate without urinary obstruction and other lower urinary tract symptoms (LUTS)     Past Surgical History:  Procedure Laterality Date  . CARDIAC CATHETERIZATION  2006   [facility]   . HERNIA REPAIR    . MITRAL VALVE REPAIR    . rotator cuff repair    . SHOULDER ARTHROSCOPY Left 08/24/2016   Procedure: ARTHROSCOPY SHOULDER with debridement;  Surgeon: Juanell Fairly, MD;  Location: [facility] ORS;  Service: Orthopedics;  Laterality: Left;  . TONSILLECTOMY      There were no vitals filed for this visit.      Subjective Assessment - 12/18/16 1642    Subjective Patient reports his shoulder felt fine after last aquatic session. [place] and had  some slight soreness from being up and walking for long periods (had his sling on and off intermittently).    Patient is accompained by: Family member   Pertinent History Previously failed rotator cuff repair from years ago, biceps tenodesis performed years ago in that procedure.    Limitations Lifting;House hold activities   Diagnostic tests Intra-operative findings indicative of split cuff tendon and full tear of infraspinatus and supraspinatus   Patient Stated Goals To return to full work duties.    Currently in Pain? No/denies      PROM in supine at roughly 20-30 degrees of abduction into external rotation with maximal range of ~40 degrees achieved before tightess/soft tissue end feel reported by patient. Performed x 12 minutes with positive response from patient, no pain reported.   PROM in supine into shoulder elevation with elbow bent and 3 points of contact provided with very slow oscillations, patient able to tolerate to roughly 105-110 degrees of elevation before very mild pain/tightness reported (provided rest breaks throughout) x 12 minutes                            PT Education - 12/18/16 1646    Education provided Yes   Education Details Call PT and let him know how MD check up goes.    Person(s) Educated Patient   Methods Explanation   Comprehension Verbalized  understanding             PT Long Term Goals - 12/05/16 1110      PT LONG TERM GOAL #1   Title Patient will demonstrate at least 150 degrees of active shoulder flexion to perform overhead activities   Baseline Unable - precautions    Time 16   Period Weeks   Status New     PT LONG TERM GOAL #2   Title Patient will be able to perform light lifting duties (10# or less) at work to complete work related duties with no increase in pain.    Baseline Unable - precautions    Time 16   Period Weeks   Status New     PT LONG TERM GOAL #3   Title Patient will report worst pain of no more  than 2/10 in LUE to demonstrate improved tolerance for work related activities.    Baseline No pain currently, but multi-modal use of pain medications/interventions.    Time 16   Period Weeks   Status New     PT LONG TERM GOAL #4   Title Patient will report Quick Dash score of less than 25% to demonstrate improved tolerance for ADLs.    Baseline Did not complete due to immobilization.    Time 16   Period Weeks   Status New               Plan - 12/18/16 1649    Clinical Impression Statement Patient continues to tolerate increasing ranges of passive motion before onset of tightness/pain. He has also had positive response and good tolerance for aquatic exercises, he is going in for his follow up with surgeon tomorrow.    Clinical Presentation Stable   Clinical Decision Making Moderate   Rehab Potential Good   Clinical Impairments Affecting Rehab Potential Significant repair, 3rd arthroscopic shoulder sx    PT Frequency 2x / week   PT Duration 12 weeks   PT Treatment/Interventions Aquatic Therapy;Moist Heat;Iontophoresis 4mg /ml Dexamethasone;Cryotherapy;Electrical Stimulation;Therapeutic exercise;Therapeutic activities;Neuromuscular re-education;Taping;Manual techniques;Dry needling;Biofeedback;Passive range of motion   PT Next Visit Plan Progress ROM and manual interventions per protocol    PT Home Exercise Plan Passive ER within designated guidelines, same for PROM flexion.    Consulted and Agree with Plan of Care Patient;Family member/caregiver   Family Member Consulted Spouse       Patient will benefit from skilled therapeutic intervention in order to improve the following deficits and impairments:  Pain, Improper body mechanics, Decreased range of motion, Decreased strength, Impaired UE functional use, Decreased activity tolerance, Decreased knowledge of precautions  Visit Diagnosis: Muscle weakness (generalized)  Chronic left shoulder pain     Problem List Patient  Active Problem List   Diagnosis Date Noted  . Trochanteric bursitis, left hip 02/01/2016  . Pre-diabetes 08/05/2015  . Seasonal allergies 08/05/2015  . Elevated lipids 08/05/2015  . Chest tightness 06/11/2015  . Cutaneous skin tags 01/05/2015  . GERD without esophagitis 12/07/2014  . BPH (benign prostatic hyperplasia) 12/07/2014  . Diastolic CHF (HCC) 06/09/2014  . Shortness of breath 06/09/2013  . H/O mitral valve repair 06/03/2012  . Heart palpitations 06/03/2012  . Dizziness 06/03/2012   Alva Garnet PT, DPT, CSCS    12/19/2016, 11:55 AM  [place] [facility] PHYSICAL AND SPORTS MEDICINE [address] [place], [address] Phone: [phone]   Fax:  [phone]  Name: Austin Stewart MRN: [id number] Date of Birth: Mar 13, 1956

## 2016-12-21 ENCOUNTER — Encounter: Payer: Self-pay | Admitting: Physical Therapy

## 2016-12-21 ENCOUNTER — Ambulatory Visit: Payer: 59 | Admitting: Physical Therapy

## 2016-12-21 DIAGNOSIS — G8929 Other chronic pain: Secondary | ICD-10-CM | POA: Diagnosis not present

## 2016-12-21 DIAGNOSIS — M6281 Muscle weakness (generalized): Secondary | ICD-10-CM | POA: Diagnosis not present

## 2016-12-21 DIAGNOSIS — Z760 Encounter for issue of repeat prescription: Secondary | ICD-10-CM | POA: Diagnosis not present

## 2016-12-21 DIAGNOSIS — M25512 Pain in left shoulder: Secondary | ICD-10-CM | POA: Diagnosis not present

## 2016-12-21 NOTE — Therapy (Signed)
[place] [facility] [address] [place], [address] Phone: [phone]   Fax:  [phone]  Physical Therapy Treatment  Patient Details  Name: Austin Stewart MRN: [id number] Date of Birth: 1956/02/13 Referring Provider: Dr. Ricardo Jericho  Encounter Date: 12/21/2016      PT End of Session - 12/21/16 1618    Visit Number 13   Number of Visits 25   Date for PT Re-Evaluation 02/19/17   PT Start Time 1618   PT Stop Time 1646   PT Time Calculation (min) 28 min   Equipment Utilized During Treatment --  Shoulder sling   Activity Tolerance Patient tolerated treatment well   Behavior During Therapy [facility] for tasks assessed/performed      Past Medical History:  Diagnosis Date  . Allergic rhinitis, cause unspecified   . Allergy   . Cardiac dysrhythmia, unspecified   . Diverticulosis of colon (without mention of hemorrhage)   . Dysrhythmia   . Esophageal reflux   . Measles without mention of complication   . Mumps without mention of complication   . Osteoarthrosis, unspecified whether generalized or localized, unspecified site   . 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 Unspecified hyperplasia of prostate without urinary obstruction and other lower urinary tract symptoms (LUTS)     Past Surgical History:  Procedure Laterality Date  . CARDIAC CATHETERIZATION  2006   [facility]   . HERNIA REPAIR    . MITRAL VALVE REPAIR    . rotator cuff repair    . SHOULDER ARTHROSCOPY Left 08/24/2016   Procedure: ARTHROSCOPY SHOULDER with debridement;  Surgeon: Thornton Park, MD;  Location: [facility] ORS;  Service: Orthopedics;  Laterality: Left;  . TONSILLECTOMY      There were no vitals filed for this visit.      Subjective Assessment - 12/21/16 1652    Subjective Pt reports he is doing well.  He met with his ortho MD yesterday who was pleased with his progress and would like for him to  continue with protocol.  No new complaints or concerns.   Patient is accompained by: Family member   Pertinent History Previously failed rotator cuff repair from years ago, biceps tenodesis performed years ago in that procedure.    Limitations Lifting;House hold activities   Diagnostic tests Intra-operative findings indicative of split cuff tendon and full tear of infraspinatus and supraspinatus   Patient Stated Goals To return to full work duties.    Currently in Pain? No/denies       TREATMENT   PROM in supine at roughly 20-30 degrees of abduction into external rotation with maximal range of ~40 degrees achieved before tightess/soft tissue end feel reported by patient. Performed x 14 minutes with positive response from patient, no pain reported.  ?  PROM in supine into shoulder elevation with elbow bent and 3 points of contact provided with very slow oscillations, patient able to tolerate to roughly 105-110 degrees of elevation before very mild pain/tightness reported (provided rest breaks throughout) x 14 minutes            PT Education - 12/21/16 1617    Education provided Yes   Education Details POC moving forward with protocol   Person(s) Educated Patient   Methods Explanation;Demonstration;Verbal cues   Comprehension Verbalized understanding;Returned demonstration;Verbal cues required;Need further instruction             PT Long Term Goals -  12/05/16 1110      PT LONG TERM GOAL #1   Title Patient will demonstrate at least 150 degrees of active shoulder flexion to perform overhead activities   Baseline Unable - precautions    Time 16   Period Weeks   Status New     PT LONG TERM GOAL #2   Title Patient will be able to perform light lifting duties (10# or less) at work to complete work related duties with no increase in pain.    Baseline Unable - precautions    Time 16   Period Weeks   Status New     PT LONG TERM GOAL #3   Title Patient will report worst  pain of no more than 2/10 in LUE to demonstrate improved tolerance for work related activities.    Baseline No pain currently, but multi-modal use of pain medications/interventions.    Time 16   Period Weeks   Status New     PT LONG TERM GOAL #4   Title Patient will report Quick Dash score of less than 25% to demonstrate improved tolerance for ADLs.    Baseline Did not complete due to immobilization.    Time 16   Period Weeks   Status New               Plan - 12/21/16 1653    Clinical Impression Statement Pt continues to progress well per protocol.  His MD reported that he was pleased with the pt's progress and to continue with protocol as provided.  Pt tolerated PROM well this date and will continue to benefit from PROM with progression to [facility] as appropriate per protocol.   Rehab Potential Good   Clinical Impairments Affecting Rehab Potential Significant repair, 3rd arthroscopic shoulder sx    PT Frequency 2x / week   PT Duration 12 weeks   PT Treatment/Interventions Aquatic Therapy;Moist Heat;Iontophoresis 66m/ml Dexamethasone;Cryotherapy;Electrical Stimulation;Therapeutic exercise;Therapeutic activities;Neuromuscular re-education;Taping;Manual techniques;Dry needling;Biofeedback;Passive range of motion   PT Next Visit Plan Progress ROM and manual interventions per protocol    PT Home Exercise Plan Passive ER within designated guidelines, same for PROM flexion.    Consulted and Agree with Plan of Care Patient;Family member/caregiver   Family Member Consulted Spouse       Patient will benefit from skilled therapeutic intervention in order to improve the following deficits and impairments:  Pain, Improper body mechanics, Decreased range of motion, Decreased strength, Impaired UE functional use, Decreased activity tolerance, Decreased knowledge of precautions  Visit Diagnosis: Muscle weakness (generalized)  Chronic left shoulder pain     Problem List Patient Active  Problem List   Diagnosis Date Noted  . Trochanteric bursitis, left hip 02/01/2016  . Pre-diabetes 08/05/2015  . Seasonal allergies 08/05/2015  . Elevated lipids 08/05/2015  . Chest tightness 06/11/2015  . Cutaneous skin tags 01/05/2015  . GERD without esophagitis 12/07/2014  . BPH (benign prostatic hyperplasia) 12/07/2014  . Diastolic CHF ([place] 075/01/2584 . Shortness of breath 06/09/2013  . H/O mitral valve repair 06/03/2012  . Heart palpitations 06/03/2012  . Dizziness 06/03/2012    ACollie SiadPT, DPT 12/21/2016, 4:55 PM  [facility] [address] [place] [address]Phone: [phone]  Fax:  [phone] Name: Austin BADIEMRN: [id number] of Birth: [birth year]

## 2016-12-25 ENCOUNTER — Ambulatory Visit: Payer: 59

## 2016-12-25 DIAGNOSIS — M25512 Pain in left shoulder: Secondary | ICD-10-CM | POA: Diagnosis not present

## 2016-12-25 DIAGNOSIS — G8929 Other chronic pain: Secondary | ICD-10-CM

## 2016-12-25 DIAGNOSIS — M6281 Muscle weakness (generalized): Secondary | ICD-10-CM | POA: Diagnosis not present

## 2016-12-25 NOTE — Therapy (Signed)
[place] [facility] [address] [place], [address] Phone: [phone]   Fax:  [phone]  Physical Therapy Treatment  Patient Details  Name: Austin Stewart MRN: [id number] Date of Birth: January 21, 1956 Referring Provider: Dr. Austin Miles  Encounter Date: 12/25/2016      PT End of Session - 12/25/16 1619    Visit Number 14   Number of Visits 25   Date for PT Re-Evaluation 02/19/17   PT Start Time 1620   PT Stop Time 1700   PT Time Calculation (min) 40 min   Equipment Utilized During Treatment --  Shoulder sling   Activity Tolerance Patient tolerated treatment well   Behavior During Therapy [facility] for tasks assessed/performed      Past Medical History:  Diagnosis Date  . Allergic rhinitis, cause unspecified   . Allergy   . Cardiac dysrhythmia, unspecified   . Diverticulosis of colon (without mention of hemorrhage)   . Dysrhythmia   . Esophageal reflux   . Measles without mention of complication   . Mumps without mention of complication   . Osteoarthrosis, unspecified whether generalized or localized, unspecified site   . 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 Unspecified hyperplasia of prostate without urinary obstruction and other lower urinary tract symptoms (LUTS)     Past Surgical History:  Procedure Laterality Date  . CARDIAC CATHETERIZATION  2006   [facility]   . HERNIA REPAIR    . MITRAL VALVE REPAIR    . rotator cuff repair    . SHOULDER ARTHROSCOPY Left 08/24/2016   Procedure: ARTHROSCOPY SHOULDER with debridement;  Surgeon: Juanell Fairly, MD;  Location: [facility] ORS;  Service: Orthopedics;  Laterality: Left;  . TONSILLECTOMY      There were no vitals filed for this visit.      Subjective Assessment - 12/25/16 1618    Subjective Pt reports that he is doing well at this time. He has initiated some PROM L shoulder flexion utilized RUE as instructed by MD as  well as canes for supine ER PROM. No pain upon arrival and no specific questions or concerns.    Patient is accompained by: Family member   Pertinent History Previously failed rotator cuff repair from years ago, biceps tenodesis performed years ago in that procedure.    Limitations Lifting;House hold activities   Diagnostic tests Intra-operative findings indicative of split cuff tendon and full tear of infraspinatus and supraspinatus   Patient Stated Goals To return to full work duties.    Currently in Pain? No/denies            TREATMENT   PROM in supine at neutral as well as at roughly 20-30 degrees of abduction into L shoulder external rotation with maximal range of approximately 40 degrees achieved before tightess/soft tissue end feel reported by patient. Performed x 14 minutes with positive response from patient, no pain reported.   PROM in supine into L shoulder flexion with elbow bent and with very slow oscillations, patient able to tolerate to roughly 105-110 degrees of elevation before very mild pain/tightness reported (provided rest breaks throughout) x 14 minutes;  Pt able to demonstrate safe performance of L shoulder flexion PROM utilizing RUE in supine x 5 repetitions. Reinforced HEP with patient including ROM restrictions;                         PT Education -  12/25/16 1619    Education provided Yes   Education Details plan of care, Reinforced PROM flexion and ER HEP   Person(s) Educated Patient   Methods Explanation   Comprehension Verbalized understanding             PT Long Term Goals - 12/05/16 1110      PT LONG TERM GOAL #1   Title Patient will demonstrate at least 150 degrees of active shoulder flexion to perform overhead activities   Baseline Unable - precautions    Time 16   Period Weeks   Status New     PT LONG TERM GOAL #2   Title Patient will be able to perform light lifting duties (10# or less) at work to complete work  related duties with no increase in pain.    Baseline Unable - precautions    Time 16   Period Weeks   Status New     PT LONG TERM GOAL #3   Title Patient will report worst pain of no more than 2/10 in LUE to demonstrate improved tolerance for work related activities.    Baseline No pain currently, but multi-modal use of pain medications/interventions.    Time 16   Period Weeks   Status New     PT LONG TERM GOAL #4   Title Patient will report Quick Dash score of less than 25% to demonstrate improved tolerance for ADLs.    Baseline Did not complete due to immobilization.    Time 16   Period Weeks   Status New               Plan - 12/25/16 1619    Clinical Impression Statement Pt will be at the end of his 7th week tomorrow. He continues to demonstrates good progression per protocol. He demonstrates safe PROM L shoulder flexion HEP technique as instructed by his MD at his last appointment. Reminded pt not to exceed 125 flexion and 45 degrees ER as outline in protocol. Therapist will continue PROM for the remainder of this week and initiate phase 2 of the protocol after pt has completed his eigth week. Pt encouraged to continue HEP and follow-up as scheduled.    Clinical Presentation Stable   Clinical Decision Making Moderate   Rehab Potential Good   Clinical Impairments Affecting Rehab Potential Significant repair, 3rd arthroscopic shoulder sx    PT Frequency 2x / week   PT Duration 12 weeks   PT Treatment/Interventions Aquatic Therapy;Moist Heat;Iontophoresis 4mg /ml Dexamethasone;Cryotherapy;Electrical Stimulation;Therapeutic exercise;Therapeutic activities;Neuromuscular re-education;Taping;Manual techniques;Dry needling;Biofeedback;Passive range of motion   PT Next Visit Plan Progress ROM and manual interventions per protocol    PT Home Exercise Plan Passive ER within designated guidelines, same for PROM flexion.    Consulted and Agree with Plan of Care Patient;Family  member/caregiver   Family Member Consulted Spouse       Patient will benefit from skilled therapeutic intervention in order to improve the following deficits and impairments:  Pain, Improper body mechanics, Decreased range of motion, Decreased strength, Impaired UE functional use, Decreased activity tolerance, Decreased knowledge of precautions  Visit Diagnosis: Muscle weakness (generalized)  Chronic left shoulder pain     Problem List Patient Active Problem List   Diagnosis Date Noted  . Trochanteric bursitis, left hip 02/01/2016  . Pre-diabetes 08/05/2015  . Seasonal allergies 08/05/2015  . Elevated lipids 08/05/2015  . Chest tightness 06/11/2015  . Cutaneous skin tags 01/05/2015  . GERD without esophagitis 12/07/2014  . BPH (benign prostatic hyperplasia)  12/07/2014  . Diastolic CHF (HCC) 06/09/2014  . Shortness of breath 06/09/2013  . H/O mitral valve repair 06/03/2012  . Heart palpitations 06/03/2012  . Dizziness 06/03/2012   Lynnea Maizes PT, DPT   Huprich,Jason 12/25/2016, 5:18 PM  [place] [facility] [address], [place], [address] Phone: [phone]   Fax:  [phone]  Name: Austin Stewart MRN: [id number] Date of Birth: Aug 02, 1955

## 2016-12-28 ENCOUNTER — Ambulatory Visit: Payer: 59

## 2016-12-28 DIAGNOSIS — G8929 Other chronic pain: Secondary | ICD-10-CM

## 2016-12-28 DIAGNOSIS — M6281 Muscle weakness (generalized): Secondary | ICD-10-CM | POA: Diagnosis not present

## 2016-12-28 DIAGNOSIS — M25512 Pain in left shoulder: Secondary | ICD-10-CM

## 2016-12-28 NOTE — Therapy (Signed)
[place] [facility] [address] [place], [address] Phone: [phone]   Fax:  [phone]  Physical Therapy Treatment  Patient Details  Name: Austin Stewart MRN: [id number] Date of Birth: 09-30-1955 Referring Provider: Dr. Austin Stewart  Encounter Date: 12/28/2016      PT End of Session - 12/28/16 1704    Visit Number 15   Number of Visits 25   Date for PT Re-Evaluation 02/19/17   PT Start Time 1620   PT Stop Time 1650   PT Time Calculation (min) 30 min   Equipment Utilized During Treatment --  Shoulder sling   Activity Tolerance Patient tolerated treatment well   Behavior During Therapy [facility] for tasks assessed/performed      Past Medical History:  Diagnosis Date  . Allergic rhinitis, cause unspecified   . Allergy   . Cardiac dysrhythmia, unspecified   . Diverticulosis of colon (without mention of hemorrhage)   . Dysrhythmia   . Esophageal reflux   . Measles without mention of complication   . Mumps without mention of complication   . Osteoarthrosis, unspecified whether generalized or localized, unspecified site   . 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 Unspecified hyperplasia of prostate without urinary obstruction and other lower urinary tract symptoms (LUTS)     Past Surgical History:  Procedure Laterality Date  . CARDIAC CATHETERIZATION  2006   [facility]   . HERNIA REPAIR    . MITRAL VALVE REPAIR    . rotator cuff repair    . SHOULDER ARTHROSCOPY Left 08/24/2016   Procedure: ARTHROSCOPY SHOULDER with debridement;  Surgeon: Juanell Fairly, MD;  Location: [facility] ORS;  Service: Orthopedics;  Laterality: Left;  . TONSILLECTOMY      There were no vitals filed for this visit.      Subjective Assessment - 12/28/16 1620    Subjective Pt reports he is doing well on this date. He has continued with his HEP without issue. Denies shoulder pain today. No specific  questions or concerns at this time.    Patient is accompained by: Family member   Pertinent History Previously failed rotator cuff repair from years ago, biceps tenodesis performed years ago in that procedure.    Limitations Lifting;House hold activities   Diagnostic tests Intra-operative findings indicative of split cuff tendon and full tear of infraspinatus and supraspinatus   Patient Stated Goals To return to full work duties.    Currently in Pain? No/denies           TREATMENT   Ther-ex  PROM in supine at neutral as well as at roughly 20-30 degrees of abduction into L shoulder external rotation with maximal range of approximately 40 degrees achieved before tightness/soft tissue end feel reported by patient. Performed x 15 minutes with positive response from patient, no pain reported.   PROM in supine into L shoulder flexion with elbow bent and with very slow oscillations, patient able to tolerate to roughly 105-110 degrees of elevation before very mild pain/tightness reported (provided rest breaks throughout) x 15 minutes;                          PT Education - 12/28/16 1620    Education provided Yes   Education Details plan of care   Person(s) Educated Patient   Methods Explanation   Comprehension Verbalized understanding  PT Long Term Goals - 12/05/16 1110      PT LONG TERM GOAL #1   Title Patient will demonstrate at least 150 degrees of active shoulder flexion to perform overhead activities   Baseline Unable - precautions    Time 16   Period Weeks   Status New     PT LONG TERM GOAL #2   Title Patient will be able to perform light lifting duties (10# or less) at work to complete work related duties with no increase in pain.    Baseline Unable - precautions    Time 16   Period Weeks   Status New     PT LONG TERM GOAL #3   Title Patient will report worst pain of no more than 2/10 in LUE to demonstrate improved tolerance for  work related activities.    Baseline No pain currently, but multi-modal use of pain medications/interventions.    Time 16   Period Weeks   Status New     PT LONG TERM GOAL #4   Title Patient will report Quick Dash score of less than 25% to demonstrate improved tolerance for ADLs.    Baseline Did not complete due to immobilization.    Time 16   Period Weeks   Status New               Plan - 12/28/16 1705    Clinical Impression Statement Pt is not into his eigth week of his rehab protocol. He continues to demonstrate good progression according to parameters of protocol. Therapist will initiate phase 2 of patient's protocol following the end of his eighth week. Pt encouraged to continue HEP and follow-up as scheduled.    Clinical Presentation Stable   Clinical Decision Making Moderate   Rehab Potential Good   Clinical Impairments Affecting Rehab Potential Significant repair, 3rd arthroscopic shoulder sx    PT Frequency 2x / week   PT Duration 12 weeks   PT Treatment/Interventions Aquatic Therapy;Moist Heat;Iontophoresis 4mg /ml Dexamethasone;Cryotherapy;Electrical Stimulation;Therapeutic exercise;Therapeutic activities;Neuromuscular re-education;Taping;Manual techniques;Dry needling;Biofeedback;Passive range of motion   PT Next Visit Plan Progress ROM and manual interventions per protocol    PT Home Exercise Plan Passive ER within designated guidelines, same for PROM flexion.    Consulted and Agree with Plan of Care Patient;Family member/caregiver   Family Member Consulted Spouse       Patient will benefit from skilled therapeutic intervention in order to improve the following deficits and impairments:  Pain, Improper body mechanics, Decreased range of motion, Decreased strength, Impaired UE functional use, Decreased activity tolerance, Decreased knowledge of precautions  Visit Diagnosis: Muscle weakness (generalized)  Chronic left shoulder pain     Problem List Patient  Active Problem List   Diagnosis Date Noted  . Trochanteric bursitis, left hip 02/01/2016  . Pre-diabetes 08/05/2015  . Seasonal allergies 08/05/2015  . Elevated lipids 08/05/2015  . Chest tightness 06/11/2015  . Cutaneous skin tags 01/05/2015  . GERD without esophagitis 12/07/2014  . BPH (benign prostatic hyperplasia) 12/07/2014  . Diastolic CHF (HCC) 06/09/2014  . Shortness of breath 06/09/2013  . H/O mitral valve repair 06/03/2012  . Heart palpitations 06/03/2012  . Dizziness 06/03/2012   Lynnea MaizesJason D Grisel Blumenstock PT, DPT   Cosima Prentiss 12/28/2016, 5:07 PM  [place] [facility] [address] [place], [place], [address] Phone: [phone]   Fax:  [phone]  Name: Austin Stewart MRN: [id number] Date of Birth: 04/23/1956

## 2017-01-02 ENCOUNTER — Ambulatory Visit: Payer: 59 | Attending: Orthopaedic Surgery | Admitting: Physical Therapy

## 2017-01-02 DIAGNOSIS — G8929 Other chronic pain: Secondary | ICD-10-CM | POA: Insufficient documentation

## 2017-01-02 DIAGNOSIS — M25512 Pain in left shoulder: Secondary | ICD-10-CM | POA: Insufficient documentation

## 2017-01-02 DIAGNOSIS — M6281 Muscle weakness (generalized): Secondary | ICD-10-CM | POA: Insufficient documentation

## 2017-01-02 NOTE — Patient Instructions (Addendum)
AAROM with contralteral UE and arm bent in supine   AAROM with dowel/PVC and arm bent in supine   ER then IR isometrics at 0 degrees abduction in supine (submaximal) x 8 for 2 sets   Holding arm in 90 degrees of flexion statically with elbow extended (felt heavy) x 15" (added in scap punches with AAROM on second set, began to experience mild pain after 4, thus dc'd)  Sidelying isometric scapular protraction x 8 through sub max contraction progressed through ROM  Seated AAROM into ER with PVC pipe through available range (pain free, just stiff) x 12 for 2 sets

## 2017-01-03 NOTE — Therapy (Signed)
[place] [facility] [address] [place], [address] Phone: [phone]   Fax:  [phone]  Physical Therapy Treatment  Patient Details  Name: Austin Stewart MRN: [id number] Date of Birth: 1956/03/21 Referring Provider: Dr. Austin Miles  Encounter Date: 01/02/2017      PT End of Session - 01/02/17 1645    Visit Number 16   Number of Visits 25   Date for PT Re-Evaluation 02/19/17   PT Start Time 1620   PT Stop Time 1700   PT Time Calculation (min) 40 min   Equipment Utilized During Treatment --   Activity Tolerance Patient tolerated treatment well   Behavior During Therapy [facility] for tasks assessed/performed      Past Medical History:  Diagnosis Date  . Allergic rhinitis, cause unspecified   . Allergy   . Cardiac dysrhythmia, unspecified   . Diverticulosis of colon (without mention of hemorrhage)   . Dysrhythmia   . Esophageal reflux   . Measles without mention of complication   . Mumps without mention of complication   . Osteoarthrosis, unspecified whether generalized or localized, unspecified site   . 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 Unspecified hyperplasia of prostate without urinary obstruction and other lower urinary tract symptoms (LUTS)     Past Surgical History:  Procedure Laterality Date  . CARDIAC CATHETERIZATION  2006   [facility]   . HERNIA REPAIR    . MITRAL VALVE REPAIR    . rotator cuff repair    . SHOULDER ARTHROSCOPY Left 08/24/2016   Procedure: ARTHROSCOPY SHOULDER with debridement;  Surgeon: Juanell Fairly, MD;  Location: [facility] ORS;  Service: Orthopedics;  Laterality: Left;  . TONSILLECTOMY      There were no vitals filed for this visit.      Subjective Assessment - 01/02/17 1621    Subjective Patient reports he has been doing well, no pain or discomfort aside from while walking without the sling on though this is improving. He reports  he can already feel his L arm is getting less heavy as he has been out of the sling for some time now.    Patient is accompained by: Family member   Pertinent History Previously failed rotator cuff repair from years ago, biceps tenodesis performed years ago in that procedure.    Limitations Lifting;House hold activities   Diagnostic tests Intra-operative findings indicative of split cuff tendon and full tear of infraspinatus and supraspinatus   Patient Stated Goals To return to full work duties.    Currently in Pain? No/denies      AAROM with contralteral UE and arm bent in supine through pain free tolerated rangex10repetitions for 2 sets   AAROM with dowel/PVC and arm bent in supine into ERthrough pain free range x 10 repetitions for 2 sets   ER then IR isometrics at 0 degrees abduction in supine (submaximal) x 8 for 2 sets   Holding arm in 90 degrees of flexion statically with elbow extended (felt heavy) x 15" (added in scap punches with AAROM on second set, began to experience mild pain after 4, thus dc'd) PT assisted with holding arm in this position to offload.   Sidelying isometric scapular protraction x 8 through sub max contraction progressed through ROM x2 setsperformed 2 sets of retraction as well x8 through pain free ROM  Seated AAROM into ER with PVC pipe through available range (pain free, just stiff)  x 12 for 2 sets                           PT Education - 01/02/17 1645    Education provided Yes   Education Details Provided progression of exercises, strict instruction for no use above shoulder level yet, nothing heavier than coffee cup at waist level.    Person(s) Educated Patient   Methods Explanation;Demonstration;Handout   Comprehension Verbalized understanding;Returned demonstration             PT Long Term Goals - 12/05/16 1110      PT LONG TERM GOAL #1   Title Patient will demonstrate at least 150 degrees of active shoulder flexion to  perform overhead activities   Baseline Unable - precautions    Time 16   Period Weeks   Status New     PT LONG TERM GOAL #2   Title Patient will be able to perform light lifting duties (10# or less) at work to complete work related duties with no increase in pain.    Baseline Unable - precautions    Time 16   Period Weeks   Status New     PT LONG TERM GOAL #3   Title Patient will report worst pain of no more than 2/10 in LUE to demonstrate improved tolerance for work related activities.    Baseline No pain currently, but multi-modal use of pain medications/interventions.    Time 16   Period Weeks   Status New     PT LONG TERM GOAL #4   Title Patient will report Quick Dash score of less than 25% to demonstrate improved tolerance for ADLs.    Baseline Did not complete due to immobilization.    Time 16   Period Weeks   Status New               Plan - 01/02/17 1648    Clinical Impression Statement Patient is able to participate in phase 2 initiation this date of exercises (more active based). He does have slight pain at times, however this was corrected with decreased resistance/difficulty or range of motion. He appeared to tolerate well today, and will be progressed as tolerated.    Clinical Presentation Stable   Clinical Decision Making Moderate   Rehab Potential Good   Clinical Impairments Affecting Rehab Potential Significant repair, 3rd arthroscopic shoulder sx    PT Frequency 2x / week   PT Duration 12 weeks   PT Treatment/Interventions Aquatic Therapy;Moist Heat;Iontophoresis 4mg /ml Dexamethasone;Cryotherapy;Electrical Stimulation;Therapeutic exercise;Therapeutic activities;Neuromuscular re-education;Taping;Manual techniques;Dry needling;Biofeedback;Passive range of motion   PT Next Visit Plan Progress ROM and manual interventions per protocol    PT Home Exercise Plan Passive ER within designated guidelines, same for PROM flexion.    Consulted and Agree with Plan of  Care Patient;Family member/caregiver   Family Member Consulted Spouse       Patient will benefit from skilled therapeutic intervention in order to improve the following deficits and impairments:  Pain, Improper body mechanics, Decreased range of motion, Decreased strength, Impaired UE functional use, Decreased activity tolerance, Decreased knowledge of precautions  Visit Diagnosis: Muscle weakness (generalized)  Chronic left shoulder pain     Problem List Patient Active Problem List   Diagnosis Date Noted  . Trochanteric bursitis, left hip 02/01/2016  . Pre-diabetes 08/05/2015  . Seasonal allergies 08/05/2015  . Elevated lipids 08/05/2015  . Chest tightness 06/11/2015  . Cutaneous skin tags 01/05/2015  . GERD  without esophagitis 12/07/2014  . BPH (benign prostatic hyperplasia) 12/07/2014  . Diastolic CHF (HCC) 06/09/2014  . Shortness of breath 06/09/2013  . H/O mitral valve repair 06/03/2012  . Heart palpitations 06/03/2012  . Dizziness 06/03/2012   Alva GarnetPatrick Fredrico Beedle PT, DPT, CSCS    01/03/2017, 10:59 AM  [place] [facility] [address] [place], [place], [address] Phone: [phone]   Fax:  [phone]  Name: Austin Stewart MRN: [id number] Date of Birth: 01/09/1956

## 2017-01-04 ENCOUNTER — Ambulatory Visit: Payer: 59 | Admitting: Physical Therapy

## 2017-01-04 DIAGNOSIS — M6281 Muscle weakness (generalized): Secondary | ICD-10-CM

## 2017-01-04 DIAGNOSIS — G8929 Other chronic pain: Secondary | ICD-10-CM

## 2017-01-04 DIAGNOSIS — M25512 Pain in left shoulder: Secondary | ICD-10-CM

## 2017-01-04 NOTE — Patient Instructions (Addendum)
Holding in flexion isometrically with elbow straight at 90 degrees  PROM at 45, 60 degrees ER   AAROM flexion   PROM cross body horizontal adduction   Isometric ER/IR   Yellow t-band x 10 through submax force provided x 10 for 2 sets in sitting   Scap punches x 12   Rhythmic stab at 90 degrees of forward elevation   Initially with elbows bent and PVC pipe to 123 degrees in supine with several repetitions was able to get to 136

## 2017-01-04 NOTE — Therapy (Signed)
[place] [facility] [address], [place], [address] Phone: [phone]   Fax:  [phone]  Physical Therapy Treatment  Patient Details  Name: Austin Stewart MRN: [id number] Date of Birth: [birth year] Referring Provider: Dr. Austin Miles  Encounter Date: 01/04/2017      PT End of Session - 01/06/17 2119    Visit Number 17   Number of Visits 25   Date for PT Re-Evaluation 02/19/17   PT Start Time 1627   PT Stop Time 1706   PT Time Calculation (min) 39 min   Activity Tolerance Patient tolerated treatment well   Behavior During Therapy [facility] for tasks assessed/performed      Past Medical History:  Diagnosis Date  . Allergic rhinitis, cause unspecified   . Allergy   . Cardiac dysrhythmia, unspecified   . Diverticulosis of colon (without mention of hemorrhage)   . Dysrhythmia   . Esophageal reflux   . Measles without mention of complication   . Mumps without mention of complication   . Osteoarthrosis, unspecified whether generalized or localized, unspecified site   . 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 Unspecified hyperplasia of prostate without urinary obstruction and other lower urinary tract symptoms (LUTS)     Past Surgical History:  Procedure Laterality Date  . CARDIAC CATHETERIZATION  2006   [facility]   . HERNIA REPAIR    . MITRAL VALVE REPAIR    . rotator cuff repair    . SHOULDER ARTHROSCOPY Left 08/24/2016   Procedure: ARTHROSCOPY SHOULDER with debridement;  Surgeon: Juanell Fairly, MD;  Location: [facility] ORS;  Service: Orthopedics;  Laterality: Left;  . TONSILLECTOMY      There were no vitals filed for this visit.      Subjective Assessment - 01/06/17 2118    Subjective Patient reports he tolerated progression to phase 2 exercises well with mild increase in soreness. but hasn't had any pain today. 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    Patient is accompained by: Family member   Pertinent History Previously failed rotator cuff repair from years ago, biceps tenodesis performed years ago in that procedure.    Limitations Lifting;House hold activities   Diagnostic tests Intra-operative findings indicative of split cuff tendon and full tear of infraspinatus and supraspinatus   Patient Stated Goals To return to full work duties.    Currently in Pain? No/denies      Holding in flexion isometrically with elbow straight at 90 degrees x 15" x 5 repetitions   PROM at 45, 60 degrees ER provided by therapist x 2 minutes with oscillations at end range of tolerated motion.   AAROM flexion x 12 repetitions x 2 sets in supine  PROM cross body horizontal adduction to tolerance (no increase in pain reported) x 6 repetitions   Isometric ER/IR submaximal x 10 repetitions x 2 sets for 3" holds at 0 degrees abduction   Yellow t-band x 10 through submax force provided x 10 for 2 sets in sitting   Scap punches x 12 with PT supporting at 90 degrees of forward elevation   Rhythmic stab at 90 degrees of forward elevation at submaximal contractions  Initially with elbows bent and PVC pipe to 123 degrees in supine with several repetitions was able to get to 136  PT Education - 01/06/17 2117    Education provided Yes   Education Details Goal is to get to 150 degrees of AAROM in supine by next week.    Person(s) Educated Patient   Methods Explanation;Demonstration;Handout   Comprehension Returned demonstration;Verbalized understanding             PT Long Term Goals - 12/05/16 1110      PT LONG TERM GOAL #1   Title Patient will demonstrate at least 150 degrees of active shoulder flexion to perform overhead activities   Baseline Unable - precautions    Time 16   Period Weeks   Status New     PT LONG TERM GOAL #2   Title Patient will be able to perform light  lifting duties (10# or less) at work to complete work related duties with no increase in pain.    Baseline Unable - precautions    Time 16   Period Weeks   Status New     PT LONG TERM GOAL #3   Title Patient will report worst pain of no more than 2/10 in LUE to demonstrate improved tolerance for work related activities.    Baseline No pain currently, but multi-modal use of pain medications/interventions.    Time 16   Period Weeks   Status New     PT LONG TERM GOAL #4   Title Patient will report Quick Dash score of less than 25% to demonstrate improved tolerance for ADLs.    Baseline Did not complete due to immobilization.    Time 16   Period Weeks   Status New               Plan - 01/06/17 2120    Clinical Impression Statement Patient tolerated initiation of phase 2 exercises in previous session. He is increasing his AAROM in supine well and PROM into ER as well as tolerating gentle introduction to active resisted ER movements. He continues to have low pain levels and is increasing strength (he is able to hold his arm with elbow extended and resist rhythmic stabilization provided by therapist. He will continue to be progressed in ROM as indicated.    Clinical Presentation Stable   Clinical Decision Making Moderate   Rehab Potential Good   Clinical Impairments Affecting Rehab Potential Significant repair, 3rd arthroscopic shoulder sx    PT Frequency 2x / week   PT Duration 12 weeks   PT Treatment/Interventions Aquatic Therapy;Moist Heat;Iontophoresis /ml Dexamethasone;Cryotherapy;Electrical Stimulation;Therapeutic exercise;Therapeutic activities;Neuromuscular re-education;Taping;Manual techniques;Dry needling;Biofeedback;Passive range of motion   PT Next Visit Plan Progress ROM and manual interventions per protocol    PT Home Exercise Plan Passive ER within designated guidelines, same for PROM flexion.    Consulted and Agree with Plan of Care Patient      Patient will  benefit from skilled therapeutic intervention in order to improve the following deficits and impairments:  Pain, Improper body mechanics, Decreased range of motion, Decreased strength, Impaired UE functional use, Decreased activity tolerance, Decreased knowledge of precautions  Visit Diagnosis: Muscle weakness (generalized)  Chronic left shoulder pain     Problem List Patient Active Problem List   Diagnosis Date Noted  . Trochanteric bursitis, left hip 02/01/2016  . Pre-diabetes 08/05/2015  . Seasonal allergies 08/05/2015  . Elevated lipids 08/05/2015  . Chest tightness 06/11/2015  . Cutaneous skin tags 01/05/2015  . GERD without esophagitis 12/07/2014  . BPH (benign prostatic hyperplasia) 12/07/2014  . Diastolic CHF (HCC) 06/09/2014  . Shortness of breath  06/09/2013  . H/O mitral valve repair 06/03/2012  . Heart palpitations 06/03/2012  . Dizziness 06/03/2012   Alva GarnetPatrick Maci Eickholt PT, DPT, CSCS    01/06/2017, 9:20 PM  [place] [facility] [address] [place], [place], [address] Phone: [phone]   Fax:  [phone]  Name: Austin Stewart MRN: [id number] Date of Birth: 01/07/1956

## 2017-01-08 ENCOUNTER — Ambulatory Visit: Payer: 59 | Admitting: Physical Therapy

## 2017-01-08 DIAGNOSIS — M25512 Pain in left shoulder: Secondary | ICD-10-CM | POA: Diagnosis not present

## 2017-01-08 DIAGNOSIS — G8929 Other chronic pain: Secondary | ICD-10-CM | POA: Diagnosis not present

## 2017-01-08 DIAGNOSIS — M6281 Muscle weakness (generalized): Secondary | ICD-10-CM | POA: Diagnosis not present

## 2017-01-08 NOTE — Patient Instructions (Addendum)
PROM ER to 67 degrees after several rounds of gentle overpressure   AAROM in supine to 130 degrees with no pain x 12 repetitions   AROM to roughly 80 degrees in supine with therapist supporting x 10 repetitions for 3 sets   AAROM with PVC pipe through full tolerated ROM to 150 degrees max x 8 repetitions for 2 sets on second able to get to 157 degrees without pain  Progressed to initial resistance in motion phase   On wedge performed AAROM with PVC pipe and 1# ankle weight to 90 degrees x8 repetitions x 3 sets   On wedge AROM with PT guarding and elbow bent through low ROM x 7 through 70-80 degrees (beginning to fatigue)

## 2017-01-09 NOTE — Therapy (Signed)
[place] [facility] [address], [place], [address] Phone: [phone]   Fax:  [phone]  Physical Therapy Treatment  Patient Details  Name: Austin Stewart MRN: [id number] Date of Birth: 1955/07/31 Referring Provider: Dr. Ricardo Jericho  Encounter Date: 01/08/2017      PT End of Session - 01/08/17 1652    Visit Number 18   Number of Visits 25   Date for PT Re-Evaluation 02/19/17   PT Start Time 1623   PT Stop Time 1701   PT Time Calculation (min) 38 min   Activity Tolerance Patient tolerated treatment well   Behavior During Therapy [facility] for tasks assessed/performed      Past Medical History:  Diagnosis Date  . Allergic rhinitis, cause unspecified   . Allergy   . Cardiac dysrhythmia, unspecified   . Diverticulosis of colon (without mention of hemorrhage)   . Dysrhythmia   . Esophageal reflux   . Measles without mention of complication   . Mumps without mention of complication   . Osteoarthrosis, unspecified whether generalized or localized, unspecified site   . 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 Unspecified hyperplasia of prostate without urinary obstruction and other lower urinary tract symptoms (LUTS)     Past Surgical History:  Procedure Laterality Date  . CARDIAC CATHETERIZATION  2006   [facility]   . HERNIA REPAIR    . MITRAL VALVE REPAIR    . rotator cuff repair    . SHOULDER ARTHROSCOPY Left 08/24/2016   Procedure: ARTHROSCOPY SHOULDER with debridement;  Surgeon: Thornton Park, MD;  Location: [facility] ORS;  Service: Orthopedics;  Laterality: Left;  . TONSILLECTOMY      There were no vitals filed for this visit.      Subjective Assessment - 01/08/17 1624    Subjective Patient reports he had some soreness in lateral arm after his last PT session, but this has dissipated. He is happy with his progress thus far.    Patient is accompained by: Family  member   Pertinent History Previously failed rotator cuff repair from years ago, biceps tenodesis performed years ago in that procedure.    Limitations Lifting;House hold activities   Diagnostic tests Intra-operative findings indicative of split cuff tendon and full tear of infraspinatus and supraspinatus   Patient Stated Goals To return to full work duties.    Currently in Pain? No/denies      PROM ER to 67 degrees after several rounds of gentle overpressure   AAROM in supine to 130 degrees with no pain x 12 repetitions   AROM to roughly 80 degrees in supine with therapist supporting x 10 repetitions for 3 sets   AAROM with PVC pipe through full tolerated ROM to 150 degrees max x 8 repetitions for 2 sets on second able to get to 157 degrees without pain  Progressed to initial resistance in motion phase   On wedge performed AAROM with PVC pipe and 1# ankle weight to 90 degrees x8 repetitions x 3 sets   On wedge AROM with PT guarding and elbow bent through low ROM x 7 through 70-80 degrees (beginning to fatigue)                           PT Education - 01/08/17 1652    Education provided Yes   Education Details Continue to work on ROM at home, though  will begin to progress to more upright positions for added strengthening.    Person(s) Educated Patient   Methods Explanation;Demonstration   Comprehension Verbalized understanding;Returned demonstration             PT Long Term Goals - 12/05/16 1110      PT LONG TERM GOAL #1   Title Patient will demonstrate at least 150 degrees of active shoulder flexion to perform overhead activities   Baseline Unable - precautions    Time 16   Period Weeks   Status New     PT LONG TERM GOAL #2   Title Patient will be able to perform light lifting duties (10# or less) at work to complete work related duties with no increase in pain.    Baseline Unable - precautions    Time 16   Period Weeks   Status New     PT  LONG TERM GOAL #3   Title Patient will report worst pain of no more than 2/10 in LUE to demonstrate improved tolerance for work related activities.    Baseline No pain currently, but multi-modal use of pain medications/interventions.    Time 16   Period Weeks   Status New     PT LONG TERM GOAL #4   Title Patient will report Quick Dash score of less than 25% to demonstrate improved tolerance for ADLs.    Baseline Did not complete due to immobilization.    Time 16   Period Weeks   Status New               Plan - 01/08/17 1653    Clinical Impression Statement Patient continues to demonstrate improving AROM/AAROM and has met ROM goals in supine into ER and flexion. His biggest limitation right now is weakness in his LUE, though this is markedly improving session by session. He is progressing to 45 degree incline this date, though notable for inability to complete full ROM as in supine.    Clinical Presentation Stable   Clinical Decision Making Moderate   Rehab Potential Good   Clinical Impairments Affecting Rehab Potential Significant repair, 3rd arthroscopic shoulder sx    PT Frequency 2x / week   PT Duration 12 weeks   PT Treatment/Interventions Aquatic Therapy;Moist Heat;Iontophoresis 26m/ml Dexamethasone;Cryotherapy;Electrical Stimulation;Therapeutic exercise;Therapeutic activities;Neuromuscular re-education;Taping;Manual techniques;Dry needling;Biofeedback;Passive range of motion   PT Next Visit Plan Progress ROM and manual interventions per protocol    PT Home Exercise Plan Passive ER within designated guidelines, same for PROM flexion.    Consulted and Agree with Plan of Care Patient      Patient will benefit from skilled therapeutic intervention in order to improve the following deficits and impairments:  Pain, Improper body mechanics, Decreased range of motion, Decreased strength, Impaired UE functional use, Decreased activity tolerance, Decreased knowledge of  precautions  Visit Diagnosis: Muscle weakness (generalized)  Chronic left shoulder pain     Problem List Patient Active Problem List   Diagnosis Date Noted  . Trochanteric bursitis, left hip 02/01/2016  . Pre-diabetes 08/05/2015  . Seasonal allergies 08/05/2015  . Elevated lipids 08/05/2015  . Chest tightness 06/11/2015  . Cutaneous skin tags 01/05/2015  . GERD without esophagitis 12/07/2014  . BPH (benign prostatic hyperplasia) 12/07/2014  . Diastolic CHF ([facility] 097/35/3299 . Shortness of breath 06/09/2013  . H/O mitral valve repair 06/03/2012  . Heart palpitations 06/03/2012  . Dizziness 06/03/2012   PRoyce MacadamiaPT, DPT, CSCS    01/09/2017, 12:59 PM  [place] [facility] [address] [place], [address] Phone: [phone]   Fax:  [phone]  Name: SHUBHAM THACKSTON MRN: [id number] Date of Birth: 09/09/1955

## 2017-01-10 ENCOUNTER — Ambulatory Visit: Payer: 59

## 2017-01-10 ENCOUNTER — Ambulatory Visit (INDEPENDENT_AMBULATORY_CARE_PROVIDER_SITE_OTHER): Payer: 59 | Admitting: Podiatry

## 2017-01-10 ENCOUNTER — Encounter: Payer: Self-pay | Admitting: Podiatry

## 2017-01-10 DIAGNOSIS — L603 Nail dystrophy: Secondary | ICD-10-CM

## 2017-01-10 DIAGNOSIS — M2041 Other hammer toe(s) (acquired), right foot: Secondary | ICD-10-CM

## 2017-01-10 NOTE — Progress Notes (Signed)
He presents today concerned about his hallux nail. He states that he has a thick toenail and has had rotator cuff surgery and is unable to cut the nail.  Objective: Vital signs are stable he is alert and oriented 3 dystrophic nail hallux left is thick yellow dystrophic is not appear to be mycotic but is painful on palpation which are incurvated nail margins.  Assessment: Ingrown dystrophic thickened nail. Painful in nature.  Plan: Reviewed painful hallux nail left.

## 2017-01-11 ENCOUNTER — Ambulatory Visit: Payer: 59 | Admitting: Physical Therapy

## 2017-01-11 DIAGNOSIS — M25512 Pain in left shoulder: Secondary | ICD-10-CM | POA: Diagnosis not present

## 2017-01-11 DIAGNOSIS — G8929 Other chronic pain: Secondary | ICD-10-CM | POA: Diagnosis not present

## 2017-01-11 DIAGNOSIS — M6281 Muscle weakness (generalized): Secondary | ICD-10-CM

## 2017-01-11 NOTE — Patient Instructions (Addendum)
AAROM on wedge x 12 repetitions to maximum of 141 degrees  Isometric holds at 60, 75, 90 degrees of forward elevation x3 for 10" at a time at each position   PROM into ER at various angles of abduction with excellent range and soft tissue minimal resistance   UE Ranger x 12 (able to complete last 3 with LUE only) performed in scaption to reduce sensation of grinding and AAROM with weight shifting and PT cuing for minimal UT involvement x 2sets additional sets   Chest press x 12 in supine with scapular punches (felt weak, but capable of completing) on wedge x 3 sets with PT assistance minA  On wedge isometric ER from 10-20 degrees of abduction x 10 for 2-3" holds

## 2017-01-14 NOTE — Therapy (Signed)
[place] [facility]TS MEDICINE [address] [place], [address] Phone: [phone]   Fax:  [phone]  Physical Therapy Treatment  Patient Details  Name: Austin Stewart MRN: [id number] Date of Birth: 06-Dec-1955 Referring Provider: Dr. Austin Miles  Encounter Date: 01/11/2017      PT End of Session - 01/14/17 2112    Visit Number 19   Number of Visits 25   Date for PT Re-Evaluation 02/19/17   PT Start Time 1645   PT Stop Time 1730   PT Time Calculation (min) 45 min   Activity Tolerance Patient tolerated treatment well   Behavior During Therapy [facility] for tasks assessed/performed      Past Medical History:  Diagnosis Date  . Allergic rhinitis, cause unspecified   . Allergy   . Cardiac dysrhythmia, unspecified   . Diverticulosis of colon (without mention of hemorrhage)   . Dysrhythmia   . Esophageal reflux   . Measles without mention of complication   . Mumps without mention of complication   . Osteoarthrosis, unspecified whether generalized or localized, unspecified site   . 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 Unspecified hyperplasia of prostate without urinary obstruction and other lower urinary tract symptoms (LUTS)     Past Surgical History:  Procedure Laterality Date  . CARDIAC CATHETERIZATION  2006   [facility]   . HERNIA REPAIR    . MITRAL VALVE REPAIR    . rotator cuff repair    . SHOULDER ARTHROSCOPY Left 08/24/2016   Procedure: ARTHROSCOPY SHOULDER with debridement;  Surgeon: Juanell Fairly, MD;  Location: [facility] ORS;  Service: Orthopedics;  Laterality: Left;  . TONSILLECTOMY      There were no vitals filed for this visit.      Subjective Assessment - 01/14/17 2110    Subjective Patient reports he gets mild aches still, but he is in much less discomfort than before the operation.    Patient is accompained by: Family member   Pertinent History Previously failed  rotator cuff repair from years ago, biceps tenodesis performed years ago in that procedure.    Limitations Lifting;House hold activities   Diagnostic tests Intra-operative findings indicative of split cuff tendon and full tear of infraspinatus and supraspinatus   Patient Stated Goals To return to full work duties.    Currently in Pain? No/denies      AAROM on wedge x 12 repetitions to maximum of 141 degrees  Isometric holds at 60, 75, 90 degrees of forward elevation x3 for 10" at a time at each position   PROM into ER at various angles of abduction with excellent range and soft tissue minimal resistance   UE Ranger x 12 (able to complete last 3 with LUE only) performed in scaption to reduce sensation of grinding and AAROM with weight shifting and PT cuing for minimal UT involvement x 2sets additional sets   Chest press x 12 in supine with scapular punches (felt weak, but capable of completing) on wedge x 3 sets with PT assistance minA  On wedge isometric ER from 10-20 degrees of abduction x 10 for 2-3" holds                            PT Education - 01/14/17 2111    Education provided Yes   Education Details His ROM and strength are coming along nicely, provided gentle isometrics  and progressive AAROM and AROM for HEP.    Person(s) Educated Patient   Methods Explanation;Demonstration;Handout   Comprehension Returned demonstration;Verbalized understanding             PT Long Term Goals - 12/05/16 1110      PT LONG TERM GOAL #1   Title Patient will demonstrate at least 150 degrees of active shoulder flexion to perform overhead activities   Baseline Unable - precautions    Time 16   Period Weeks   Status New     PT LONG TERM GOAL #2   Title Patient will be able to perform light lifting duties (10# or less) at work to complete work related duties with no increase in pain.    Baseline Unable - precautions    Time 16   Period Weeks   Status New      PT LONG TERM GOAL #3   Title Patient will report worst pain of no more than 2/10 in LUE to demonstrate improved tolerance for work related activities.    Baseline No pain currently, but multi-modal use of pain medications/interventions.    Time 16   Period Weeks   Status New     PT LONG TERM GOAL #4   Title Patient will report Quick Dash score of less than 25% to demonstrate improved tolerance for ADLs.    Baseline Did not complete due to immobilization.    Time 16   Period Weeks   Status New               Plan - 01/14/17 2114    Clinical Impression Statement Patient continues to improve his AROM and AAROM in forward elevation demonstrating improving strength and tolerance to activity. He is still unable to elevate his arm in flexion against gravity due to weakness, but is progressing well towards this.    Clinical Presentation Stable   Clinical Decision Making Moderate   Rehab Potential Good   Clinical Impairments Affecting Rehab Potential Significant repair, 3rd arthroscopic shoulder sx    PT Frequency 2x / week   PT Duration 12 weeks   PT Treatment/Interventions Aquatic Therapy;Moist Heat;Iontophoresis /ml Dexamethasone;Cryotherapy;Electrical Stimulation;Therapeutic exercise;Therapeutic activities;Neuromuscular re-education;Taping;Manual techniques;Dry needling;Biofeedback;Passive range of motion   PT Next Visit Plan Progress ROM and manual interventions per protocol    PT Home Exercise Plan Passive ER within designated guidelines, same for PROM flexion.    Consulted and Agree with Plan of Care Patient      Patient will benefit from skilled therapeutic intervention in order to improve the following deficits and impairments:  Pain, Improper body mechanics, Decreased range of motion, Decreased strength, Impaired UE functional use, Decreased activity tolerance, Decreased knowledge of precautions  Visit Diagnosis: Muscle weakness (generalized)  Chronic left shoulder  pain     Problem List Patient Active Problem List   Diagnosis Date Noted  . Trochanteric bursitis, left hip 02/01/2016  . Pre-diabetes 08/05/2015  . Seasonal allergies 08/05/2015  . Elevated lipids 08/05/2015  . Chest tightness 06/11/2015  . Cutaneous skin tags 01/05/2015  . GERD without esophagitis 12/07/2014  . BPH (benign prostatic hyperplasia) 12/07/2014  . Diastolic CHF (HCC) 06/09/2014  . Shortness of breath 06/09/2013  . H/O mitral valve repair 06/03/2012  . Heart palpitations 06/03/2012  . Dizziness 06/03/2012   Alva Garnet PT, DPT, CSCS    01/14/2017, 9:16 PM  [place] [facility] PHYSICAL AND SPORTS MEDICINE [address] [place], [address] Phone: [phone]   Fax:  [phone]  Name: Austin Stewart MRN: [id number] Date of Birth: [birth year]

## 2017-01-15 ENCOUNTER — Ambulatory Visit: Payer: 59 | Admitting: Physical Therapy

## 2017-01-15 DIAGNOSIS — M6281 Muscle weakness (generalized): Secondary | ICD-10-CM | POA: Diagnosis not present

## 2017-01-15 DIAGNOSIS — M25512 Pain in left shoulder: Secondary | ICD-10-CM

## 2017-01-15 DIAGNOSIS — G8929 Other chronic pain: Secondary | ICD-10-CM | POA: Diagnosis not present

## 2017-01-15 NOTE — Patient Instructions (Addendum)
AROM in supine to 157 degrees after roughly 12 warm up attempts for forward elevation   AAROM on wedge to max of 155 degrees on second bout x 12 repetitions (pain noted just a the end of the ROM)   AROM to 80-85 degrees actively on wedge x8, x11, x 10 repetitions (began to feel tired, educated to use contralateral limb to reduce shrugging)   On wedge AAROM for end range flexion x 8 repetitions with PT assisting/cuing/guiding x 10 for 2 sets.  Seated ER with yellow t-band x 15 with towel between elbow and ribcage for 3 sets   Chest press with bodyweight only x 10 (very mild grinding sensation initially, which reduced with cuing for elbow out further) x 2 sets  Rhythmic stabilization at roughly 120 degrees x 30" x 3 sets   UE Ranger x 10 repetitions (walking forward to reduce UT demand)

## 2017-01-16 NOTE — Therapy (Signed)
[facility]ORTS MEDICINE [address], [place], [address] Phone: [phone]   Fax:  [phone]  Physical Therapy Treatment  Patient Details  Name: Austin Stewart MRN: [id number] Date of Birth: Jul 31, 1955 Referring Provider: Dr. Austin Miles  Encounter Date: 01/15/2017      PT End of Session - 01/15/17 1626    Visit Number 20   Number of Visits 25   Date for PT Re-Evaluation 02/19/17   PT Start Time 1614   PT Stop Time 1700   PT Time Calculation (min) 46 min   Activity Tolerance Patient tolerated treatment well   Behavior During Therapy [facility] for tasks assessed/performed      Past Medical History:  Diagnosis Date  . Allergic rhinitis, cause unspecified   . Allergy   . Cardiac dysrhythmia, unspecified   . Diverticulosis of colon (without mention of hemorrhage)   . Dysrhythmia   . Esophageal reflux   . Measles without mention of complication   . Mumps without mention of complication   . Osteoarthrosis, unspecified whether generalized or localized, unspecified site   . 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 Unspecified hyperplasia of prostate without urinary obstruction and other lower urinary tract symptoms (LUTS)     Past Surgical History:  Procedure Laterality Date  . CARDIAC CATHETERIZATION  2006   [facility]   . HERNIA REPAIR    . MITRAL VALVE REPAIR    . rotator cuff repair    . SHOULDER ARTHROSCOPY Left 08/24/2016   Procedure: ARTHROSCOPY SHOULDER with debridement;  Surgeon: Juanell Fairly, MD;  Location: [facility] ORS;  Service: Orthopedics;  Laterality: Left;  . TONSILLECTOMY      There were no vitals filed for this visit.      Subjective Assessment - 01/15/17 1615    Subjective Patient reports on Saturday he had some mild muscle soreness, but that subsided quickly.    Patient is accompained by: Family member   Pertinent History Previously failed rotator cuff  repair from years ago, biceps tenodesis performed years ago in that procedure.    Limitations Lifting;House hold activities   Diagnostic tests Intra-operative findings indicative of split cuff tendon and full tear of infraspinatus and supraspinatus   Patient Stated Goals To return to full work duties.    Currently in Pain? No/denies      AROM in supine to 157 degrees after roughly 12 warm up attempts for forward elevation   AAROM on wedge to max of 155 degrees on second bout x 12 repetitions (pain noted just a the end of the ROM)   AROM to 80-85 degrees actively on wedge x8, x11, x 10 repetitions (began to feel tired, educated to use contralateral limb to reduce shrugging)   On wedge AAROM for end range flexion x 8 repetitions with PT assisting/cuing/guiding x 10 for 2 sets.  Seated ER with yellow t-band x 15 with towel between elbow and ribcage for 3 sets   Chest press with bodyweight only x 10 (very mild grinding sensation initially, which reduced with cuing for elbow out further) x 2 sets  Rhythmic stabilization at roughly 120 degrees x 30" x 3 sets   UE Ranger x 10 repetitions (walking forward to reduce UT demand)                             PT Education - 01/15/17  1620    Education provided Yes   Education Details Progressed AAROM and AROM in sidelying    Person(s) Educated Patient   Methods Explanation;Demonstration;Handout   Comprehension Verbalized understanding;Returned demonstration             PT Long Term Goals - 12/05/16 1110      PT LONG TERM GOAL #1   Title Patient will demonstrate at least 150 degrees of active shoulder flexion to perform overhead activities   Baseline Unable - precautions    Time 16   Period Weeks   Status New     PT LONG TERM GOAL #2   Title Patient will be able to perform light lifting duties (10# or less) at work to complete work related duties with no increase in pain.    Baseline Unable - precautions     Time 16   Period Weeks   Status New     PT LONG TERM GOAL #3   Title Patient will report worst pain of no more than 2/10 in LUE to demonstrate improved tolerance for work related activities.    Baseline No pain currently, but multi-modal use of pain medications/interventions.    Time 16   Period Weeks   Status New     PT LONG TERM GOAL #4   Title Patient will report Quick Dash score of less than 25% to demonstrate improved tolerance for ADLs.    Baseline Did not complete due to immobilization.    Time 16   Period Weeks   Status New               Plan - 01/15/17 1626    Clinical Impression Statement Patient continues to progress well with AROM and AAROM with minimal residual pain/soreness after completing. He is still unable to adequately lift his arm against gravity. He has been progressing well with external rotation strengthening, and his passive motion has increased to goal ranges.    Clinical Presentation Stable   Clinical Decision Making Moderate   Rehab Potential Good   Clinical Impairments Affecting Rehab Potential Significant repair, 3rd arthroscopic shoulder sx    PT Frequency 2x / week   PT Duration 12 weeks   PT Treatment/Interventions Aquatic Therapy;Moist Heat;Iontophoresis /ml Dexamethasone;Cryotherapy;Electrical Stimulation;Therapeutic exercise;Therapeutic activities;Neuromuscular re-education;Taping;Manual techniques;Dry needling;Biofeedback;Passive range of motion   PT Next Visit Plan Progress ROM and manual interventions per protocol    PT Home Exercise Plan Passive ER within designated guidelines, same for PROM flexion.    Consulted and Agree with Plan of Care Patient      Patient will benefit from skilled therapeutic intervention in order to improve the following deficits and impairments:  Pain, Improper body mechanics, Decreased range of motion, Decreased strength, Impaired UE functional use, Decreased activity tolerance, Decreased knowledge of  precautions  Visit Diagnosis: Muscle weakness (generalized)  Chronic left shoulder pain     Problem List Patient Active Problem List   Diagnosis Date Noted  . Trochanteric bursitis, left hip 02/01/2016  . Pre-diabetes 08/05/2015  . Seasonal allergies 08/05/2015  . Elevated lipids 08/05/2015  . Chest tightness 06/11/2015  . Cutaneous skin tags 01/05/2015  . GERD without esophagitis 12/07/2014  . BPH (benign prostatic hyperplasia) 12/07/2014  . Diastolic CHF (HCC) 06/09/2014  . Shortness of breath 06/09/2013  . H/O mitral valve repair 06/03/2012  . Heart palpitations 06/03/2012  . Dizziness 06/03/2012   Alva Garnet PT, DPT, CSCS     01/16/2017, 12:39 PM  [place] [facility] PHYSICAL  AND SPORTS MEDICINE [address], [place], [address] Phone: [phone]   Fax:  [phone]  Name: KENTARO ALEWINE MRN: [id number] Date of Birth: 17-Apr-1956

## 2017-01-18 ENCOUNTER — Ambulatory Visit: Payer: 59 | Admitting: Physical Therapy

## 2017-01-22 ENCOUNTER — Ambulatory Visit: Payer: 59 | Admitting: Physical Therapy

## 2017-01-23 ENCOUNTER — Ambulatory Visit: Payer: 59 | Admitting: Physical Therapy

## 2017-01-23 DIAGNOSIS — M6281 Muscle weakness (generalized): Secondary | ICD-10-CM | POA: Diagnosis not present

## 2017-01-23 DIAGNOSIS — M25512 Pain in left shoulder: Secondary | ICD-10-CM | POA: Diagnosis not present

## 2017-01-23 DIAGNOSIS — G8929 Other chronic pain: Secondary | ICD-10-CM | POA: Diagnosis not present

## 2017-01-24 NOTE — Therapy (Signed)
[place] [facility] PHYSICAL [facility] SPORTS MEDICINE [address] [place], [address] Phone: [phone]   Fax:  [phone]  Physical Therapy Treatment  Patient Details  Name: Austin Stewart MRN: [id number] Date of Birth: [birth year] Referring Provider: Dr. Austin Miles  Encounter Date: 01/23/2017      PT End of Session - 01/24/17 0920    Visit Number 21   Number of Visits 25   Date for PT Re-Evaluation 02/19/17   PT Start Time 1405   PT Stop Time 1445   PT Time Calculation (min) 40 min   Activity Tolerance Patient tolerated treatment well   Behavior During Therapy [facility] for tasks assessed/performed      Past Medical History:  Diagnosis Date  . Allergic rhinitis, cause unspecified   . Allergy   . Cardiac dysrhythmia, unspecified   . Diverticulosis of colon (without mention of hemorrhage)   . Dysrhythmia   . Esophageal reflux   . Measles without mention of complication   . Mumps without mention of complication   . Osteoarthrosis, unspecified whether generalized or localized, unspecified site   . 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 Unspecified hyperplasia of prostate without urinary obstruction and other lower urinary tract symptoms (LUTS)     Past Surgical History:  Procedure Laterality Date  . CARDIAC CATHETERIZATION  2006   [facility]   . HERNIA REPAIR    . MITRAL VALVE REPAIR    . rotator cuff repair    . SHOULDER ARTHROSCOPY Left 08/24/2016   Procedure: ARTHROSCOPY SHOULDER with debridement;  Surgeon: Juanell Fairly, MD;  Location: [facility] OR[facility];  Service: Orthopedics;  Laterality: Left;  . TONSILLECTOMY      There were no vitals filed for this visit.      Subjective Assessment - 01/24/17 0918    Subjective Patient reports he continues to notice more functional use of his LUE without pain. He still has difficulty elevating his LUE secondary to weakness, but is steadily improving week by  week.    Patient is accompained by: Family member   Pertinent History Previously failed rotator cuff repair from years ago, biceps tenodesis performed years ago in that procedure.    Limitations Lifting;House hold activities   Diagnostic tests Intra-operative findings indicative of split cuff tendon and full tear of infraspinatus and supraspinatus   Patient Stated Goals To return to full work duties.    Currently in Pain? No/denies      Submerged ER bilaterally at 0 degrees abduction - 20 repetitions   Lawnmower submerged x 20 repetitions   Submerged shoulder flexion x 20 repetitions for 2 sets   Standing horizontal abductions with cuing for depressing and retracting scapulae x 20   Shoulder flexion with AAROM through RUE, second set with pool noodle through submerged portion of ROM (80-90 degrees) with minimal elevations (noted to have excessive UT activity with more than minimal elevation out of submerged section) x 12 repetitions for 2 sets in both conditions   Scarecrow exercise while submerged x 15 repetitions for 2 sets at roughly 60 degrees of abduction/scaption   2 laps of ambulating with arm swings to surface of water with no increase in pain reported   Bear hug/cross overs x 20 repetitions                           PT Education - 01/24/17 4742  Education provided Yes   Education Details Given his response to treatment today, he can return to the pool, but it appears to be too easy for him now.    Person(s) Educated Patient   Methods Demonstration;Explanation   Comprehension Verbalized understanding;Returned demonstration             PT Long Term Goals - 12/05/16 1110      PT LONG TERM GOAL #1   Title Patient will demonstrate at least 150 degrees of active shoulder flexion to perform overhead activities   Baseline Unable - precautions    Time 16   Period Weeks   Status New     PT LONG TERM GOAL #2   Title Patient will be able to  perform light lifting duties (10# or less) at work to complete work related duties with no increase in pain.    Baseline Unable - precautions    Time 16   Period Weeks   Status New     PT LONG TERM GOAL #3   Title Patient will report worst pain of no more than 2/10 in LUE to demonstrate improved tolerance for work related activities.    Baseline No pain currently, but multi-modal use of pain medications/interventions.    Time 16   Period Weeks   Status New     PT LONG TERM GOAL #4   Title Patient will report Quick Dash score of less than 25% to demonstrate improved tolerance for ADLs.    Baseline Did not complete due to immobilization.    Time 16   Period Weeks   Status New               Plan - 01/24/17 1610    Clinical Impression Statement Patient demonstrates excellent tolerance for all activities provided this date. He still struggles to elevate his LUE above 80-90 degees of flexion against gravity secondary to weakness (notable for increasing UT activity). He reports very little fatigue initially after session, and discussed with patient that pool may no longer be appropriate stimulus for him to continue making progress, though can help with keeping stiffness reduced.   Clinical Presentation Stable   Clinical Decision Making Moderate   Rehab Potential Good   Clinical Impairments Affecting Rehab Potential Significant repair, 3rd arthroscopic shoulder sx    PT Frequency 2x / week   PT Duration 12 weeks   PT Treatment/Interventions Aquatic Therapy;Moist Heat;Iontophoresis /ml Dexamethasone;Cryotherapy;Electrical Stimulation;Therapeutic exercise;Therapeutic activities;Neuromuscular re-education;Taping;Manual techniques;Dry needling;Biofeedback;Passive range of motion   PT Next Visit Plan Progress ROM and manual interventions per protocol    PT Home Exercise Plan Passive ER within designated guidelines, same for PROM flexion.    Consulted and Agree with Plan of Care Patient       Patient will benefit from skilled therapeutic intervention in order to improve the following deficits and impairments:  Pain, Improper body mechanics, Decreased range of motion, Decreased strength, Impaired UE functional use, Decreased activity tolerance, Decreased knowledge of precautions  Visit Diagnosis: Muscle weakness (generalized)  Chronic left shoulder pain     Problem List Patient Active Problem List   Diagnosis Date Noted  . Trochanteric bursitis, left hip 02/01/2016  . Pre-diabetes 08/05/2015  . Seasonal allergies 08/05/2015  . Elevated lipids 08/05/2015  . Chest tightness 06/11/2015  . Cutaneous skin tags 01/05/2015  . GERD without esophagitis 12/07/2014  . BPH (benign prostatic hyperplasia) 12/07/2014  . Diastolic CHF (HCC) 06/09/2014  . Shortness of breath 06/09/2013  . H/O mitral valve repair  06/03/2012  . Heart palpitations 06/03/2012  . Dizziness 06/03/2012   Alva Garnet PT, DPT, CSCS    01/24/2017, 9:23 AM  [place] [facility] PHYSICAL AND SPORTS MEDICINE [address], [place], [address] Phone: [phone]   Fax:  [phone]  Name: Austin Stewart MRN: [id number] Date of Birth: 28-Nov-1955

## 2017-01-25 ENCOUNTER — Ambulatory Visit: Payer: 59 | Admitting: Physical Therapy

## 2017-01-25 DIAGNOSIS — M25512 Pain in left shoulder: Secondary | ICD-10-CM | POA: Diagnosis not present

## 2017-01-25 DIAGNOSIS — G8929 Other chronic pain: Secondary | ICD-10-CM | POA: Diagnosis not present

## 2017-01-25 DIAGNOSIS — M6281 Muscle weakness (generalized): Secondary | ICD-10-CM | POA: Diagnosis not present

## 2017-01-25 NOTE — Patient Instructions (Addendum)
AROM in flexion to 153 degrees with mild pain at the end range x 12 repetitions   PROM into ER at 45 degrees of abduction x 5 repetitions to 70 degrees  AROM in sitting to 78 degrees   On wedge -- 85 degrees initially x 8 repetitions x 9 repetitions (PT monitoring for UT substitution) gradually reduced his AROM x 9 repetutions, no pain just difficult at top of AROM.   On wedge with PVC pipe and 1 and 1/2 pound weight to 130 degrees x 10 repetitions for 2 sets, patient gradually began to lose ROM.  ER   Chest Press 1# x 10 with PT assisting for 2 sets on wedge (very minimal pain at end range)

## 2017-01-26 NOTE — Therapy (Signed)
[facility]INE [address], [place], [address] Phone: [phone]   Fax:  [phone]  Physical Therapy Treatment  Patient Details  Name: Austin Stewart MRN: [id number] Date of Birth: 1956/04/24 Referring Provider: Dr. Austin Miles  Encounter Date: 01/25/2017      PT End of Session - 01/25/17 1629    Visit Number 22   Number of Visits 25   Date for PT Re-Evaluation 02/19/17   PT Start Time 1617   PT Stop Time 1700   PT Time Calculation (min) 43 min   Activity Tolerance Patient tolerated treatment well   Behavior During Therapy [facility] for tasks assessed/performed      Past Medical History:  Diagnosis Date  . Allergic rhinitis, cause unspecified   . Allergy   . Cardiac dysrhythmia, unspecified   . Diverticulosis of colon (without mention of hemorrhage)   . Dysrhythmia   . Esophageal reflux   . Measles without mention of complication   . Mumps without mention of complication   . Osteoarthrosis, unspecified whether generalized or localized, unspecified site   . 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 Unspecified hyperplasia of prostate without urinary obstruction and other lower urinary tract symptoms (LUTS)     Past Surgical History:  Procedure Laterality Date  . CARDIAC CATHETERIZATION  2006   [facility]   . HERNIA REPAIR    . MITRAL VALVE REPAIR    . rotator cuff repair    . SHOULDER ARTHROSCOPY Left 08/24/2016   Procedure: ARTHROSCOPY SHOULDER with debridement;  Surgeon: Juanell Fairly, MD;  Location: [facility] ORS;  Service: Orthopedics;  Laterality: Left;  . TONSILLECTOMY      There were no vitals filed for this visit.      Subjective Assessment - 01/25/17 1617    Subjective Patient reports he had some soreness after last session, though no joint based pain. He reports this has since dissipated.    Patient is accompained by: Family member   Pertinent  History Previously failed rotator cuff repair from years ago, biceps tenodesis performed years ago in that procedure.    Limitations Lifting;House hold activities   Diagnostic tests Intra-operative findings indicative of split cuff tendon and full tear of infraspinatus and supraspinatus   Patient Stated Goals To return to full work duties.    Currently in Pain? No/denies      AROM in flexion to 153 degrees with mild pain at the end range x 12 repetitions   PROM into ER at 45 degrees of abduction x 5 repetitions to 70 degrees  AROM in sitting to 78 degrees   On wedge -- 85 degrees initially x 8 repetitions x 9 repetitions (PT monitoring for UT substitution) gradually reduced his AROM x 9 repetutions, no pain just difficult at top of AROM.   On wedge with PVC pipe and 1 and 1/2 pound weight to 130 degrees x 10 repetitions for 2 sets, patient gradually began to lose ROM.  ER on wedge with 1.5# ankle weight at 0 degrees of abduction and 45 degrees of abduction with therapist supporting elbow x 12 repetitions in each.   Chest Press 1# x 10 with PT assisting for 2 sets on wedge (very minimal pain at end range)                             PT Education -  01/25/17 1630    Education provided Yes   Education Details Provided education and HEP to increase AROM in reclined position.    Person(s) Educated Patient   Methods Explanation;Demonstration;Handout   Comprehension Verbalized understanding;Returned demonstration             PT Long Term Goals - 12/05/16 1110      PT LONG TERM GOAL #1   Title Patient will demonstrate at least 150 degrees of active shoulder flexion to perform overhead activities   Baseline Unable - precautions    Time 16   Period Weeks   Status New     PT LONG TERM GOAL #2   Title Patient will be able to perform light lifting duties (10# or less) at work to complete work related duties with no increase in pain.    Baseline Unable -  precautions    Time 16   Period Weeks   Status New     PT LONG TERM GOAL #3   Title Patient will report worst pain of no more than 2/10 in LUE to demonstrate improved tolerance for work related activities.    Baseline No pain currently, but multi-modal use of pain medications/interventions.    Time 16   Period Weeks   Status New     PT LONG TERM GOAL #4   Title Patient will report Quick Dash score of less than 25% to demonstrate improved tolerance for ADLs.    Baseline Did not complete due to immobilization.    Time 16   Period Weeks   Status New               Plan - 01/25/17 1629    Clinical Impression Statement Patient continues to increase his AROM in all positions (supine, on wedge, and in sitting). He does still get soreness after sessions, but this appears to be muscular and short lived. He has progressed nicely with his passive motion to 70 degrees of external rotation, 153 degrees of AAROM in supine. Patient will see MD for follow up next week, but will likely need more therapy visits to continue to regain functional AROM of his LUE.    Clinical Presentation Stable   Clinical Decision Making Moderate   Rehab Potential Good   Clinical Impairments Affecting Rehab Potential Significant repair, 3rd arthroscopic shoulder sx    PT Frequency 2x / week   PT Duration 12 weeks   PT Treatment/Interventions Aquatic Therapy;Moist Heat;Iontophoresis /ml Dexamethasone;Cryotherapy;Electrical Stimulation;Therapeutic exercise;Therapeutic activities;Neuromuscular re-education;Taping;Manual techniques;Dry needling;Biofeedback;Passive range of motion   PT Next Visit Plan Progress ROM and manual interventions per protocol    PT Home Exercise Plan Passive ER within designated guidelines, same for PROM flexion.    Consulted and Agree with Plan of Care Patient      Patient will benefit from skilled therapeutic intervention in order to improve the following deficits and impairments:   Pain, Improper body mechanics, Decreased range of motion, Decreased strength, Impaired UE functional use, Decreased activity tolerance, Decreased knowledge of precautions  Visit Diagnosis: Muscle weakness (generalized)  Chronic left shoulder pain     Problem List Patient Active Problem List   Diagnosis Date Noted  . Trochanteric bursitis, left hip 02/01/2016  . Pre-diabetes 08/05/2015  . Seasonal allergies 08/05/2015  . Elevated lipids 08/05/2015  . Chest tightness 06/11/2015  . Cutaneous skin tags 01/05/2015  . GERD without esophagitis 12/07/2014  . BPH (benign prostatic hyperplasia) 12/07/2014  . Diastolic CHF (HCC) 06/09/2014  . Shortness of breath 06/09/2013  .  H/O mitral valve repair 06/03/2012  . Heart palpitations 06/03/2012  . Dizziness 06/03/2012   Alva Garnet PT, DPT, CSCS     01/26/2017, 8:47 AM   [facility] PHYSICAL AND SPORTS MEDICINE [address] [place], [address] Phone: [phone]   Fax:  [phone]  Name: Austin Stewart MRN: [id number] Date of Birth: 1955-12-08

## 2017-01-29 ENCOUNTER — Ambulatory Visit: Payer: 59 | Attending: Orthopaedic Surgery | Admitting: Physical Therapy

## 2017-01-29 DIAGNOSIS — G8929 Other chronic pain: Secondary | ICD-10-CM | POA: Diagnosis not present

## 2017-01-29 DIAGNOSIS — M6281 Muscle weakness (generalized): Secondary | ICD-10-CM | POA: Diagnosis not present

## 2017-01-29 DIAGNOSIS — M25512 Pain in left shoulder: Secondary | ICD-10-CM | POA: Insufficient documentation

## 2017-01-29 NOTE — Patient Instructions (Addendum)
AROM in sitting initially between 95-97 degrees   Warm up exercises to include supine active flexion through full ROM actively. X 15 repetitions   AROM flexion on the wedge through roughly 90-100 degrees of motion holding his upper trap area to provide feedback not to shrug x 10 repetitions for 3 sets   AAROM flexion on the wedge for 15 repetitions with 1.5# on PVC pipe for 2 bouts, 3# weight x 15 repetitions for 2 sets   Chest press with serratus punch at the end for serratus recruitment with 1.5# on PVC pipe on wedge x 10 for 3 sets   Seated bilateral ER with yellow t-band x 12, x8 (audible cavitations noted by patient on concentric portion, thus dc'd)

## 2017-01-30 NOTE — Therapy (Signed)
[facility] PHYS[facility]TS MEDICINE [address], [place], [address] Phone: [phone]   Fax:  [phone]  Physical Therapy Treatment  Patient Details  Name: Austin Stewart MRN: [id number] Date of Birth: 03-06-1956 Referring Provider: Dr. Austin Miles  Encounter Date: 01/29/2017      PT End of Session - 01/29/17 1642    Visit Number 23   Number of Visits 25   Date for PT Re-Evaluation 02/19/17   PT Start Time 1635   PT Stop Time 1720   PT Time Calculation (min) 45 min   Activity Tolerance Patient tolerated treatment well   Behavior During Therapy [facility] for tasks assessed/performed      Past Medical History:  Diagnosis Date  . Allergic rhinitis, cause unspecified   . Allergy   . Cardiac dysrhythmia, unspecified   . Diverticulosis of colon (without mention of hemorrhage)   . Dysrhythmia   . Esophageal reflux   . Measles without mention of complication   . Mumps without mention of complication   . Osteoarthrosis, unspecified whether generalized or localized, unspecified site   . 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 Unspecified hyperplasia of prostate without urinary obstruction and other lower urinary tract symptoms (LUTS)     Past Surgical History:  Procedure Laterality Date  . CARDIAC CATHETERIZATION  2006   [facility]   . HERNIA REPAIR    . MITRAL VALVE REPAIR    . rotator cuff repair    . SHOULDER ARTHROSCOPY Left 08/24/2016   Procedure: ARTHROSCOPY SHOULDER with debridement;  Surgeon: Juanell Fairly, MD;  Location: [facility] ORS;  Service: Orthopedics;  Laterality: Left;  . TONSILLECTOMY      There were no vitals filed for this visit.      Subjective Assessment - 01/29/17 1637    Subjective Patient reports he had a good weekend, no additional pain though still limited ROM.    Patient is accompained by: Family member   Pertinent History Previously failed rotator cuff repair  from years ago, biceps tenodesis performed years ago in that procedure.    Limitations Lifting;House hold activities   Diagnostic tests Intra-operative findings indicative of split cuff tendon and full tear of infraspinatus and supraspinatus   Patient Stated Goals To return to full work duties.    Currently in Pain? No/denies        AROM in sitting initially between 95-97 degrees   Warm up exercises to include supine active flexion through full ROM actively. X 15 repetitions   AROM flexion on the wedge through roughly 90-100 degrees of motion holding his upper trap area to provide feedback not to shrug x 10 repetitions for 3 sets   AAROM flexion on the wedge for 15 repetitions with 1.5# on PVC pipe for 2 bouts, 3# weight x 15 repetitions for 2 sets   Chest press with serratus punch at the end for serratus recruitment with 1.5# on PVC pipe on wedge x 10 for 3 sets   Seated bilateral ER with yellow t-band x 12, x8 (audible cavitations noted by patient on concentric portion, thus dc'd)                          PT Education - 01/29/17 1642    Education provided Yes   Education Details Instructed patient to have physician call therapist if she needs to provide any more specific instructions.  Person(s) Educated Patient   Methods Explanation;Demonstration;Handout   Comprehension Verbalized understanding;Returned demonstration             PT Long Term Goals - 12/05/16 1110      PT LONG TERM GOAL #1   Title Patient will demonstrate at least 150 degrees of active shoulder flexion to perform overhead activities   Baseline Unable - precautions    Time 16   Period Weeks   Status New     PT LONG TERM GOAL #2   Title Patient will be able to perform light lifting duties (10# or less) at work to complete work related duties with no increase in pain.    Baseline Unable - precautions    Time 16   Period Weeks   Status New     PT LONG TERM GOAL #3   Title  Patient will report worst pain of no more than 2/10 in LUE to demonstrate improved tolerance for work related activities.    Baseline No pain currently, but multi-modal use of pain medications/interventions.    Time 16   Period Weeks   Status New     PT LONG TERM GOAL #4   Title Patient will report Quick Dash score of less than 25% to demonstrate improved tolerance for ADLs.    Baseline Did not complete due to immobilization.    Time 16   Period Weeks   Status New               Plan - 01/29/17 1642    Clinical Impression Statement Patient has continued to make gradual increase in AROM without increasing pain, while maintaining appropriate scapular mechanics. His passive ROM continues to be within the guidelines of his rehab protocol, and has progressively tolerated very gentle strengthening. He will follow up with his surgeon on Wednesday.    Clinical Presentation Stable   Clinical Decision Making Moderate   Rehab Potential Good   Clinical Impairments Affecting Rehab Potential Significant repair, 3rd arthroscopic shoulder sx    PT Frequency 2x / week   PT Duration 12 weeks   PT Treatment/Interventions Aquatic Therapy;Moist Heat;Iontophoresis /ml Dexamethasone;Cryotherapy;Electrical Stimulation;Therapeutic exercise;Therapeutic activities;Neuromuscular re-education;Taping;Manual techniques;Dry needling;Biofeedback;Passive range of motion   PT Next Visit Plan Progress ROM and manual interventions per protocol    PT Home Exercise Plan Passive ER within designated guidelines, same for PROM flexion.    Consulted and Agree with Plan of Care Patient      Patient will benefit from skilled therapeutic intervention in order to improve the following deficits and impairments:  Pain, Improper body mechanics, Decreased range of motion, Decreased strength, Impaired UE functional use, Decreased activity tolerance, Decreased knowledge of precautions  Visit Diagnosis: Muscle weakness  (generalized)  Chronic left shoulder pain     Problem List Patient Active Problem List   Diagnosis Date Noted  . Trochanteric bursitis, left hip 02/01/2016  . Pre-diabetes 08/05/2015  . Seasonal allergies 08/05/2015  . Elevated lipids 08/05/2015  . Chest tightness 06/11/2015  . Cutaneous skin tags 01/05/2015  . GERD without esophagitis 12/07/2014  . BPH (benign prostatic hyperplasia) 12/07/2014  . Diastolic CHF (HCC) 06/09/2014  . Shortness of breath 06/09/2013  . H/O mitral valve repair 06/03/2012  . Heart palpitations 06/03/2012  . Dizziness 06/03/2012   Alva Garnet PT, DPT, CSCS    01/30/2017, 2:55 PM  [facility] [place] [facility] PHYSICAL AND SPORTS MEDICINE [address], [place], [address] Phone: [phone]   Fax:  [phone]  Name:  Austin Stewart MRN: [id number] Date of Birth: [birth year]

## 2017-02-01 ENCOUNTER — Ambulatory Visit: Payer: 59 | Admitting: Physical Therapy

## 2017-02-01 DIAGNOSIS — G8929 Other chronic pain: Secondary | ICD-10-CM

## 2017-02-01 DIAGNOSIS — M6281 Muscle weakness (generalized): Secondary | ICD-10-CM | POA: Diagnosis not present

## 2017-02-01 DIAGNOSIS — M25512 Pain in left shoulder: Secondary | ICD-10-CM

## 2017-02-01 NOTE — Patient Instructions (Addendum)
Supine arcs from 45-135 x 15 repetitions for warm up for 3 bouts   Progressed arcs to wedge x10, for 4 sets  Prone rows with 1# x 12 for 2 sets, after x 12 with bodyweight which was fairly easy  Seated rows on machine x 5# x 15 repetitions (fairly easy), progressed to 10# x 12 with bilateral UE   AROM with elbow bent to 102 degrees of flexion before pain at end range and need for shrugging.

## 2017-02-05 NOTE — Therapy (Signed)
[place] PHYSICAL AND SPORTS MEDICINE [address] [place], [address] Phone: [phone]   Fax:  [phone]  Physical Therapy Treatment  Patient Details  Name: Austin Stewart MRN: [id number] Date of Birth: 1955/11/05 Referring Provider: Dr. Ricardo Jericho  Encounter Date: 02/01/2017      PT End of Session - 02/05/17 1258    Visit Number 24   Number of Visits 35   Date for PT Re-Evaluation 03/29/17   PT Start Time 1619   PT Stop Time 1704   PT Time Calculation (min) 45 min   Activity Tolerance Patient tolerated treatment well   Behavior During Therapy [facility] for tasks assessed/performed      Past Medical History:  Diagnosis Date  . Allergic rhinitis, cause unspecified   . Allergy   . Cardiac dysrhythmia, unspecified   . Diverticulosis of colon (without mention of hemorrhage)   . Dysrhythmia   . Esophageal reflux   . Measles without mention of complication   . Mumps without mention of complication   . Osteoarthrosis, unspecified whether generalized or localized, unspecified site   . 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 Unspecified hyperplasia of prostate without urinary obstruction and other lower urinary tract symptoms (LUTS)     Past Surgical History:  Procedure Laterality Date  . CARDIAC CATHETERIZATION  2006   [facility]   . HERNIA REPAIR    . MITRAL VALVE REPAIR    . rotator cuff repair    . SHOULDER ARTHROSCOPY Left 08/24/2016   Procedure: ARTHROSCOPY SHOULDER with debridement;  Surgeon: Thornton Park, MD;  Location: [facility] ORS;  Service: Orthopedics;  Laterality: Left;  . TONSILLECTOMY      There were no vitals filed for this visit.      Subjective Assessment - 02/05/17 1256    Subjective Patient reports he had a good check in with physician, she is especially impressed with his ER PROM. He is having much better pain control now than pre-operatively   Patient is  accompained by: Family member   Pertinent History Previously failed rotator cuff repair from years ago, biceps tenodesis performed years ago in that procedure.    Limitations Lifting;House hold activities   Diagnostic tests Intra-operative findings indicative of split cuff tendon and full tear of infraspinatus and supraspinatus   Patient Stated Goals To return to full work duties.    Currently in Pain? No/denies        Supine arcs from 45-135 x 15 repetitions for warm up for 3 bouts   Progressed arcs to wedge x10, for 4 sets  Prone rows with 1# x 12 for 2 sets, after x 12 with bodyweight which was fairly easy  Seated rows on machine x 5# x 15 repetitions (fairly easy), progressed to 10# x 12 with bilateral UE   AROM with elbow bent to 102 degrees of flexion before pain at end range and need for shrugging.                           PT Education - 02/05/17 1258    Education provided Yes   Education Details Will provide HEP and transition to x1 per week until AROM is full    Person(s) Educated Patient   Methods Explanation;Demonstration;Handout   Comprehension Verbalized understanding;Returned demonstration             PT Long Term Goals - 02/05/17  [place] #1   Title Patient will demonstrate at least 150 degrees of active shoulder flexion to perform overhead activities   Baseline ~100 degrees    Time 16   Period Weeks   Status Partially Met     PT LONG TERM GOAL #2   Title Patient will be able to perform light lifting duties (10# or less) at work to complete work related duties with no increase in pain.    Baseline Unable - precautions    Time 16   Period Weeks   Status On-going     PT LONG TERM GOAL #3   Title Patient will report worst pain of no more than 2/10 in LUE to demonstrate improved tolerance for work related activities.    Baseline No pain currently, but multi-modal use of pain medications/interventions.    Time 16    Period Weeks   Status Partially Met     PT LONG TERM GOAL #4   Title Patient will report Quick Dash score of less than 25% to demonstrate improved tolerance for ADLs.    Baseline Did not complete due to immobilization.    Time 16   Period Weeks   Status On-going               Plan - 02/05/17 1258    Clinical Impression Statement Patient continues to steadily increase his AROM, though his scapular and glenohumeral musculature is still quite weak, limiting his AROM. He is having better pain control now, allowing him more functional use and improved sleep quality. Will begin to wean from therapy until AROM is full.    Clinical Presentation Stable   Clinical Decision Making Moderate   Rehab Potential Good   Clinical Impairments Affecting Rehab Potential Significant repair, 3rd arthroscopic shoulder sx    PT Frequency 2x / week   PT Duration 12 weeks   PT Treatment/Interventions Aquatic Therapy;Moist Heat;Iontophoresis 66m/ml Dexamethasone;Cryotherapy;Electrical Stimulation;Therapeutic exercise;Therapeutic activities;Neuromuscular re-education;Taping;Manual techniques;Dry needling;Biofeedback;Passive range of motion   PT Next Visit Plan Progress ROM and manual interventions per protocol    PT Home Exercise Plan Passive ER within designated guidelines, same for PROM flexion.    Consulted and Agree with Plan of Care Patient      Patient will benefit from skilled therapeutic intervention in order to improve the following deficits and impairments:  Pain, Improper body mechanics, Decreased range of motion, Decreased strength, Impaired UE functional use, Decreased activity tolerance, Decreased knowledge of precautions  Visit Diagnosis: Muscle weakness (generalized) - Plan: PT plan of care cert/re-cert  Chronic left shoulder pain - Plan: PT plan of care cert/re-cert     Problem List Patient Active Problem List   Diagnosis Date Noted  . Trochanteric bursitis, left hip 02/01/2016   . Pre-diabetes 08/05/2015  . Seasonal allergies 08/05/2015  . Elevated lipids 08/05/2015  . Chest tightness 06/11/2015  . Cutaneous skin tags 01/05/2015  . GERD without esophagitis 12/07/2014  . BPH (benign prostatic hyperplasia) 12/07/2014  . Diastolic CHF ([place] 002/72/5366 . Shortness of breath 06/09/2013  . H/O mitral valve repair 06/03/2012  . Heart palpitations 06/03/2012  . Dizziness 06/03/2012   PRoyce MacadamiaPT, DPT, CSCS    02/05/2017, 1:03 PM  [facility] [address] [place] [address]Phone: [phone]  Fax:  [phone] Name: Austin TEATERMRN: [id number] of Birth: 806/23/1957

## 2017-02-07 ENCOUNTER — Ambulatory Visit: Payer: 59 | Admitting: Physical Therapy

## 2017-02-07 DIAGNOSIS — M6281 Muscle weakness (generalized): Secondary | ICD-10-CM

## 2017-02-07 DIAGNOSIS — M25512 Pain in left shoulder: Secondary | ICD-10-CM

## 2017-02-07 DIAGNOSIS — G8929 Other chronic pain: Secondary | ICD-10-CM

## 2017-02-07 NOTE — Patient Instructions (Signed)
AROM into flexion to 115 degrees   AROM on wedge by pushing up into chest press position, then AROM to highest range. Progressed to having dowel and 2# weight for bilateral UE x 15 for each condition, progressed to 1# x 15 (good control noted) able to complete 12 repetitions with 2# weight with good control and no pain  Seated rows on OMEGA machine with 5# bilaterally x 15 repetitions, progressed to 10# for 15 repetitions x 2 sets   Unable to do prone 90-90 ERs at this time (weakness)   Prone horizontal abduction x 12 for 3 sets with very min A from therapist (appropriate mid/low trap activation)  UE Ranger x 8, x 8   Seated ER with band x 15 with yellow band (Still easy), x 8

## 2017-02-08 NOTE — Therapy (Signed)
[facility] [address], [place], [address] Phone: [phone]   Fax:  [phone]  Physical Therapy Treatment  Patient Details  Name: Austin Stewart MRN: [id number] Date of Birth: 1955-09-03 Referring Provider: Dr. Ricardo Jericho  Encounter Date: 02/07/2017      PT End of Session - 02/08/17 0953    Visit Number 25   Number of Visits 35   Date for PT Re-Evaluation 03/29/17   PT Start Time 9842   PT Stop Time 1800   PT Time Calculation (min) 45 min   Activity Tolerance Patient tolerated treatment well   Behavior During Therapy [facility] for tasks assessed/performed      Past Medical History:  Diagnosis Date  . Allergic rhinitis, cause unspecified   . Allergy   . Cardiac dysrhythmia, unspecified   . Diverticulosis of colon (without mention of hemorrhage)   . Dysrhythmia   . Esophageal reflux   . Measles without mention of complication   . Mumps without mention of complication   . Osteoarthrosis, unspecified whether generalized or localized, unspecified site   . 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 Unspecified hyperplasia of prostate without urinary obstruction and other lower urinary tract symptoms (LUTS)     Past Surgical History:  Procedure Laterality Date  . CARDIAC CATHETERIZATION  2006   [facility]   . HERNIA REPAIR    . MITRAL VALVE REPAIR    . rotator cuff repair    . SHOULDER ARTHROSCOPY Left 08/24/2016   Procedure: ARTHROSCOPY SHOULDER with debridement;  Surgeon: Thornton Park, MD;  Location: [facility] ORS;  Service: Orthopedics;  Laterality: Left;  . TONSILLECTOMY      There were no vitals filed for this visit.      Subjective Assessment - 02/07/17 1715    Subjective Patient reports he still gets the grinding, but is not having pain like he was. He believes his strength continues to improve.    Patient is accompained by: Family member   Pertinent  History Previously failed rotator cuff repair from years ago, biceps tenodesis performed years ago in that procedure.    Limitations Lifting;House hold activities   Diagnostic tests Intra-operative findings indicative of split cuff tendon and full tear of infraspinatus and supraspinatus   Patient Stated Goals To return to full work duties.    Currently in Pain? No/denies        AROM into flexion to 115 degrees   AROM on wedge by pushing up into chest press position, then AROM to highest range. Progressed to having dowel and 2# weight for bilateral UE x 15 for each condition, progressed to 1# x 15 (good control noted) able to complete 12 repetitions with 2# weight with good control and no pain  Seated rows on OMEGA machine with 5# bilaterally x 15 repetitions, progressed to 10# for 15 repetitions x 2 sets   Unable to do prone 90-90 ERs at this time (weakness)   Prone horizontal abduction x 12 for 3 sets with very min A from therapist (appropriate mid/low trap activation)  UE Ranger x 8, x 8 x 8 through tolerated ROM, no shrugging or excessive UT activity noted   Seated ER with band x 15 with yellow band (Still easy), x 8 for 2 sets with red t-band  PT Education - 02/08/17 0953    Education provided Yes   Education Details HEP continues to improve, keep strength exercises to every other day.    Person(s) Educated Patient   Methods Explanation;Demonstration   Comprehension Verbalized understanding;Returned demonstration             PT Long Term Goals - 02/05/17 1259      PT LONG TERM GOAL #1   Title Patient will demonstrate at least 150 degrees of active shoulder flexion to perform overhead activities   Baseline ~100 degrees    Time 16   Period Weeks   Status Partially Met     PT LONG TERM GOAL #2   Title Patient will be able to perform light lifting duties (10# or less) at work to complete work related duties with no increase  in pain.    Baseline Unable - precautions    Time 16   Period Weeks   Status On-going     PT LONG TERM GOAL #3   Title Patient will report worst pain of no more than 2/10 in LUE to demonstrate improved tolerance for work related activities.    Baseline No pain currently, but multi-modal use of pain medications/interventions.    Time 16   Period Weeks   Status Partially Met     PT LONG TERM GOAL #4   Title Patient will report Quick Dash score of less than 25% to demonstrate improved tolerance for ADLs.    Baseline Did not complete due to immobilization.    Time 16   Period Weeks   Status On-going               Plan - 02/08/17 0953    Clinical Impression Statement Patient continues to improve with AROM each week, up to 115 degrees actively this date. He is beginning to improve ER strength (red vs yellow t-band) but is still quite limited at higher ranges of elevation. His AROM in wedge setting is now nearly full, indicative of continued strength development. He is progressing nicely and continues to improve function week by week.    Clinical Presentation Stable   Clinical Decision Making Moderate   Rehab Potential Good   Clinical Impairments Affecting Rehab Potential Significant repair, 3rd arthroscopic shoulder sx    PT Frequency 2x / week   PT Duration 12 weeks   PT Treatment/Interventions Aquatic Therapy;Moist Heat;Iontophoresis 71m/ml Dexamethasone;Cryotherapy;Electrical Stimulation;Therapeutic exercise;Therapeutic activities;Neuromuscular re-education;Taping;Manual techniques;Dry needling;Biofeedback;Passive range of motion   PT Next Visit Plan Progress ROM and manual interventions per protocol    PT Home Exercise Plan Passive ER within designated guidelines, same for PROM flexion.    Consulted and Agree with Plan of Care Patient      Patient will benefit from skilled therapeutic intervention in order to improve the following deficits and impairments:  Pain, Improper  body mechanics, Decreased range of motion, Decreased strength, Impaired UE functional use, Decreased activity tolerance, Decreased knowledge of precautions  Visit Diagnosis: Muscle weakness (generalized)  Chronic left shoulder pain     Problem List Patient Active Problem List   Diagnosis Date Noted  . Trochanteric bursitis, left hip 02/01/2016  . Pre-diabetes 08/05/2015  . Seasonal allergies 08/05/2015  . Elevated lipids 08/05/2015  . Chest tightness 06/11/2015  . Cutaneous skin tags 01/05/2015  . GERD without esophagitis 12/07/2014  . BPH (benign prostatic hyperplasia) 12/07/2014  . Diastolic CHF ([facility] 083/38/2505 . Shortness of breath 06/09/2013  . H/O mitral valve repair 06/03/2012  . Heart  palpitations 06/03/2012  . Dizziness 06/03/2012   Royce Macadamia PT, DPT, CSCS    02/08/2017, 10:00 AM  [facility] [address] [place], [address] Phone: [phone]   Fax:  [phone]  Name: DIONISIOS RICCI MRN: [id number] Date of Birth: Apr 11, 1956

## 2017-02-12 ENCOUNTER — Ambulatory Visit: Payer: 59 | Admitting: Physical Therapy

## 2017-02-12 ENCOUNTER — Encounter (INDEPENDENT_AMBULATORY_CARE_PROVIDER_SITE_OTHER): Payer: 59 | Admitting: Podiatry

## 2017-02-12 DIAGNOSIS — M25512 Pain in left shoulder: Secondary | ICD-10-CM | POA: Diagnosis not present

## 2017-02-12 DIAGNOSIS — M6281 Muscle weakness (generalized): Secondary | ICD-10-CM

## 2017-02-12 DIAGNOSIS — G8929 Other chronic pain: Secondary | ICD-10-CM | POA: Diagnosis not present

## 2017-02-12 NOTE — Patient Instructions (Signed)
AROM into flexion to 103 degrees   AAROM into flexion with PVC pipe with bilateral UEs x 10 repetitions in sitting x 8 before fatigue set in x 8 repetitions (cuing to not shrug)  Seated ER at 0 degrees abduction x 10 with red t-band for 3 sets ( no pain while completing)   AROM to 108 degrees with no shrugging observed   AAROM with PT with PVC through full ROM  Incline chest press with 2# DB x 15, with 3# DB x 12 for 2 sets   Single arm rows in standing on cable machine 5# x 10 repetitions for 3 sets until visible change in technique due to fatigue

## 2017-02-12 NOTE — Progress Notes (Signed)
This encounter was created in error - please disregard.

## 2017-02-13 NOTE — Therapy (Signed)
[facility] [address], [place], [address] Phone: [phone]   Fax:  [phone]  Physical Therapy Treatment  Patient Details  Name: Austin Stewart MRN: [id number] Date of Birth: June 15, 1955 Referring Provider: Dr. Ricardo Jericho  Encounter Date: 02/12/2017      PT End of Session - 02/13/17 1003    Visit Number 26   Number of Visits 35   Date for PT Re-Evaluation 03/29/17   PT Start Time 7544   PT Stop Time 1745   PT Time Calculation (min) 40 min   Activity Tolerance Patient tolerated treatment well   Behavior During Therapy [facility] for tasks assessed/performed      Past Medical History:  Diagnosis Date  . Allergic rhinitis, cause unspecified   . Allergy   . Cardiac dysrhythmia, unspecified   . Diverticulosis of colon (without mention of hemorrhage)   . Dysrhythmia   . Esophageal reflux   . Measles without mention of complication   . Mumps without mention of complication   . Osteoarthrosis, unspecified whether generalized or localized, unspecified site   . 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 Unspecified hyperplasia of prostate without urinary obstruction and other lower urinary tract symptoms (LUTS)     Past Surgical History:  Procedure Laterality Date  . CARDIAC CATHETERIZATION  2006   [facility]   . HERNIA REPAIR    . MITRAL VALVE REPAIR    . rotator cuff repair    . SHOULDER ARTHROSCOPY Left 08/24/2016   Procedure: ARTHROSCOPY SHOULDER with debridement;  Surgeon: Thornton Park, MD;  Location: [facility] ORS;  Service: Orthopedics;  Laterality: Left;  . TONSILLECTOMY      There were no vitals filed for this visit.      Subjective Assessment - 02/12/17 1711    Subjective Patient reports he still gets some fatigue/discomfort in musculature after therapy sessions and HEP, however it alleviates and he is not having the deep joint pain he was having pre-op.     Patient is accompained by: Family member   Pertinent History Previously failed rotator cuff repair from years ago, biceps tenodesis performed years ago in that procedure.    Limitations Lifting;House hold activities   Diagnostic tests Intra-operative findings indicative of split cuff tendon and full tear of infraspinatus and supraspinatus   Patient Stated Goals To return to full work duties.    Currently in Pain? No/denies        AROM into flexion to 103 degrees   AAROM into flexion with PVC pipe with bilateral UEs x 10 repetitions in sitting x 8 before fatigue set in x 8 repetitions (cuing to not shrug)  Seated ER at 0 degrees abduction x 10 with red t-band for 3 sets ( no pain while completing)   AROM to 108 degrees with no shrugging observed   AAROM with PT with PVC through full ROM  Incline chest press with 2# DB x 15, with 3# DB x 12 for 2 sets   Single arm rows in standing on cable machine 5# x 10 repetitions for 3 sets until visible change in technique due to fatigue                           PT Education - 02/13/17 1002    Education provided Yes   Education Details Advised patient to begin AAROM in sitting to increase  strength against gravity.    Person(s) Educated Patient   Methods Explanation;Demonstration   Comprehension Verbalized understanding;Returned demonstration             PT Long Term Goals - 02/05/17 1259      PT LONG TERM GOAL #1   Title Patient will demonstrate at least 150 degrees of active shoulder flexion to perform overhead activities   Baseline ~100 degrees    Time 16   Period Weeks   Status Partially Met     PT LONG TERM GOAL #2   Title Patient will be able to perform light lifting duties (10# or less) at work to complete work related duties with no increase in pain.    Baseline Unable - precautions    Time 16   Period Weeks   Status On-going     PT LONG TERM GOAL #3   Title Patient will report worst pain of  no more than 2/10 in LUE to demonstrate improved tolerance for work related activities.    Baseline No pain currently, but multi-modal use of pain medications/interventions.    Time 16   Period Weeks   Status Partially Met     PT LONG TERM GOAL #4   Title Patient will report Quick Dash score of less than 25% to demonstrate improved tolerance for ADLs.    Baseline Did not complete due to immobilization.    Time 16   Period Weeks   Status On-going               Plan - 02/13/17 1003    Clinical Impression Statement Patient continues to improve tolerance to resistance and anti-gravity range of motion. He has significant atrophy of his LUE musculature still, though he has increasing functional strength (able to lift cups of coffee). He is progressing appropriately and is not having pain, thus will continue to work on building ROM and strength.    Clinical Presentation Stable   Clinical Decision Making Moderate   Rehab Potential Good   Clinical Impairments Affecting Rehab Potential Significant repair, 3rd arthroscopic shoulder sx    PT Frequency 2x / week   PT Duration 12 weeks   PT Treatment/Interventions Aquatic Therapy;Moist Heat;Iontophoresis 6m/ml Dexamethasone;Cryotherapy;Electrical Stimulation;Therapeutic exercise;Therapeutic activities;Neuromuscular re-education;Taping;Manual techniques;Dry needling;Biofeedback;Passive range of motion   PT Next Visit Plan Progress ROM and manual interventions per protocol    PT Home Exercise Plan Passive ER within designated guidelines, same for PROM flexion.    Consulted and Agree with Plan of Care Patient      Patient will benefit from skilled therapeutic intervention in order to improve the following deficits and impairments:  Pain, Improper body mechanics, Decreased range of motion, Decreased strength, Impaired UE functional use, Decreased activity tolerance, Decreased knowledge of precautions  Visit Diagnosis: Muscle weakness  (generalized)  Chronic left shoulder pain     Problem List Patient Active Problem List   Diagnosis Date Noted  . Trochanteric bursitis, left hip 02/01/2016  . Pre-diabetes 08/05/2015  . Seasonal allergies 08/05/2015  . Elevated lipids 08/05/2015  . Chest tightness 06/11/2015  . Cutaneous skin tags 01/05/2015  . GERD without esophagitis 12/07/2014  . BPH (benign prostatic hyperplasia) 12/07/2014  . Diastolic CHF ([facility] 040/01/2724 . Shortness of breath 06/09/2013  . H/O mitral valve repair 06/03/2012  . Heart palpitations 06/03/2012  . Dizziness 06/03/2012   PRoyce MacadamiaPT, DPT, CSCS    02/13/2017, 10:12 AM  [facility] [address]  [place], [place], [address] Phone: [phone]   Fax:  [phone]  Name: Austin Stewart MRN: [id number] Date of Birth: Sep 16, 1955

## 2017-02-19 ENCOUNTER — Encounter: Payer: Self-pay | Admitting: Podiatry

## 2017-02-19 ENCOUNTER — Ambulatory Visit (INDEPENDENT_AMBULATORY_CARE_PROVIDER_SITE_OTHER): Payer: 59

## 2017-02-19 ENCOUNTER — Ambulatory Visit (INDEPENDENT_AMBULATORY_CARE_PROVIDER_SITE_OTHER): Payer: 59 | Admitting: Podiatry

## 2017-02-19 DIAGNOSIS — M21621 Bunionette of right foot: Secondary | ICD-10-CM

## 2017-02-19 DIAGNOSIS — M205X1 Other deformities of toe(s) (acquired), right foot: Secondary | ICD-10-CM

## 2017-02-19 NOTE — Patient Instructions (Signed)
Pre-Operative Instructions  Congratulations, you have decided to take an important step towards improving your quality of life.  You can be assured that the doctors and staff at [facility] will be with you every step of the way.  Here are some important things you should know:  1. Plan to be at the surgery center/hospital at least 1 (one) hour prior to your scheduled time, unless otherwise directed by the surgical center/hospital staff.  You must have a responsible adult accompany you, remain during the surgery and drive you home.  Make sure you have directions to the surgical center/hospital to ensure you arrive on time. 2. If you are having surgery at [facility] or [facility], you will need a copy of your medical history and physical form from your family physician within one month prior to the date of surgery. We will give you a form for your primary physician to complete.  3. We make every effort to accommodate the date you request for surgery.  However, there are times where surgery dates or times have to be moved.  We will contact you as soon as possible if a change in schedule is required.   4. No aspirin/ibuprofen for one week before surgery.  If you are on aspirin, any non-steroidal anti-inflammatory medications (Mobic, Aleve, Ibuprofen) should not be taken seven (7) days prior to your surgery.  You make take Tylenol for pain prior to surgery.  5. Medications - If you are taking daily heart and blood pressure medications, seizure, reflux, allergy, asthma, anxiety, pain or diabetes medications, make sure you notify the surgery center/hospital before the day of surgery so they can tell you which medications you should take or avoid the day of surgery. 6. No food or drink after midnight the night before surgery unless directed otherwise by surgical center/hospital staff. 7. No alcoholic beverages 24-hours prior to surgery.  No smoking 24-hours prior or 24-hours after  surgery. 8. Wear loose pants or shorts. They should be loose enough to fit over bandages, boots, and casts. 9. Don't wear slip-on shoes. Sneakers are preferred. 10. Bring your boot with you to the surgery center/hospital.  Also bring crutches or a walker if your physician has prescribed it for you.  If you do not have this equipment, it will be provided for you after surgery. 11. If you have not been contacted by the surgery center/hospital by the day before your surgery, call to confirm the date and time of your surgery. 12. Leave-time from work may vary depending on the type of surgery you have.  Appropriate arrangements should be made prior to surgery with your employer. 13. Prescriptions will be provided immediately following surgery by your doctor.  Fill these as soon as possible after surgery and take the medication as directed. Pain medications will not be refilled on weekends and must be approved by the doctor. 14. Remove nail polish on the operative foot and avoid getting pedicures prior to surgery. 15. Wash the night before surgery.  The night before surgery wash the foot and leg well with water and the antibacterial soap provided. Be sure to pay special attention to beneath the toenails and in between the toes.  Wash for at least three (3) minutes. Rinse thoroughly with water and dry well with a towel.  Perform this wash unless told not to do so by your physician.  Enclosed: 1 Ice pack (please put in freezer the night before surgery)   1 Hibiclens skin cleaner     Pre-op instructions  If you have any questions regarding the instructions, please do not hesitate to call our office.  [place]: [address], [place], [place] [address] -- [phone]  [place]: [address] [place], [place] [address] -- [phone]  [place]: [address]  [place], [place] [address] -- [phone]  [place]: [address], [address], [place], [place] [address] -- [phone]  Website:  [email or web address] 

## 2017-02-20 DIAGNOSIS — H524 Presbyopia: Secondary | ICD-10-CM | POA: Diagnosis not present

## 2017-02-21 ENCOUNTER — Telehealth: Payer: Self-pay | Admitting: *Deleted

## 2017-02-21 NOTE — Progress Notes (Signed)
He presents today with a chief complaint of painful first metatarsophalangeal joint and fifth metatarsophalangeal joint. He states that he would like to consider having these fixed because they're affecting his ability to perform his daily activities. He states that his lifestyle limiting painful with shoe gear and he cannot be as active as he would like to be. He feels that the lack of motion in the great toe is resulting in pain in the lateral aspect of the foot as well. He states that he has had surgery on the great toe right foot previously to clean the joint up.  Objective: Vital signs are stable he is alert and oriented 3. We have discussed etiology pathology conservative versus surgical therapies would also discuss his past medical history medications allergies surgeries and social history.  His pulses are palpable. Neurologic sensorium is intact deep tendon reflexes are intact muscle strength is +5/5 dorsiflexion plantar flexors and inverters everters all intrinsic musculature appears to be intact. Orthopedic evaluation however does demonstrate severely limited range of motion of the first metatarsophalangeal joint with a large nodularity of the joint. It is nonpulsatile in nature. He also has a large tailor's bunion deformity which is painful on palpation. The areas are mildly erythematous and mildly edematous.  Radiographs are taken today of the right foot 3 views in the office. These demonstrate an osseously mature individual with severe osteoarthritic changes to the head of the first metatarsal and the base of the proximal phalanx at the level of the first metatarsophalangeal joint. This is severe osteoarthritic change with no joint space whatsoever. Dorsal spurring is noted hypertrophy of the sesamoids are noted subchondral sclerosis and eburnation is also noted. Tailor's bunion deformity is present lateral aspect of the right foot simply due to lateral deviation of the fifth metatarsal and an  increase in the fourth intermetatarsal angle.  Assessment: Painful hallux rigidus first metatarsophalangeal joint right foot. Painful tailor's bunion deformity right foot.  Plan: We discussed the etiology pathology conservative versus surgical therapies we talked about fusion of the joint versus an arthroplasty of the joint versus a joint replacement. He understands this and is amenable to it. We also discussed correction of the talus bunion deformity with fifth metatarsal osteotomy. At this point we consented him for a Keller arthroplasty with a single silicone implant first metatarsophalangeal joint of the right foot. We also consented him for simultaneous fifth metatarsal osteotomy with double screw fixation. He understands this and is amenable to it and would like to proceed with surgery immediately. We did discuss the possible postop complications which may include but are not limited to postop pain bleeding swelling infection recurrence and need for further surgery overcorrection under correction rejection of the implant loss of digit loss of limb loss of life. We dispensed a cam walker today for postop ambulation we also discussed [place]. We also discussed the type of anesthesia and the sciatic block that he will receive. I will follow-up with him in the near future for surgical intervention.

## 2017-02-21 NOTE — Telephone Encounter (Signed)
"  I'm a patient of Dr. Geryl RankinsHyatt's.  I'd like to schedule my surgery."  Do you have a date in mind?  "I'd like to do it either December 7 or 14."  He can do either/or date.  "Let's schedule it for 04/06/2017."  I'll get it scheduled.  You can register with the surgical center now.  Registration instructions are in the brochure that was given to you.  Someone from the surgical center will call with the arrival time a day or two prior to surgery date.

## 2017-02-27 ENCOUNTER — Encounter: Payer: Self-pay | Admitting: Urology

## 2017-02-27 ENCOUNTER — Ambulatory Visit (INDEPENDENT_AMBULATORY_CARE_PROVIDER_SITE_OTHER): Payer: 59 | Admitting: Urology

## 2017-02-27 VITALS — BP 125/81 | HR 66 | Ht 70.0 in | Wt 192.7 lb

## 2017-02-27 DIAGNOSIS — N400 Enlarged prostate without lower urinary tract symptoms: Secondary | ICD-10-CM | POA: Diagnosis not present

## 2017-02-27 DIAGNOSIS — R3915 Urgency of urination: Secondary | ICD-10-CM | POA: Diagnosis not present

## 2017-02-27 DIAGNOSIS — R3912 Poor urinary stream: Secondary | ICD-10-CM | POA: Diagnosis not present

## 2017-02-27 NOTE — Progress Notes (Signed)
02/27/2017 4:07 PM   Austin Stewart 04/15/1956 [id number]  Referring provider: Smitty CordsKaramalegos, Alexander J, DO [address] [place], [place] [id number]  Chief Complaint  Patient presents with  . Benign Prostatic Hypertrophy    HPI:  F/u -   1) ED -- SHIM score is 15. He tried Viagra and it worked well.   2) BPH - he has BPH with frequency, urgency, weak stream and straining. He's had that for years. He has a weak stream if he misses a dose and a weak stream in the evenings (he takes tamsulosin in AM). He had a recent episode of left testicle pain and took Cipro. Pain improved. No vasectomy. PSA was 0.04 Jun 2016. Normal DRE Aug 2018. He started tamsulosin BID due to straining, hesitnacy and frequency.  He returns and started tamsulosin BID. He has hesitancy and frequency. Occasional dysuria.   PMH: Past Medical History:  Diagnosis Date  . Allergic rhinitis, cause unspecified   . Allergy   . Cardiac dysrhythmia, unspecified   . Diverticulosis of colon (without mention of hemorrhage)   . Dysrhythmia   . Esophageal reflux   . Measles without mention of complication   . Mumps without mention of complication   . Osteoarthrosis, unspecified whether generalized or localized, unspecified site   . 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 Unspecified hyperplasia of prostate without urinary obstruction and other lower urinary tract symptoms (LUTS)     Surgical History: Past Surgical History:  Procedure Laterality Date  . CARDIAC CATHETERIZATION  2006   [facility]   . HERNIA REPAIR    . MITRAL VALVE REPAIR    . rotator cuff repair    . SHOULDER ARTHROSCOPY Left 08/24/2016   Procedure: ARTHROSCOPY SHOULDER with debridement;  Surgeon: Juanell FairlyKevin Krasinski, MD;  Location: [facility] ORS;  Service: Orthopedics;  Laterality: Left;  . TONSILLECTOMY      Home Medications:  Allergies as of 02/27/2017      Reactions   Other Swelling   PEACHES  SWELLING OF FACE      Medication List       Accurate as of 02/27/17  4:07 PM. Always use your most recent med list.          aspirin 81 MG tablet Take 81 mg by mouth daily.   [facility] EYE ALLERGY RELIEF 0.025-0.3 % ophthalmic solution Generic drug:  naphazoline-pheniramine Place 1 drop into both eyes 4 (four) times daily as needed for irritation.   flecainide 50 MG tablet Commonly known as:  TAMBOCOR TAKE 1 TABLET BY MOUTH 2 TIMES DAILY.   fluticasone 50 MCG/ACT nasal spray Commonly known as:  FLONASE Place 2 sprays into both nostrils daily. Dispense 3 bottles.   loratadine 10 MG tablet Commonly known as:  CLARITIN Take 1 tablet (10 mg total) by mouth daily.   metoprolol succinate 25 MG 24 hr tablet Commonly known as:  TOPROL-XL TAKE 1 TABLET BY MOUTH DAILY.   montelukast 10 MG tablet Commonly known as:  SINGULAIR Take 1 tablet (10 mg total) by mouth at bedtime.   multivitamin with minerals tablet Take 1 tablet by mouth daily.   omeprazole 20 MG capsule Commonly known as:  PRILOSEC TAKE 1 CAPSULE BY MOUTH DAILY.   sildenafil 20 MG tablet Commonly known as:  REVATIO Take 1-5 pills about 30 min prior to sex. Start with 1 and increase as needed.   sodium chloride 0.65 % Soln nasal spray Commonly known  as:  OCEAN Place 1 spray into both nostrils as needed for congestion.   tamsulosin 0.4 MG Caps capsule Commonly known as:  FLOMAX Take 1 capsule (0.4 mg total) by mouth 2 (two) times daily.   Turmeric 500 MG Caps Take 2 capsules by mouth daily.   Zoster Vaccine Adjuvanted injection Commonly known as:  SHINGRIX Inject 0.5 mLs into the muscle every 6 (six) months. For series of 2 injections total       Allergies:  Allergies  Allergen Reactions  . Other Swelling    PEACHES  SWELLING OF FACE    Family History: Family History  Problem Relation Age of Onset  . Heart disease Mother        s/p stent placement  . Hypertension Mother   . Emphysema Father   . Dementia Father   .  Cancer Maternal Uncle        kidney cancer    Social History:  reports that he has never smoked. He has never used smokeless tobacco. He reports that he does not drink alcohol or use drugs.  ROS: UROLOGY Frequent Urination?: Yes Hard to postpone urination?: Yes Burning/pain with urination?: Yes Get up at night to urinate?: Yes Leakage of urine?: No Urine stream starts and stops?: No Trouble starting stream?: No Do you have to strain to urinate?: Yes Blood in urine?: Yes Urinary tract infection?: No Sexually transmitted disease?: No Injury to kidneys or bladder?: No Painful intercourse?: No Weak stream?: Yes Erection problems?: Yes Penile pain?: No  Gastrointestinal Nausea?: No Vomiting?: No Indigestion/heartburn?: No Diarrhea?: No Constipation?: No  Constitutional Fever: No Night sweats?: No Weight loss?: No Fatigue?: No  Skin Skin rash/lesions?: No Itching?: No  Eyes Blurred vision?: No Double vision?: No  Ears/Nose/Throat Sore throat?: No Sinus problems?: No  Hematologic/Lymphatic Swollen glands?: No Easy bruising?: No  Cardiovascular Leg swelling?: No Chest pain?: No  Respiratory Cough?: No Shortness of breath?: No  Endocrine Excessive thirst?: No  Musculoskeletal Back pain?: No Joint pain?: No  Neurological Headaches?: No Dizziness?: No  Psychologic Depression?: No Anxiety?: No  Physical Exam: BP 125/81 (BP Location: Right Arm, Patient Position: Sitting, Cuff Size: Normal)   Pulse 66   Ht 5\' 10"  (1.778 m)   Wt 87.4 kg (192 lb 11.2 oz)   BMI 27.65 kg/m   Constitutional:  Alert and oriented, No acute distress. HEENT:  AT, moist mucus membranes.  Trachea midline, no masses. Cardiovascular: No clubbing, cyanosis, or edema. Respiratory: Normal respiratory effort, no increased work of breathing. GI: Abdomen is soft, nontender, nondistended, no abdominal masses GU: No CVA tenderness.  Skin: No rashes, bruises or suspicious  lesions. Neurologic: Grossly intact, no focal deficits, moving all 4 extremities. Psychiatric: Normal mood and affect.  Laboratory Data: Lab Results  Component Value Date   WBC 8.4 08/21/2016   HGB 15.0 08/21/2016   HCT 43.3 08/21/2016   MCV 87.6 08/21/2016   PLT 372 08/21/2016    Lab Results  Component Value Date   CREATININE 1.02 08/21/2016    Lab Results  Component Value Date   PSA1 0.4 12/18/2014    No results found for: TESTOSTERONE  Lab Results  Component Value Date   HGBA1C 5.2 10/26/2016    Urinalysis No results found for: SPECGRAV, PHUR, COLORU, APPEARANCEUR, LEUKOCYTESUR, PROTEINUR, GLUCOSEU, KETONESU, RBCU, BILIRUBINUR, UUROB, NITRITE  No results found for: LABMICR, WBCUA, RBCUA, LABEPIT, MUCUS, BACTERIA  No results found for this or any previous visit. No results found for this or any  previous visit. No results found for this or any previous visit. No results found for this or any previous visit. No results found for this or any previous visit. No results found for this or any previous visit. No results found for this or any previous visit. No results found for this or any previous visit.  Assessment & Plan:   BPH with LUTS - he continues to have hesitancy, urgency, frequency and weak stream. We discussed finasteride. He'll consider. He'll continue tamsulosin BID and f/u for PVR and cystoscopy.    There are no diagnoses linked to this encounter.  No Follow-up on file.  Jerilee Field, MD  [facility] [address], [address] [place], [place] [address] [phone]

## 2017-02-28 ENCOUNTER — Ambulatory Visit: Payer: 59 | Admitting: Physical Therapy

## 2017-02-28 DIAGNOSIS — M6281 Muscle weakness (generalized): Secondary | ICD-10-CM | POA: Diagnosis not present

## 2017-02-28 DIAGNOSIS — M25512 Pain in left shoulder: Secondary | ICD-10-CM | POA: Diagnosis not present

## 2017-02-28 DIAGNOSIS — G8929 Other chronic pain: Secondary | ICD-10-CM | POA: Diagnosis not present

## 2017-02-28 NOTE — Patient Instructions (Signed)
AROM into forward elevation to 123 degrees   90-90 Position performing ER with BW x 12 (felt in roughly the right spot), with 1# weight x 10 repetitions, 8 repetitions (before he began compensating)   Prone horizontal abduction with ER (bodyweight only -- 3x10 through appropriate ROM)   Prone rows with 1# x 12 (too easy), 2# x 12, 3# x 12 (more appropriate resistance)

## 2017-02-28 NOTE — Therapy (Signed)
[facility] [address] [place], [address] Phone: [phone]   Fax:  [phone]  Physical Therapy Treatment  Patient Details  Name: Austin Stewart MRN: [id number] Date of Birth: [birth year] Referring Provider: Dr. Ricardo Jericho  Encounter Date: 02/28/2017      PT End of Session - 02/28/17 0836    Visit Number 27   Number of Visits 35   Date for PT Re-Evaluation 03/29/17   PT Start Time 0735   PT Stop Time 0815   PT Time Calculation (min) 40 min   Activity Tolerance Patient tolerated treatment well   Behavior During Therapy [facility] for tasks assessed/performed      Past Medical History:  Diagnosis Date  . Allergic rhinitis, cause unspecified   . Allergy   . Cardiac dysrhythmia, unspecified   . Diverticulosis of colon (without mention of hemorrhage)   . Dysrhythmia   . Esophageal reflux   . Measles without mention of complication   . Mumps without mention of complication   . Osteoarthrosis, unspecified whether generalized or localized, unspecified site   . 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 Unspecified hyperplasia of prostate without urinary obstruction and other lower urinary tract symptoms (LUTS)     Past Surgical History:  Procedure Laterality Date  . CARDIAC CATHETERIZATION  2006   [facility]   . HERNIA REPAIR    . MITRAL VALVE REPAIR    . rotator cuff repair    . SHOULDER ARTHROSCOPY Left 08/24/2016   Procedure: ARTHROSCOPY SHOULDER with debridement;  Surgeon: Thornton Park, MD;  Location: [facility] ORS;  Service: Orthopedics;  Laterality: Left;  . TONSILLECTOMY      There were no vitals filed for this visit.      Subjective Assessment - 02/28/17 0736    Subjective Patient reports he still gets popping every now and again. He reports he is sleeping well and not having pain with walking.    Patient is accompained by: Family member   Pertinent  History Previously failed rotator cuff repair from years ago, biceps tenodesis performed years ago in that procedure.    Limitations Lifting;House hold activities   Diagnostic tests Intra-operative findings indicative of split cuff tendon and full tear of infraspinatus and supraspinatus   Patient Stated Goals To return to full work duties.    Currently in Pain? No/denies      AROM into forward elevation to 123 degrees   90-90 Position performing ER with BW x 12 (felt in roughly the right spot), with 1# weight x 10 repetitions, 8 repetitions (before he began compensating)   Prone horizontal abduction with ER (bodyweight only -- 3x10 through appropriate ROM)   Prone rows with 1# x 12 (too easy), 2# x 12, 3# x 12 (more appropriate resistance)   Seated rows on OMEGA with 10# (using bilateral UE) x 12, 20# x 10 (reports he felt popping sensation in the UT region on L, educated that it would be preferable to do higher reps for this at home given his response.                            PT Education - 02/28/17 0835    Education provided Yes   Education Details Updated HEP and progress with ROM.    Person(s) Educated Patient   Methods Explanation;Demonstration   Comprehension Verbalized understanding;Returned demonstration  PT Long Term Goals - 02/05/17 1259      PT LONG TERM GOAL #1   Title Patient will demonstrate at least 150 degrees of active shoulder flexion to perform overhead activities   Baseline ~100 degrees    Time 16   Period Weeks   Status Partially Met     PT LONG TERM GOAL #2   Title Patient will be able to perform light lifting duties (10# or less) at work to complete work related duties with no increase in pain.    Baseline Unable - precautions    Time 16   Period Weeks   Status On-going     PT LONG TERM GOAL #3   Title Patient will report worst pain of no more than 2/10 in LUE to demonstrate improved tolerance for work  related activities.    Baseline No pain currently, but multi-modal use of pain medications/interventions.    Time 16   Period Weeks   Status Partially Met     PT LONG TERM GOAL #4   Title Patient will report Quick Dash score of less than 25% to demonstrate improved tolerance for ADLs.    Baseline Did not complete due to immobilization.    Time 16   Period Weeks   Status On-going               Plan - 02/28/17 0837    Clinical Impression Statement Patient continues to have some popping/clicking sensations though per his discussion with orthopedist this is likely scar tissue, he is not having pain associated with it. He tolerates strengthening progressions appropriately and is able to properly activate peri-scapular musculature without pinching sensation in the front. His AROM continues to improve in each session.    Clinical Presentation Stable   Clinical Decision Making Moderate   Rehab Potential Good   Clinical Impairments Affecting Rehab Potential Significant repair, 3rd arthroscopic shoulder sx    PT Frequency 2x / week   PT Duration 12 weeks   PT Treatment/Interventions Aquatic Therapy;Moist Heat;Iontophoresis 20m/ml Dexamethasone;Cryotherapy;Electrical Stimulation;Therapeutic exercise;Therapeutic activities;Neuromuscular re-education;Taping;Manual techniques;Dry needling;Biofeedback;Passive range of motion   PT Next Visit Plan Progress ROM and manual interventions per protocol    PT Home Exercise Plan Passive ER within designated guidelines, same for PROM flexion.    Consulted and Agree with Plan of Care Patient      Patient will benefit from skilled therapeutic intervention in order to improve the following deficits and impairments:  Pain, Improper body mechanics, Decreased range of motion, Decreased strength, Impaired UE functional use, Decreased activity tolerance, Decreased knowledge of precautions  Visit Diagnosis: Muscle weakness (generalized)  Chronic left  shoulder pain     Problem List Patient Active Problem List   Diagnosis Date Noted  . Trochanteric bursitis, left hip 02/01/2016  . Pre-diabetes 08/05/2015  . Seasonal allergies 08/05/2015  . Elevated lipids 08/05/2015  . Chest tightness 06/11/2015  . Cutaneous skin tags 01/05/2015  . GERD without esophagitis 12/07/2014  . BPH (benign prostatic hyperplasia) 12/07/2014  . Diastolic CHF ([place] 093/81/0175 . Shortness of breath 06/09/2013  . H/O mitral valve repair 06/03/2012  . Heart palpitations 06/03/2012  . Dizziness 06/03/2012   PRoyce MacadamiaPT, DPT, CSCS    02/28/2017, 8:39 AM  [facility] [address] [place] [address]Phone: [phone]  Fax:  [phone] Name: Austin CHRISTOPHERSONMRN: [id number] of Birth: 811-12-1955

## 2017-03-06 ENCOUNTER — Ambulatory Visit: Payer: 59 | Attending: Orthopaedic Surgery | Admitting: Physical Therapy

## 2017-03-06 DIAGNOSIS — G8929 Other chronic pain: Secondary | ICD-10-CM | POA: Diagnosis not present

## 2017-03-06 DIAGNOSIS — M6281 Muscle weakness (generalized): Secondary | ICD-10-CM | POA: Insufficient documentation

## 2017-03-06 DIAGNOSIS — M25512 Pain in left shoulder: Secondary | ICD-10-CM | POA: Diagnosis not present

## 2017-03-06 NOTE — Patient Instructions (Signed)
AROM - 133 degrees   AROM in supine to 163 degrees   AAROM  Seated ER with red t-band x 15 repetitions (mild to moderate challenge)

## 2017-03-07 NOTE — Therapy (Signed)
[facility] [address] [place], [address] Phone: [phone]   Fax:  [phone]  Physical Therapy Treatment  Patient Details  Name: Austin Stewart MRN: [id number] Date of Birth: [birth year] Referring Provider: Dr. Ricardo Jericho   Encounter Date: 03/06/2017  PT End of Session - 03/06/17 1652    Visit Number  28    Number of Visits  35    Date for PT Re-Evaluation  03/29/17    PT Start Time  1651    PT Stop Time  1718    PT Time Calculation (min)  27 min    Activity Tolerance  Patient tolerated treatment well    Behavior During Therapy  [facility] for tasks assessed/performed       Past Medical History:  Diagnosis Date  . Allergic rhinitis, cause unspecified   . Allergy   . Cardiac dysrhythmia, unspecified   . Diverticulosis of colon (without mention of hemorrhage)   . Dysrhythmia   . Esophageal reflux   . Measles without mention of complication   . Mumps without mention of complication   . Osteoarthrosis, unspecified whether generalized or localized, unspecified site   . 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 Unspecified hyperplasia of prostate without urinary obstruction and other lower urinary tract symptoms (LUTS)     Past Surgical History:  Procedure Laterality Date  . CARDIAC CATHETERIZATION  2006   [facility]   . HERNIA REPAIR    . MITRAL VALVE REPAIR    . rotator cuff repair    . TONSILLECTOMY      There were no vitals filed for this visit.  Subjective Assessment - 03/06/17 1652    Subjective  Patient reports his main limitation is still his ROM. He is not having pain per se, and is able to complete his ADLs.     Patient is accompained by:  Family member    Pertinent History  Previously failed rotator cuff repair from years ago, biceps tenodesis performed years ago in that procedure.     Limitations  Lifting;House hold activities    Diagnostic tests   Intra-operative findings indicative of split cuff tendon and full tear of infraspinatus and supraspinatus    Patient Stated Goals  To return to full work duties.     Currently in Pain?  No/denies       AROM - 133 degrees   AROM in supine to 163 degrees   AAROM with 2# weight on PVC pipe x 10 for 2 sets   Single leg bridge x 10 per side with hip abductions to develop core strength and stability to minimize demand on anterior shoulder complex with functional activities   Palloff rotational press x 12 with green t-band with RUE only on the band bilaterally with appropriate core challenge.   Seated ER with red t-band x 15 repetitions (mild to moderate challenge)                         PT Education - 03/07/17 0936    Education provided  Yes    Education Details  Updated HEP, need to work on strength against gravity for restoration of AROM, without UT involvement.     Person(s) Educated  Patient    Methods  Explanation;Demonstration;Handout    Comprehension  Verbalized understanding;Returned demonstration          PT Long Term  Goals - 02/05/17 1259      PT LONG TERM GOAL #1   Title  Patient will demonstrate at least 150 degrees of active shoulder flexion to perform overhead activities    Baseline  ~100 degrees     Time  16    Period  Weeks    Status  Partially Met      PT LONG TERM GOAL #2   Title  Patient will be able to perform light lifting duties (10# or less) at work to complete work related duties with no increase in pain.     Baseline  Unable - precautions     Time  16    Period  Weeks    Status  On-going      PT LONG TERM GOAL #3   Title  Patient will report worst pain of no more than 2/10 in LUE to demonstrate improved tolerance for work related activities.     Baseline  No pain currently, but multi-modal use of pain medications/interventions.     Time  16    Period  Weeks    Status  Partially Met      PT LONG TERM GOAL #4   Title   Patient will report Quick Dash score of less than 25% to demonstrate improved tolerance for ADLs.     Baseline  Did not complete due to immobilization.     Time  16    Period  Weeks    Status  On-going            Plan - 03/07/17 8242    Clinical Impression Statement  Patient has continued to make excellent progress with AROM against gravity with no increase in pain. He still demonstrates excessive UT activity in higher ranges of AROM, though decreasing with continued emphasis on LT and MT strength development. He is still not able to match his supine AROM, thus will follow up with therapy in 3-4 weeks for exercise progressions.     Clinical Presentation  Stable    Clinical Decision Making  Moderate    Rehab Potential  Good    Clinical Impairments Affecting Rehab Potential  Significant repair, 3rd arthroscopic shoulder sx     PT Frequency  2x / week    PT Duration  12 weeks    PT Treatment/Interventions  Aquatic Therapy;Moist Heat;Iontophoresis 84m/ml Dexamethasone;Cryotherapy;Electrical Stimulation;Therapeutic exercise;Therapeutic activities;Neuromuscular re-education;Taping;Manual techniques;Dry needling;Biofeedback;Passive range of motion    PT Next Visit Plan  Progress ROM and manual interventions per protocol     PT Home Exercise Plan  Passive ER within designated guidelines, same for PROM flexion.     Consulted and Agree with Plan of Care  Patient       Patient will benefit from skilled therapeutic intervention in order to improve the following deficits and impairments:  Pain, Improper body mechanics, Decreased range of motion, Decreased strength, Impaired UE functional use, Decreased activity tolerance, Decreased knowledge of precautions  Visit Diagnosis: Muscle weakness (generalized)  Chronic left shoulder pain     Problem List Patient Active Problem List   Diagnosis Date Noted  . Trochanteric bursitis, left hip 02/01/2016  . Pre-diabetes 08/05/2015  . Seasonal  allergies 08/05/2015  . Elevated lipids 08/05/2015  . Chest tightness 06/11/2015  . Cutaneous skin tags 01/05/2015  . GERD without esophagitis 12/07/2014  . BPH (benign prostatic hyperplasia) 12/07/2014  . Diastolic CHF ([place] 035/36/1443 . Shortness of breath 06/09/2013  . H/O mitral valve repair 06/03/2012  . Heart palpitations  06/03/2012  . Dizziness 06/03/2012   Royce Macadamia PT, DPT, CSCS    03/07/2017, 9:41 AM  [facility] [address], [place], [address] Phone: [phone]   Fax:  [phone]  Name: Austin Stewart MRN: [id number] Date of Birth: [birth year]

## 2017-03-13 ENCOUNTER — Ambulatory Visit: Payer: Self-pay | Admitting: Physician Assistant

## 2017-03-13 ENCOUNTER — Encounter: Payer: Self-pay | Admitting: Physician Assistant

## 2017-03-13 ENCOUNTER — Ambulatory Visit: Payer: 59 | Admitting: Physical Therapy

## 2017-03-13 VITALS — BP 110/84 | HR 72 | Temp 98.1°F

## 2017-03-13 DIAGNOSIS — J0100 Acute maxillary sinusitis, unspecified: Secondary | ICD-10-CM

## 2017-03-13 MED ORDER — AMOXICILLIN 875 MG PO TABS: 875.0000 mg | ORAL_TABLET | Freq: Two times a day (BID) | ORAL | 0 refills | Status: DC

## 2017-03-13 MED ORDER — FEXOFENADINE-PSEUDOEPHED ER 60-120 MG PO TB12: 1.0000 | ORAL_TABLET | Freq: Two times a day (BID) | ORAL | 0 refills | Status: DC

## 2017-03-13 NOTE — Progress Notes (Signed)
   Subjective: Sinus congestion     Patient ID: Austin MarrowRobert D Vara, male    DOB: 09/28/1955, 61 y.o.   MRN: [id number]  HPI Patient complaining of 2 weeks of nasal congestion, postnasal drainage and a yellowish thick productive cough. Patient also complained of frontal headache and ear pressure. Patient state using over-the-counter Mucinex with only mild relief.   Review of Systems Seasonal rhinitis    Objective:   Physical Exam HEENT is remarkable edematous nasal turbinates with bilateral maxillary or frontal guarding. Patient has postnasal drainage but pharynx is not erythematous. Neck is supple without adenopathy. Lungs are clear to auscultation. Heart regular rate and rhythm.       Assessment & Plan: Maxillary sinusitis   Patient given discharge care instructions. Patient given a prescription for amoxicillin and Allegra-D. Patient advised to follow-up PCP if no improvement in one week.

## 2017-03-19 ENCOUNTER — Encounter: Payer: Self-pay | Admitting: Urology

## 2017-03-19 ENCOUNTER — Ambulatory Visit: Payer: 59 | Admitting: Urology

## 2017-03-19 VITALS — BP 126/83 | HR 67 | Ht 70.0 in | Wt 192.8 lb

## 2017-03-19 DIAGNOSIS — N401 Enlarged prostate with lower urinary tract symptoms: Secondary | ICD-10-CM | POA: Diagnosis not present

## 2017-03-19 DIAGNOSIS — N138 Other obstructive and reflux uropathy: Secondary | ICD-10-CM

## 2017-03-19 DIAGNOSIS — R3912 Poor urinary stream: Secondary | ICD-10-CM

## 2017-03-19 LAB — URINALYSIS, COMPLETE
Bilirubin, UA: NEGATIVE
GLUCOSE, UA: NEGATIVE
Ketones, UA: NEGATIVE
LEUKOCYTES UA: NEGATIVE
Nitrite, UA: NEGATIVE
PROTEIN UA: NEGATIVE
RBC UA: NEGATIVE
SPEC GRAV UA: 1.01 (ref 1.005–1.030)
Urobilinogen, Ur: 0.2 mg/dL (ref 0.2–1.0)
pH, UA: 6.5 (ref 5.0–7.5)

## 2017-03-19 LAB — BLADDER SCAN AMB NON-IMAGING: Scan Result: 363

## 2017-03-19 MED ORDER — LIDOCAINE HCL 2 % EX GEL
1.0000 "application " | Freq: Once | CUTANEOUS | Status: AC
  Administered 2017-03-19: 1 via URETHRAL

## 2017-03-19 MED ORDER — CIPROFLOXACIN HCL 500 MG PO TABS: 500.0000 mg | ORAL_TABLET | Freq: Once | ORAL | Status: DC

## 2017-03-19 NOTE — Progress Notes (Signed)
   03/19/17  CC: No chief complaint on file.   HPI:  1) ED -- SHIM score is 15. He tried Viagra and it worked well.   2) BPH - he has BPH with frequency, urgency, weak stream and straining. He's had that for years. He has a weak stream if he misses a dose and a weak stream in the evenings (he takes tamsulosin in AM). He had a recent episode of left testicle pain and took Cipro. Pain improved. No vasectomy. PSA was 0.04 Jun 2016. Normal DRE Aug 2018. He started tamsulosin BID due to straining, hesitnacy and frequency which did not improve his LUTS.   He returns today for PVR and cystoscopy. His PVR is 363 ml.    There were no vitals taken for this visit. NED. A&Ox3.   No respiratory distress   Abd soft, NT, ND Normal phallus with bilateral descended testicles  Cystoscopy Procedure Note  Patient identification was confirmed, informed consent was obtained, and patient was prepped using Betadine solution.  Lidocaine jelly was administered per urethral meatus.    Preoperative abx where received prior to procedure.     Pre-Procedure: - Inspection reveals a normal caliber ureteral meatus.  Procedure: The flexible cystoscope was introduced without difficulty - No urethral strictures/lesions are present. - prostate - obstructing median lobe  - bladder neck patent  - Bilateral ureteral orifices identified - Bladder mucosa  reveals no ulcers, tumors, or lesions - No bladder stones - No trabeculation  Retroflexion shows normal bladder neck, knuckle of prostate tissue    Post-Procedure: - Patient tolerated the procedure well  Assessment/ Plan:  bph with obstructing median lobe, LUTS and elevated PVR - he'd do well with a TURP or Thulium laser prostate. Given the median lobe he may not do well with Urolift and the median lobe did not appear large enough to consider HoLEP. He is interested in surgical therapy. Given unique risks of prostate surgery I want him to follow-up with one of  my colleagues to discuss the nature, risks, benefits and alternatives again since they would be doing the procedure if he had something done.

## 2017-03-21 ENCOUNTER — Encounter: Payer: 59 | Admitting: Physical Therapy

## 2017-03-21 DIAGNOSIS — M629 Disorder of muscle, unspecified: Secondary | ICD-10-CM | POA: Diagnosis not present

## 2017-03-21 DIAGNOSIS — M75122 Complete rotator cuff tear or rupture of left shoulder, not specified as traumatic: Secondary | ICD-10-CM | POA: Diagnosis not present

## 2017-03-21 DIAGNOSIS — M7542 Impingement syndrome of left shoulder: Secondary | ICD-10-CM | POA: Diagnosis not present

## 2017-03-28 ENCOUNTER — Ambulatory Visit: Payer: Self-pay | Admitting: Emergency Medicine

## 2017-03-28 VITALS — BP 112/80 | HR 75 | Temp 98.6°F

## 2017-03-28 DIAGNOSIS — J0101 Acute recurrent maxillary sinusitis: Secondary | ICD-10-CM

## 2017-03-28 MED ORDER — SULFAMETHOXAZOLE-TRIMETHOPRIM 800-160 MG PO TABS: 1.0000 | ORAL_TABLET | Freq: Two times a day (BID) | ORAL | 0 refills | Status: DC

## 2017-03-28 MED ORDER — PREDNISONE 10 MG PO TABS: ORAL_TABLET | ORAL | 0 refills | Status: DC

## 2017-03-28 NOTE — Progress Notes (Signed)
S: States that the sinus pressure is starting back.  Just finished ? Augmentin.  Denies any fever or chills.  Cough productive.  Work last evening with facial pain. History of sinusitis. O:  tMs are dull bilaterally. Nose is wet and boggy. Posterior pharynx moderate injectiion seen.  There is some minimal tenderness on maxillary sinus areas bilat.  Lungs are clear.  Neck supple without aden.   A:  Unresolved sinusitis. P:  Patient is to begin taking Flonase daily. He was given a prescription for Bactrim DS twice a day for 10 days  and to begin on prednisone 30 mg for the next 4 days.  Consider ENT if not improving

## 2017-03-30 ENCOUNTER — Encounter: Payer: Self-pay | Admitting: Urology

## 2017-03-30 ENCOUNTER — Ambulatory Visit: Payer: 59 | Admitting: Urology

## 2017-03-30 VITALS — BP 130/83 | HR 85 | Ht 70.0 in | Wt 190.2 lb

## 2017-03-30 DIAGNOSIS — R3914 Feeling of incomplete bladder emptying: Secondary | ICD-10-CM

## 2017-03-30 DIAGNOSIS — N401 Enlarged prostate with lower urinary tract symptoms: Secondary | ICD-10-CM

## 2017-03-30 LAB — BLADDER SCAN AMB NON-IMAGING

## 2017-03-30 NOTE — Progress Notes (Signed)
03/30/2017 8:54 AM   Austin Stewart 08/22/1955 [id number]  Referring provider: Smitty CordsKaramalegos, Alexander J, DO [address] [place], [place] [id number]  Chief Complaint  Patient presents with  . Follow-up    discuss surgery    HPI: 61 y.o. male with BPH and incomplete bladder emptying.  He recently saw Dr. Mena GoesEskridge and had an obstructing intravesical median lobe.  His tamsulosin was increased to 0.8 mg.  He does feel he is emptying better.  He presents today to discuss out the surgery.   PMH: Past Medical History:  Diagnosis Date  . Allergic rhinitis, cause unspecified   . Allergy   . Cardiac dysrhythmia, unspecified   . Diverticulosis of colon (without mention of hemorrhage)   . Dysrhythmia   . Esophageal reflux   . Measles without mention of complication   . Mumps without mention of complication   . Osteoarthrosis, unspecified whether generalized or localized, unspecified site   . 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 Unspecified hyperplasia of prostate without urinary obstruction and other lower urinary tract symptoms (LUTS)     Surgical History: Past Surgical History:  Procedure Laterality Date  . CARDIAC CATHETERIZATION  2006   [facility]   . HERNIA REPAIR    . MITRAL VALVE REPAIR    . rotator cuff repair    . SHOULDER ARTHROSCOPY Left 08/24/2016   Procedure: ARTHROSCOPY SHOULDER with debridement;  Surgeon: Juanell FairlyKevin Krasinski, MD;  Location: [facility] ORS;  Service: Orthopedics;  Laterality: Left;  . TONSILLECTOMY      Home Medications:  Allergies as of 03/30/2017      Reactions   Other Swelling   PEACHES  SWELLING OF FACE      Medication List        Accurate as of 03/30/17  8:54 AM. Always use your most recent med list.          aspirin 81 MG tablet Take 81 mg by mouth daily.   [facility] EYE ALLERGY RELIEF 0.025-0.3 % ophthalmic solution Generic drug:  naphazoline-pheniramine Place 1 drop into both eyes 4 (four) times daily as  needed for irritation.   fexofenadine-pseudoephedrine 60-120 MG 12 hr tablet Commonly known as:  ALLEGRA-D Take 1 tablet 2 (two) times daily by mouth.   flecainide 50 MG tablet Commonly known as:  TAMBOCOR TAKE 1 TABLET BY MOUTH 2 TIMES DAILY.   fluticasone 50 MCG/ACT nasal spray Commonly known as:  FLONASE Place 2 sprays into both nostrils daily. Dispense 3 bottles.   loratadine 10 MG tablet Commonly known as:  CLARITIN Take 1 tablet (10 mg total) by mouth daily.   metoprolol succinate 25 MG 24 hr tablet Commonly known as:  TOPROL-XL TAKE 1 TABLET BY MOUTH DAILY.   montelukast 10 MG tablet Commonly known as:  SINGULAIR Take 1 tablet (10 mg total) by mouth at bedtime.   multivitamin with minerals tablet Take 1 tablet by mouth daily.   omeprazole 20 MG capsule Commonly known as:  PRILOSEC TAKE 1 CAPSULE BY MOUTH DAILY.   predniSONE 10 MG tablet Commonly known as:  DELTASONE Take 3 tablets every day for 4 days   sildenafil 20 MG tablet Commonly known as:  REVATIO Take 1-5 pills about 30 min prior to sex. Start with 1 and increase as needed.   sodium chloride 0.65 % Soln nasal spray Commonly known as:  OCEAN Place 1 spray into both nostrils as needed for congestion.   sulfamethoxazole-trimethoprim 800-160  MG tablet Commonly known as:  BACTRIM DS,SEPTRA DS Take 1 tablet by mouth 2 (two) times daily.   tamsulosin 0.4 MG Caps capsule Commonly known as:  FLOMAX Take 1 capsule (0.4 mg total) by mouth 2 (two) times daily.   Turmeric 500 MG Caps Take 2 capsules by mouth daily.   Zoster Vaccine Adjuvanted injection Commonly known as:  SHINGRIX Inject 0.5 mLs into the muscle every 6 (six) months. For series of 2 injections total       Allergies:  Allergies  Allergen Reactions  . Other Swelling    PEACHES  SWELLING OF FACE    Family History: Family History  Problem Relation Age of Onset  . Heart disease Mother        s/p stent placement  . Hypertension  Mother   . Emphysema Father   . Dementia Father   . Cancer Maternal Uncle        kidney cancer    Social History:  reports that  has never smoked. he has never used smokeless tobacco. He reports that he does not drink alcohol or use drugs.  ROS: UROLOGY Frequent Urination?: Yes Hard to postpone urination?: Yes Burning/pain with urination?: Yes Get up at night to urinate?: Yes Leakage of urine?: No Urine stream starts and stops?: Yes Trouble starting stream?: Yes Do you have to strain to urinate?: Yes Blood in urine?: No Urinary tract infection?: No Sexually transmitted disease?: No Injury to kidneys or bladder?: No Painful intercourse?: No Weak stream?: No Erection problems?: No Penile pain?: No  Gastrointestinal Nausea?: No Vomiting?: No Indigestion/heartburn?: No Diarrhea?: No Constipation?: No  Constitutional Fever: No Night sweats?: No Weight loss?: No Fatigue?: No  Skin Skin rash/lesions?: No Itching?: No  Eyes Blurred vision?: No Double vision?: No  Ears/Nose/Throat Sore throat?: No Sinus problems?: Yes  Hematologic/Lymphatic Swollen glands?: No Easy bruising?: No  Cardiovascular Leg swelling?: No Chest pain?: No  Respiratory Cough?: No Shortness of breath?: No  Endocrine Excessive thirst?: No  Musculoskeletal Back pain?: No Joint pain?: Yes  Neurological Headaches?: No Dizziness?: No  Psychologic Depression?: No Anxiety?: No  Physical Exam: BP 130/83 (BP Location: Right Arm, Patient Position: Sitting, Cuff Size: Normal)   Pulse 85   Ht 5\' 10"  (1.778 m)   Wt 190 lb 3.2 oz (86.3 kg)   BMI 27.29 kg/m   Constitutional:  Alert and oriented, No acute distress. HEENT: [place] AT, moist mucus membranes.  Trachea midline, no masses. Cardiovascular: No clubbing, cyanosis, or edema. Respiratory: Normal respiratory effort, no increased work of breathing. GI: Abdomen is soft, nontender, nondistended, no abdominal masses GU: No CVA  tenderness.  Skin: No rashes, bruises or suspicious lesions. Lymph: No cervical or inguinal adenopathy. Neurologic: Grossly intact, no focal deficits, moving all 4 extremities. Psychiatric: Normal mood and affect.  Laboratory Data: Lab Results  Component Value Date   WBC 8.4 08/21/2016   HGB 15.0 08/21/2016   HCT 43.3 08/21/2016   MCV 87.6 08/21/2016   PLT 372 08/21/2016    Lab Results  Component Value Date   CREATININE 1.02 08/21/2016    Lab Results  Component Value Date   PSA1 0.4 12/18/2014    No results found for: TESTOSTERONE  Lab Results  Component Value Date   HGBA1C 5.2 10/26/2016     Assessment & Plan:    1. Benign prostatic hyperplasia with incomplete bladder emptying We discussed at length surgery including TURP, PVP.  Minimally invasive options of water vapor ablation and uro-lift were  discussed.  The pros and cons of each treatment were discussed.  Based on his intravesical median lobe would recommend TURP.  The procedure was discussed including potential risks of bleeding, infection, persistent voiding symptoms, persistent elevated residual, urethral stricture, bladder neck contracture and urinary incontinence.  The common side effect of retrograde ejaculation was also discussed.  He states he is scheduled for foot surgery next week and will call back when he desires to proceed with TURP.  Repeat bladder scan today approximately 90 minutes after voiding was 111 mL.    Riki AltesScott C Stoioff, MD  [facility] [address], [address] [place], [place] [address] [phone]

## 2017-03-31 HISTORY — PX: FOOT SURGERY: SHX648

## 2017-04-04 ENCOUNTER — Other Ambulatory Visit: Payer: Self-pay | Admitting: Podiatry

## 2017-04-04 ENCOUNTER — Ambulatory Visit: Payer: 59 | Attending: Orthopaedic Surgery | Admitting: Physical Therapy

## 2017-04-04 DIAGNOSIS — G8929 Other chronic pain: Secondary | ICD-10-CM | POA: Insufficient documentation

## 2017-04-04 DIAGNOSIS — M6281 Muscle weakness (generalized): Secondary | ICD-10-CM | POA: Insufficient documentation

## 2017-04-04 DIAGNOSIS — M25512 Pain in left shoulder: Secondary | ICD-10-CM | POA: Insufficient documentation

## 2017-04-04 MED ORDER — CEPHALEXIN 500 MG PO CAPS: 500.0000 mg | ORAL_CAPSULE | Freq: Three times a day (TID) | ORAL | 0 refills | Status: DC

## 2017-04-04 MED ORDER — PROMETHAZINE HCL 25 MG PO TABS: 25.0000 mg | ORAL_TABLET | Freq: Three times a day (TID) | ORAL | 0 refills | Status: DC | PRN

## 2017-04-04 MED ORDER — OXYCODONE-ACETAMINOPHEN 10-325 MG PO TABS: 1.0000 | ORAL_TABLET | ORAL | 0 refills | Status: DC | PRN

## 2017-04-04 NOTE — Therapy (Signed)
[facility] [address] [place], [address] Phone: [phone]   Fax:  [phone]  Physical Therapy Treatment  Patient Details  Name: Austin Stewart MRN: [id number] Date of Birth: 1955/08/26 Referring Provider: Dr. Ricardo Jericho   Encounter Date: 04/04/2017    Past Medical History:  Diagnosis Date  . Allergic rhinitis, cause unspecified   . Allergy   . Cardiac dysrhythmia, unspecified   . Diverticulosis of colon (without mention of hemorrhage)   . Dysrhythmia   . Esophageal reflux   . Measles without mention of complication   . Mumps without mention of complication   . Osteoarthrosis, unspecified whether generalized or localized, unspecified site   . 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 Unspecified hyperplasia of prostate without urinary obstruction and other lower urinary tract symptoms (LUTS)     Past Surgical History:  Procedure Laterality Date  . CARDIAC CATHETERIZATION  2006   [facility]   . HERNIA REPAIR    . MITRAL VALVE REPAIR    . rotator cuff repair    . SHOULDER ARTHROSCOPY Left 08/24/2016   Procedure: ARTHROSCOPY SHOULDER with debridement;  Surgeon: Thornton Park, MD;  Location: [facility] ORS;  Service: Orthopedics;  Laterality: Left;  . TONSILLECTOMY      There were no vitals filed for this visit.                                PT Long Term Goals - 02/05/17 1259      PT LONG TERM GOAL #1   Title  Patient will demonstrate at least 150 degrees of active shoulder flexion to perform overhead activities    Baseline  ~100 degrees     Time  16    Period  Weeks    Status  Partially Met      PT LONG TERM GOAL #2   Title  Patient will be able to perform light lifting duties (10# or less) at work to complete work related duties with no increase in pain.     Baseline  Unable - precautions     Time  16    Period  Weeks     Status  On-going      PT LONG TERM GOAL #3   Title  Patient will report worst pain of no more than 2/10 in LUE to demonstrate improved tolerance for work related activities.     Baseline  No pain currently, but multi-modal use of pain medications/interventions.     Time  16    Period  Weeks    Status  Partially Met      PT LONG TERM GOAL #4   Title  Patient will report Quick Dash score of less than 25% to demonstrate improved tolerance for ADLs.     Baseline  Did not complete due to immobilization.     Time  16    Period  Weeks    Status  On-going            Plan - 04/04/17 1622    Clinical Impression Statement  Patient returns for follow up, he has seen his surgeon recently and she would like his care transferred to the [facility]. Given the change in care, deferred any treatment this date.     Clinical Presentation  Stable    Clinical  Decision Making  Low    Rehab Potential  Good    Clinical Impairments Affecting Rehab Potential  Significant repair, 3rd arthroscopic shoulder sx     PT Frequency  2x / week    PT Duration  12 weeks    PT Treatment/Interventions  Aquatic Therapy;Moist Heat;Iontophoresis 13m/ml Dexamethasone;Cryotherapy;Electrical Stimulation;Therapeutic exercise;Therapeutic activities;Neuromuscular re-education;Taping;Manual techniques;Dry needling;Biofeedback;Passive range of motion    PT Next Visit Plan  Progress ROM and manual interventions per protocol     PT Home Exercise Plan  Passive ER within designated guidelines, same for PROM flexion.     Consulted and Agree with Plan of Care  Patient       Patient will benefit from skilled therapeutic intervention in order to improve the following deficits and impairments:  Pain, Improper body mechanics, Decreased range of motion, Decreased strength, Impaired UE functional use, Decreased activity tolerance, Decreased knowledge of precautions  Visit Diagnosis: Muscle weakness  (generalized)  Chronic left shoulder pain     Problem List Patient Active Problem List   Diagnosis Date Noted  . Trochanteric bursitis, left hip 02/01/2016  . Pre-diabetes 08/05/2015  . Seasonal allergies 08/05/2015  . Elevated lipids 08/05/2015  . Chest tightness 06/11/2015  . Cutaneous skin tags 01/05/2015  . GERD without esophagitis 12/07/2014  . BPH (benign prostatic hyperplasia) 12/07/2014  . Diastolic CHF ([place] 032/00/3794 . Shortness of breath 06/09/2013  . H/O mitral valve repair 06/03/2012  . Heart palpitations 06/03/2012  . Dizziness 06/03/2012   PRoyce MacadamiaPT, DPT, CSCS    04/04/2017, 4:23 PM  [facility] [address] [place] [address]Phone: [phone]  Fax:  [phone] Name: Austin HONEYMANMRN: [id number] of Birth: 810-Oct-1957

## 2017-04-06 ENCOUNTER — Encounter: Payer: Self-pay | Admitting: Podiatry

## 2017-04-06 DIAGNOSIS — M25571 Pain in right ankle and joints of right foot: Secondary | ICD-10-CM | POA: Diagnosis not present

## 2017-04-06 DIAGNOSIS — M2011 Hallux valgus (acquired), right foot: Secondary | ICD-10-CM | POA: Diagnosis not present

## 2017-04-06 DIAGNOSIS — M21611 Bunion of right foot: Secondary | ICD-10-CM | POA: Diagnosis not present

## 2017-04-06 DIAGNOSIS — M21541 Acquired clubfoot, right foot: Secondary | ICD-10-CM | POA: Diagnosis not present

## 2017-04-06 DIAGNOSIS — M216X1 Other acquired deformities of right foot: Secondary | ICD-10-CM | POA: Diagnosis not present

## 2017-04-06 DIAGNOSIS — K219 Gastro-esophageal reflux disease without esophagitis: Secondary | ICD-10-CM | POA: Diagnosis not present

## 2017-04-06 DIAGNOSIS — M2021 Hallux rigidus, right foot: Secondary | ICD-10-CM | POA: Diagnosis not present

## 2017-04-06 DIAGNOSIS — M205X1 Other deformities of toe(s) (acquired), right foot: Secondary | ICD-10-CM | POA: Diagnosis not present

## 2017-04-11 ENCOUNTER — Encounter: Payer: Self-pay | Admitting: Podiatry

## 2017-04-11 ENCOUNTER — Ambulatory Visit (INDEPENDENT_AMBULATORY_CARE_PROVIDER_SITE_OTHER): Payer: 59 | Admitting: Podiatry

## 2017-04-11 ENCOUNTER — Ambulatory Visit (INDEPENDENT_AMBULATORY_CARE_PROVIDER_SITE_OTHER): Payer: 59

## 2017-04-11 ENCOUNTER — Other Ambulatory Visit: Payer: Self-pay | Admitting: Podiatry

## 2017-04-11 VITALS — BP 138/85 | HR 73 | Resp 16

## 2017-04-11 DIAGNOSIS — M21621 Bunionette of right foot: Secondary | ICD-10-CM

## 2017-04-11 DIAGNOSIS — M205X1 Other deformities of toe(s) (acquired), right foot: Secondary | ICD-10-CM

## 2017-04-11 NOTE — Progress Notes (Signed)
He presents today for his first postop visit status post Keller arthroplasty single silicone implant right foot as well as 1/5 metatarsal osteotomy with double screw fixation right foot.  Date of surgery 04/06/2017.  He states that he is feeling okay.  Objective: Vital signs are stable alert and oriented x3.  Dry sterile dressing intact once removed demonstrates minimal edema no erythema cellulitis drainage or odor.  Sutures are intact and margins are well coapted.  He has good range of motion dorsiflexion plantarflexion passive and as well as active.  Radiographs demonstrate good placement of a Keller arthroplasty single silicone implant and 1/5 met osteotomy with double screw fixation.  No fractures are noted.  Assessment: Well-healing surgical foot.  Plan: Redressed today follow-up with me in 1 week for possible suture removal.

## 2017-04-18 ENCOUNTER — Encounter: Payer: Self-pay | Admitting: Podiatry

## 2017-04-18 ENCOUNTER — Ambulatory Visit (INDEPENDENT_AMBULATORY_CARE_PROVIDER_SITE_OTHER): Payer: 59 | Admitting: Podiatry

## 2017-04-18 DIAGNOSIS — M205X1 Other deformities of toe(s) (acquired), right foot: Secondary | ICD-10-CM | POA: Diagnosis not present

## 2017-04-18 NOTE — Progress Notes (Signed)
He presents today for follow-up of his [name] bunion implant right.  Date of surgery 04/06/2017.  His fifth metatarsal osteotomy is doing well as well.  Objective: Vital signs are stable he is alert and oriented x3.  Pulses are palpable.  He has great range of motion of the first metatarsophalangeal joint mildly edematous.  At this point we are going to encourage range of motion exercises.  Assessment: Sutures removed margins well coapted well-healing surgical foot.  Plan: I spent compression a plate in a Darco shoe encourage range of motion exercises follow-up with him in 2 weeks for another set of x-rays.  Consider tennis shoes at that point.  We will probably let him go back to work at that point as long as he can keep his feet elevated as not have to do a lot of walking.

## 2017-04-26 DIAGNOSIS — M25512 Pain in left shoulder: Secondary | ICD-10-CM | POA: Diagnosis not present

## 2017-04-26 DIAGNOSIS — G8929 Other chronic pain: Secondary | ICD-10-CM | POA: Diagnosis not present

## 2017-05-07 ENCOUNTER — Ambulatory Visit (INDEPENDENT_AMBULATORY_CARE_PROVIDER_SITE_OTHER): Payer: 59

## 2017-05-07 ENCOUNTER — Encounter: Payer: Self-pay | Admitting: Podiatry

## 2017-05-07 ENCOUNTER — Ambulatory Visit (INDEPENDENT_AMBULATORY_CARE_PROVIDER_SITE_OTHER): Payer: 59 | Admitting: Podiatry

## 2017-05-07 DIAGNOSIS — M205X1 Other deformities of toe(s) (acquired), right foot: Secondary | ICD-10-CM | POA: Diagnosis not present

## 2017-05-07 DIAGNOSIS — M21621 Bunionette of right foot: Secondary | ICD-10-CM

## 2017-05-07 DIAGNOSIS — M2041 Other hammer toe(s) (acquired), right foot: Secondary | ICD-10-CM

## 2017-05-07 NOTE — Progress Notes (Signed)
He presents today date of surgery 04/06/2017 status post Austin Stewart arthroplasty with a single silicone implant right and 1/5 metatarsal osteotomy right.  He states that they are feeling pretty good.  Objective: Resents today in his Darco shoe mild edema no erythema cellulitis drainage or odor sutures gone on to heal uneventfully..  He has a good range of motion of the first metatarsophalangeal joint right foot.  Radiographs confirm well healing osteotomy fifth metatarsal right and a well-healing Keller arthroplasty right.  Assessment: Well-healing surgical foot right.  Date of surgery 04/06/2017.  Plan: I would encourage him to get back into a regular pair shoes will follow up with him in 1 month.

## 2017-05-10 DIAGNOSIS — G8929 Other chronic pain: Secondary | ICD-10-CM | POA: Diagnosis not present

## 2017-05-10 DIAGNOSIS — M25512 Pain in left shoulder: Secondary | ICD-10-CM | POA: Diagnosis not present

## 2017-05-22 DIAGNOSIS — M25512 Pain in left shoulder: Secondary | ICD-10-CM | POA: Diagnosis not present

## 2017-05-22 DIAGNOSIS — G8929 Other chronic pain: Secondary | ICD-10-CM | POA: Diagnosis not present

## 2017-06-04 ENCOUNTER — Encounter: Payer: Self-pay | Admitting: Podiatry

## 2017-06-04 ENCOUNTER — Ambulatory Visit (INDEPENDENT_AMBULATORY_CARE_PROVIDER_SITE_OTHER): Payer: 59 | Admitting: Podiatry

## 2017-06-04 ENCOUNTER — Ambulatory Visit (INDEPENDENT_AMBULATORY_CARE_PROVIDER_SITE_OTHER): Payer: 59

## 2017-06-04 DIAGNOSIS — M205X1 Other deformities of toe(s) (acquired), right foot: Secondary | ICD-10-CM

## 2017-06-04 DIAGNOSIS — M21621 Bunionette of right foot: Secondary | ICD-10-CM | POA: Diagnosis not present

## 2017-06-04 DIAGNOSIS — M2041 Other hammer toe(s) (acquired), right foot: Secondary | ICD-10-CM

## 2017-06-04 NOTE — Progress Notes (Signed)
He presents today for follow-up of his [name] arthroplasty single silicone implant right foot and his fifth metatarsal osteotomy of the right fifth metatarsal with double screw fixation.  He states that the for the most part is doing very well and I am starting to see signs of healing swelling is going down veins are starting to pop back up and I can see my tendons again.  He states the most part is doing well but there are days that is painful.  He has stopped wearing his compression anklet.  Objective: Vital signs are stable alert oriented x3 pulses are palpable.  Great range of motion of the first metatarsophalangeal joint some tenderness on palpation of fifth metatarsal head of the right foot.  Radiographs taken today do not demonstrate any type of osseous abnormalities though he does appear to be healing through secondary intent rather than primary at the fifth metatarsal osteotomy.  First metatarsal surgery and his rates no bone breakdown and good placement of the implant.  Assessment: Well-healing surgical foot right.  Plan: We will follow-up with him in 1 month.  I will allow him to get back to his regular routine though I did express concern about high impact activities.

## 2017-06-11 DIAGNOSIS — M629 Disorder of muscle, unspecified: Secondary | ICD-10-CM | POA: Diagnosis not present

## 2017-06-11 DIAGNOSIS — M75122 Complete rotator cuff tear or rupture of left shoulder, not specified as traumatic: Secondary | ICD-10-CM | POA: Diagnosis not present

## 2017-06-11 DIAGNOSIS — G8929 Other chronic pain: Secondary | ICD-10-CM | POA: Diagnosis not present

## 2017-06-11 DIAGNOSIS — M25512 Pain in left shoulder: Secondary | ICD-10-CM | POA: Diagnosis not present

## 2017-06-11 DIAGNOSIS — M7542 Impingement syndrome of left shoulder: Secondary | ICD-10-CM | POA: Diagnosis not present

## 2017-06-15 ENCOUNTER — Other Ambulatory Visit: Payer: Self-pay | Admitting: Cardiovascular Disease

## 2017-06-16 NOTE — Progress Notes (Signed)
Cardiology Office Note  Date:  06/19/2017   ID:  Austin MarrowRobert D Stewart, DOB 05/21/1955, Austin Stewart  PCP:  Smitty CordsKaramalegos, Austin Stewart, Austin Stewart   Chief Complaint  Patient presents with  . other    85mo f/u. Pt states is he doing well. Reviewed meds with pt verbally.    HPI:  Austin Stewart is a 62 year old gentleman,  history of  significant mitral valve regurgitation (torn leaflet? by his report) with associated shortness of breath,  [facility] with maze procedure at that time,  shoulder surgery March 2013 canceled secondary to arrhythmia, likely PACs with followup echocardiogram and stress test showing no ischemia by report, s houlder surgery April 2013  He is taking flecainide presumably for ectopy. This was started by prior cardiologist. who presents for routine followup of his arrhythmia.  In follow-up today he reports that he is feeling well Recent bilateral shoulder surgery, toe surgery Has not been exercising, relatively inactive Weight up at least 10 pounds over the past year Getting ready to restart exercise program once he is cleared by surgery Denies any tachycardia He continues on flecainide 50 mill grams twice a day with metoprolol 12.5 mg   Previous lab work reviewed with him HBA1C 5.2 Lipids pending  EKG personally reviewed by myself on todays visit Shows normal sinus rhythm rate 65 bpm no significant ST or T wave changes  CT scan March 2017 Coronary calcium score of 130. This was 4264 percentile for age and sex matched control.  Previously started on Lipitor 10 mg daily, reported having hives, stopped the medication Cholesterol dropped from 168 down to 93 on Lipitor in May 2017  Event monitor reviewed with him showing rare episodes of SVT ranging from 4 beats up to 19 beats, 12 episodes in total over 2 week.  Total 138, LDL 71 HBa1C 5.2  Other past medical history reviewed  Father passed away earlier in 2017, lived in  [place]  echocardiogram in 2013 which showed well functioning mitral valve.  Plan Holter monitor in 2013. He does not know what that showed.   cholesterol has not been a problem for the patient. No significant family history of coronary artery disease. Nonsmoker. no coronary artery disease seen on previous cardiac catheterization in 2006 He was started on flecainide 50 mg twice a day for ectopy in 2013.  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  PSH:    Past Surgical History:  Procedure Laterality Date  . CARDIAC CATHETERIZATION  2006   [facility]   . FOOT SURGERY  03/2017   right great toe Dr.Hyatt   . HERNIA REPAIR    . MITRAL VALVE REPAIR    . rotator cuff repair    . SHOULDER ARTHROSCOPY Left 08/24/2016   Procedure: ARTHROSCOPY SHOULDER with debridement;  Surgeon: Juanell FairlyKevin Krasinski, MD;  Location: [facility] ORS;  Service: Orthopedics;  Laterality: Left;  . TONSILLECTOMY      Current Outpatient Medications  Medication Sig Dispense Refill  . aspirin 81 MG tablet Take 81 mg by mouth daily.    . fexofenadine-pseudoephedrine (ALLEGRA-D) 60-120 MG 12 hr tablet Take 1 tablet 2 (  two) times daily by mouth. 20 tablet 0  . flecainide (TAMBOCOR) 50 MG tablet Take 1 tablet (50 mg total) by mouth 2 (two) times daily. 180 tablet 3  . fluticasone (FLONASE) 50 MCG/ACT nasal spray Place 2 sprays into both nostrils daily. Dispense 3 bottles. 48 g 3  . metoprolol succinate (TOPROL-XL) 25 MG 24 hr tablet Take 1 tablet (25 mg  total) by mouth daily. 90 tablet 3  . montelukast (SINGULAIR) 10 MG tablet Take 1 tablet (10 mg total) by mouth at bedtime. 90 tablet 3  . Multiple Vitamins-Minerals (MULTIVITAMIN WITH MINERALS) tablet Take 1 tablet by mouth daily.    . naphazoline-pheniramine ([employer] EYE ALLERGY RELIEF) 0.025-0.3 % ophthalmic solution Place 1 drop into both eyes 4 (four) times daily as needed for irritation.    Marland Kitchen omeprazole (PRILOSEC) 20 MG capsule TAKE 1 CAPSULE BY MOUTH DAILY. 90 capsule 3  . sildenafil (REVATIO) 20 MG tablet Take 1-5 pills about 30 min prior to sex. Start with 1 and increase as needed. 20 tablet 1  . sodium chloride (OCEAN) 0.65 % SOLN nasal spray Place 1 spray into both nostrils as needed for congestion. 15 mL 0  . tamsulosin (FLOMAX) 0.4 MG CAPS capsule Take 1 capsule (0.4 mg total) by mouth 2 (two) times daily. 180 capsule 3  . Turmeric 500 MG CAPS Take 2 capsules by mouth daily.    Marland Kitchen Zoster Vac Recomb Adjuvanted [facility]) injection Inject 0.5 mLs into the muscle every 6 (six) months. For series of 2 injections total 0.5 mL 1   No current facility-administered medications for this visit.      Allergies:   Other   Social History:  The patient  reports that  has never smoked. he has never used smokeless tobacco. He reports that he does not drink alcohol or use drugs.   Family History:   family history includes Cancer in his maternal uncle; Dementia in his father; Emphysema in his father; Heart disease in his mother; Hypertension in his mother.    Review of Systems: Review of Systems  Constitutional: Negative.   Respiratory: Negative.   Cardiovascular: Negative.   Gastrointestinal: Negative.   Musculoskeletal: Positive for joint pain.  Neurological: Negative.   Psychiatric/Behavioral: Negative.   All other systems reviewed and are negative.    PHYSICAL EXAM: VS:  BP 114/82 (BP Location: Left Arm, Patient Position: Sitting, Cuff Size: Normal)   Pulse 65   Ht 5\' 10"  (1.778 m)    Wt 201 lb (91.2 kg)   BMI 28.84 kg/m  , BMI Body mass index is 28.84 kg/m. Constitutional:  oriented to person, place, and time. No distress.  HENT:  Head: Normocephalic and atraumatic.  Eyes:  no discharge. No scleral icterus.  Neck: Normal range of motion. Neck supple. No JVD present.  Cardiovascular: Normal rate, regular rhythm, normal heart sounds and intact distal pulses. Exam reveals no gallop and no friction rub. No edema No murmur heard. Pulmonary/Chest: Effort normal and breath sounds normal. No stridor. No respiratory distress.  no wheezes.  no rales.  no tenderness.  Abdominal: Soft.  no distension.  no tenderness.  Musculoskeletal: Normal range of motion.  no  tenderness or deformity.  Neurological:  normal muscle tone. Coordination normal. No atrophy Skin: Skin is warm and dry. No rash noted. not diaphoretic.  Psychiatric:  normal mood and affect. behavior is normal. Thought content normal.    Recent Labs: 08/21/2016: BUN 15; Creatinine, Ser 1.02; Hemoglobin 15.0; Platelets 372; Potassium 3.7;  Sodium 139    Lipid Panel Lab Results  Component Value Date   CHOL 138 06/16/2016   HDL 48 06/16/2016   LDLCALC 71 06/16/2016   TRIG 97 06/16/2016      Wt Readings from Last 3 Encounters:  06/19/17 201 lb (91.2 kg)  03/30/17 190 lb 3.2 oz (86.3 kg)  03/19/17 192 lb 12.8 oz (87.5 kg)      ASSESSMENT AND PLAN:  Diastolic congestive heart failure, unspecified congestive heart failure chronicity (HCC) - Plan: EKG 12-Lead Appears euvolemic, no changes to his medications  H/O mitral valve repair - Plan: EKG 12-Lead No significant murmur on exam We have offered echocardiogram as on previous visits As he has no new symptoms, he is inclined not to Austin Stewart echocardiogram  SVT/Heart palpitations - Plan: EKG 12-Lead Taking  flecainide and low-dose metoprolol  Hyperlipidemia Lab work pending,  Will likely be elevated given 10 pound weight gain  Prediabetes  10 pound weight  gain, recommend he restart his exercise program   Total encounter time more than 25 minutes  Greater than 50% was spent in counseling and coordination of care with the patient   Disposition:   F/U  12 months   Orders Placed This Encounter  Procedures  . EKG 12-Lead     Signed, Dossie Arbour, M.D., Ph.D. 06/19/2017  [facility] [place], [place] [phone]

## 2017-06-19 ENCOUNTER — Encounter: Payer: Self-pay | Admitting: Cardiovascular Disease

## 2017-06-19 ENCOUNTER — Ambulatory Visit: Payer: 59 | Admitting: Cardiovascular Disease

## 2017-06-19 VITALS — BP 114/82 | HR 65 | Ht 70.0 in | Wt 201.0 lb

## 2017-06-19 DIAGNOSIS — R079 Chest pain, unspecified: Secondary | ICD-10-CM

## 2017-06-19 DIAGNOSIS — R0602 Shortness of breath: Secondary | ICD-10-CM | POA: Diagnosis not present

## 2017-06-19 DIAGNOSIS — R7303 Prediabetes: Secondary | ICD-10-CM

## 2017-06-19 DIAGNOSIS — Z9889 Other specified postprocedural states: Secondary | ICD-10-CM

## 2017-06-19 DIAGNOSIS — I471 Supraventricular tachycardia: Secondary | ICD-10-CM | POA: Diagnosis not present

## 2017-06-19 MED ORDER — METOPROLOL SUCCINATE ER 25 MG PO TB24: 25.0000 mg | ORAL_TABLET | Freq: Every day | ORAL | 3 refills | Status: DC

## 2017-06-19 MED ORDER — FLECAINIDE ACETATE 50 MG PO TABS: 50.0000 mg | ORAL_TABLET | Freq: Two times a day (BID) | ORAL | 3 refills | Status: DC

## 2017-06-19 NOTE — Patient Instructions (Signed)

## 2017-06-25 ENCOUNTER — Other Ambulatory Visit: Payer: Self-pay

## 2017-06-25 DIAGNOSIS — Z125 Encounter for screening for malignant neoplasm of prostate: Secondary | ICD-10-CM

## 2017-06-25 DIAGNOSIS — R7303 Prediabetes: Secondary | ICD-10-CM

## 2017-06-25 DIAGNOSIS — R799 Abnormal finding of blood chemistry, unspecified: Secondary | ICD-10-CM

## 2017-06-25 DIAGNOSIS — E785 Hyperlipidemia, unspecified: Secondary | ICD-10-CM

## 2017-06-25 DIAGNOSIS — I5032 Chronic diastolic (congestive) heart failure: Secondary | ICD-10-CM

## 2017-06-25 DIAGNOSIS — Z0000 Encounter for general adult medical examination without abnormal findings: Secondary | ICD-10-CM

## 2017-06-25 NOTE — Addendum Note (Signed)
Addended by: Margy ClarksMATKINS, Jahzier Villalon on: 06/25/2017 10:29 AM   Modules accepted: Orders

## 2017-06-26 ENCOUNTER — Other Ambulatory Visit: Payer: 59

## 2017-06-26 ENCOUNTER — Encounter: Payer: Self-pay | Admitting: Family Medicine

## 2017-06-26 DIAGNOSIS — E785 Hyperlipidemia, unspecified: Secondary | ICD-10-CM | POA: Diagnosis not present

## 2017-06-26 DIAGNOSIS — R7303 Prediabetes: Secondary | ICD-10-CM | POA: Diagnosis not present

## 2017-06-26 DIAGNOSIS — I5032 Chronic diastolic (congestive) heart failure: Secondary | ICD-10-CM | POA: Diagnosis not present

## 2017-06-26 DIAGNOSIS — K219 Gastro-esophageal reflux disease without esophagitis: Secondary | ICD-10-CM

## 2017-06-26 DIAGNOSIS — R799 Abnormal finding of blood chemistry, unspecified: Secondary | ICD-10-CM | POA: Diagnosis not present

## 2017-06-26 MED ORDER — OMEPRAZOLE 20 MG PO CPDR: 20.0000 mg | DELAYED_RELEASE_CAPSULE | Freq: Every day | ORAL | 1 refills | Status: DC

## 2017-06-27 LAB — LIPID PANEL
Cholesterol: 150 mg/dL (ref <200)
HDL: 53 mg/dL (ref >40)
LDL Cholesterol (Calc): 78 mg/dL (calc)
NON-HDL CHOLESTEROL (CALC): 97 mg/dL (ref <130)
TRIGLYCERIDES: 103 mg/dL (ref <150)
Total CHOL/HDL Ratio: 2.8 (calc) (ref <5.0)

## 2017-06-27 LAB — CBC WITH DIFFERENTIAL/PLATELET
BASOS ABS: 69 {cells}/uL (ref 0–200)
Basophils Relative: 1.1 %
EOS ABS: 258 {cells}/uL (ref 15–500)
Eosinophils Relative: 4.1 %
HEMATOCRIT: 43.6 % (ref 38.5–50.0)
Hemoglobin: 15.2 g/dL (ref 13.2–17.1)
LYMPHS ABS: 1367 {cells}/uL (ref 850–3900)
MCH: 30 pg (ref 27.0–33.0)
MCHC: 34.9 g/dL (ref 32.0–36.0)
MCV: 86 fL (ref 80.0–100.0)
MPV: 10.3 fL (ref 7.5–12.5)
Monocytes Relative: 7 %
NEUTROS PCT: 66.1 %
Neutro Abs: 4164 cells/uL (ref 1500–7800)
Platelets: 368 10*3/uL (ref 140–400)
RBC: 5.07 10*6/uL (ref 4.20–5.80)
RDW: 13.2 % (ref 11.0–15.0)
Total Lymphocyte: 21.7 %
WBC: 6.3 10*3/uL (ref 3.8–10.8)
WBCMIX: 441 {cells}/uL (ref 200–950)

## 2017-06-27 LAB — COMPLETE METABOLIC PANEL WITH GFR
AG Ratio: 2.4 (calc) (ref 1.0–2.5)
ALBUMIN MSPROF: 4.5 g/dL (ref 3.6–5.1)
ALT: 25 U/L (ref 9–46)
AST: 20 U/L (ref 10–35)
Alkaline phosphatase (APISO): 85 U/L (ref 40–115)
BUN / CREAT RATIO: 13 (calc) (ref 6–22)
BUN: 17 mg/dL (ref 7–25)
CO2: 30 mmol/L (ref 20–32)
CREATININE: 1.3 mg/dL — AB (ref 0.70–1.25)
Calcium: 9.8 mg/dL (ref 8.6–10.3)
Chloride: 107 mmol/L (ref 98–110)
GFR, EST AFRICAN AMERICAN: 67 mL/min/{1.73_m2} (ref >=60)
GFR, Est Non African American: 58 mL/min/{1.73_m2} — ABNORMAL LOW (ref >=60)
GLOBULIN: 1.9 g/dL (ref 1.9–3.7)
Glucose, Bld: 106 mg/dL — ABNORMAL HIGH (ref 65–99)
Potassium: 5 mmol/L (ref 3.5–5.3)
Sodium: 142 mmol/L (ref 135–146)
TOTAL PROTEIN: 6.4 g/dL (ref 6.1–8.1)
Total Bilirubin: 0.8 mg/dL (ref 0.2–1.2)

## 2017-06-27 LAB — PSA, TOTAL WITH REFLEX TO PSA, FREE: PSA, Total: 0.5 ng/mL (ref <=4.0)

## 2017-06-27 LAB — HEMOGLOBIN A1C
Hgb A1c MFr Bld: 5.2 % of total Hgb (ref <5.7)
MEAN PLASMA GLUCOSE: 103 (calc)
eAG (mmol/L): 5.7 (calc)

## 2017-06-28 ENCOUNTER — Encounter: Payer: Self-pay | Admitting: Family Medicine

## 2017-06-28 DIAGNOSIS — R7989 Other specified abnormal findings of blood chemistry: Secondary | ICD-10-CM | POA: Insufficient documentation

## 2017-07-03 ENCOUNTER — Encounter: Payer: 59 | Admitting: Family Medicine

## 2017-07-10 ENCOUNTER — Ambulatory Visit (INDEPENDENT_AMBULATORY_CARE_PROVIDER_SITE_OTHER): Payer: 59 | Admitting: Family Medicine

## 2017-07-10 ENCOUNTER — Ambulatory Visit: Payer: 59

## 2017-07-10 ENCOUNTER — Other Ambulatory Visit: Payer: Self-pay | Admitting: Family Medicine

## 2017-07-10 ENCOUNTER — Encounter: Payer: Self-pay | Admitting: Family Medicine

## 2017-07-10 VITALS — BP 106/82 | HR 73 | Temp 98.6°F | Resp 16 | Ht 70.0 in | Wt 197.6 lb

## 2017-07-10 DIAGNOSIS — I5032 Chronic diastolic (congestive) heart failure: Secondary | ICD-10-CM

## 2017-07-10 DIAGNOSIS — M25552 Pain in left hip: Secondary | ICD-10-CM | POA: Diagnosis not present

## 2017-07-10 DIAGNOSIS — Z0000 Encounter for general adult medical examination without abnormal findings: Secondary | ICD-10-CM

## 2017-07-10 DIAGNOSIS — E785 Hyperlipidemia, unspecified: Secondary | ICD-10-CM | POA: Diagnosis not present

## 2017-07-10 DIAGNOSIS — R7303 Prediabetes: Secondary | ICD-10-CM | POA: Diagnosis not present

## 2017-07-10 DIAGNOSIS — G8929 Other chronic pain: Secondary | ICD-10-CM | POA: Diagnosis not present

## 2017-07-10 DIAGNOSIS — Z0001 Encounter for general adult medical examination with abnormal findings: Secondary | ICD-10-CM

## 2017-07-10 DIAGNOSIS — R7989 Other specified abnormal findings of blood chemistry: Secondary | ICD-10-CM

## 2017-07-10 DIAGNOSIS — M7062 Trochanteric bursitis, left hip: Secondary | ICD-10-CM | POA: Diagnosis not present

## 2017-07-10 LAB — POCT UA - MICROALBUMIN: Microalbumin Ur, POC: 0 mg/L

## 2017-07-10 MED ORDER — BACLOFEN 10 MG PO TABS: 5.0000 mg | ORAL_TABLET | Freq: Three times a day (TID) | ORAL | 1 refills | Status: DC | PRN

## 2017-07-10 NOTE — Patient Instructions (Addendum)
Thank you for coming to the office today.  1.  I'm concerned for possible Left Hip arthritis, causing your symptoms  Recommend to start taking Tylenol Extra Strength 500mg  tabs - take 1 to 2 tabs per dose (max 1000mg ) every 6-8 hours for pain (take regularly, don't skip a dose for next 7 days), max 24 hour daily dose is 6 tablets or 3000mg . In the future you can repeat the same everyday Tylenol course for 1-2 weeks at a time.   Start taking Baclofen (Lioresal) 10mg  (muscle relaxant) - start with half (cut) to one whole pill at night as needed for next 1-3 nights (may make you drowsy, caution with driving) see how it affects you, then if tolerated increase to one pill 2 to 3 times a day or (every 8 hours as needed)  Cholesterol results are good overall  Lipid Panel     Component Value Date/Time   CHOL 150 06/26/2017 0803   CHOL 94 (L) 09/24/2015 0727   TRIG 103 06/26/2017 0803   HDL 53 06/26/2017 0803   HDL 48 09/24/2015 0727   CHOLHDL 2.8 06/26/2017 0803   VLDL 19 06/16/2016 0001   LDLCALC 78 06/26/2017 0803    Call Dr Windell HummingbirdGollan's office to review this and agree with recommendation to try a Cholesterol medicine for reducing future risk.  DUE for FASTING BLOOD WORK (no food or drink after midnight before the lab appointment, only water or coffee without cream/sugar on the morning of)  SCHEDULE "Lab Only" visit in the morning at the clinic for lab draw on Monday 07/16/17   - Make sure Lab Only appointment is at about 1 week before your next appointment, so that results will be available  For Lab Results, once available within 2-3 days of blood draw, you can can log in to MyChart online to view your results and a brief explanation. Also, we can discuss results at next follow-up visit.   Please schedule a Follow-up Appointment to: Return in about 6 days (around 07/16/2017) for fasting lab on 07/16/17, then follow-up .  If you have any other questions or concerns, please feel free to call  the office or send a message through MyChart. You may also schedule an earlier appointment if necessary.  Additionally, you may be receiving a survey about your experience at our office within a few days to 1 week by e-mail or mail. We value your feedback.  Austin PilarAlexander Robbi Scurlock, DO [facility], [facility]   Hip Exercises Ask your health care provider which exercises are safe for you. Do exercises exactly as told by your health care provider and adjust them as directed. It is normal to feel mild stretching, pulling, tightness, or discomfort as you do these exercises, but you should stop right away if you feel sudden pain or your pain gets worse.Do not begin these exercises until told by your health care provider. STRETCHING AND RANGE OF MOTION EXERCISES These exercises warm up your muscles and joints and improve the movement and flexibility of your hip. These exercises also help to relieve pain, numbness, and tingling. Exercise A: Hamstrings, Supine  1. Lie on your back. 2. Loop a belt or towel over the ball of your left / rightfoot. The ball of your foot is on the walking surface, right under your toes. 3. Straighten your left / rightknee and slowly pull on the belt to raise your leg. ? Do not let your left / right knee bend while you do this. ? Keep  your other leg flat on the floor. ? Raise the left / right leg until you feel a gentle stretch behind your left / right knee or thigh. 4. Hold this position for __________ seconds. 5. Slowly return your leg to the starting position. Repeat __________ times. Complete this stretch __________ times a day. Exercise B: Hip Rotators  1. Lie on your back on a firm surface. 2. Hold your left / right knee with your left / right hand. Hold your ankle with your other hand. 3. Gently pull your left / right knee and rotate your lower leg toward your other shoulder. ? Pull until you feel a stretch in your buttocks. ? Keep your hips and  shoulders firmly planted while you do this stretch. 4. Hold this position for __________ seconds. Repeat __________ times. Complete this stretch __________ times a day. Exercise C: V-Sit (Hamstrings and Adductors)  1. Sit on the floor with your legs extended in a large "V" shape. Keep your knees straight during this exercise. 2. Start with your head and chest upright, then bend at your waist to reach for your left foot (position A). You should feel a stretch in your right inner thigh. 3. Hold this position for __________ seconds. Then slowly return to the upright position. 4. Bend at your waist to reach forward (position B). You should feel a stretch behind both of your thighs and knees. 5. Hold this position for __________ seconds. Then slowly return to the upright position. 6. Bend at your waist to reach for your right foot (position C). You should feel a stretch in your left inner thigh. 7. Hold this position for __________ seconds. Then slowly return to the upright position. Repeat __________ times. Complete this stretch __________ times a day. Exercise D: Lunge (Hip Flexors)  1. Place your left / right knee on the floor and bend your other knee so that is directly over your ankle. You should be half-kneeling. 2. Keep good posture with your head over your shoulders. 3. Tighten your buttocks to point your tailbone downward. This helps your back to keep from arching too much. 4. You should feel a gentle stretch in the front of your left / right thigh and hip. If you do not feel any resistance, slightly slide your other foot forward and then slowly lunge forward so your knee once again lines up over your ankle. 5. Make sure your tailbone continues to point downward. 6. Hold this position for __________ seconds. Repeat __________ times. Complete this stretch __________ times a day. STRENGTHENING EXERCISES These exercises build strength and endurance in your hip. Endurance is the ability to use  your muscles for a long time, even after they get tired. Exercise E: Bridge (Hip Extensors)  1. Lie on your back on a firm surface with your knees bent and your feet flat on the floor. 2. Tighten your buttocks muscles and lift your bottom off the floor until the trunk of your body is level with your thighs. ? Do not arch your back. ? You should feel the muscles working in your buttocks and the back of your thighs. If you do not feel these muscles, slide your feet 1-2 inches (2.5-5 cm) farther away from your buttocks. 3. Hold this position for __________ seconds. 4. Slowly lower your hips to the starting position. 5. Let your muscles relax completely between repetitions. 6. If this exercise is too easy, try doing it with your arms crossed over your chest. Repeat __________ times. Complete this  exercise __________ times a day. Exercise F: Straight Leg Raises - Hip Abductors  1. Lie on your side with your left / right leg in the top position. Lie so your head, shoulder, knee, and hip line up with each other. You may bend your bottom knee to help you balance. 2. Roll your hips slightly forward, so your hips are stacked directly over each other and your left / right knee is facing forward. 3. Leading with your heel, lift your top leg 4-6 inches (10-15 cm). You should feel the muscles in your outer hip lifting. ? Do not let your foot drift forward. ? Do not let your knee roll toward the ceiling. 4. Hold this position for __________ seconds. 5. Slowly return to the starting position. 6. Let your muscles relax completely between repetitions. Repeat __________ times. Complete this exercise __________ times a day. Exercise G: Straight Leg Raises - Hip Adductors  1. Lie on your side with your left / right leg in the bottom position. Lie so your head, shoulder, knee, and hip line up. You may place your upper foot in front to help you balance. 2. Roll your hips slightly forward, so your hips are stacked  directly over each other and your left / right knee is facing forward. 3. Tense the muscles in your inner thigh and lift your bottom leg 4-6 inches (10-15 cm). 4. Hold this position for __________ seconds. 5. Slowly return to the starting position. 6. Let your muscles relax completely between repetitions. Repeat __________ times. Complete this exercise __________ times a day. Exercise H: Straight Leg Raises - Quadriceps  1. Lie on your back with your left / right leg extended and your other knee bent. 2. Tense the muscles in the front of your left / right thigh. When you do this, you should see your kneecap slide up or see increased dimpling just above your knee. 3. Tighten these muscles even more and raise your leg 4-6 inches (10-15 cm) off the floor. 4. Hold this position for __________ seconds. 5. Keep these muscles tense as you lower your leg. 6. Relax the muscles slowly and completely between repetitions. Repeat __________ times. Complete this exercise __________ times a day. Exercise I: Hip Abductors, Standing 1. Tie one end of a rubber exercise band or tubing to a secure surface, such as a table or pole. 2. Loop the other end of the band or tubing around your left / right ankle. 3. Keeping your ankle with the band or tubing directly opposite of the secured end, step away until there is tension in the tubing or band. Hold onto a chair as needed for balance. 4. Lift your left / right leg out to your side. While you do this: ? Keep your back upright. ? Keep your shoulders over your hips. ? Keep your toes pointing forward. ? Make sure to use your hip muscles to lift your leg. Do not "throw" your leg or tip your body to lift your leg. 5. Hold this position for __________ seconds. 6. Slowly return to the starting position. Repeat __________ times. Complete this exercise __________ times a day. Exercise J: Squats (Quadriceps) 1. Stand in a door frame so your feet and knees are in line  with the frame. You may place your hands on the frame for balance. 2. Slowly bend your knees and lower your hips like you are going to sit in a chair. ? Keep your lower legs in a straight-up-and-down position. ? Do not let  your hips go lower than your knees. ? Do not bend your knees lower than told by your health care provider. ? If your hip pain increases, do not bend as low. 3. Hold this position for ___________ seconds. 4. Slowly push with your legs to return to standing. Do not use your hands to pull yourself to standing. Repeat __________ times. Complete this exercise __________ times a day. This information is not intended to replace advice given to you by your health care provider. Make sure you discuss any questions you have with your health care provider. Document Released: 05/05/2005 Document Revised: 01/10/2016 Document Reviewed: 04/12/2015 Elsevier Interactive Patient Education  [employer].

## 2017-07-10 NOTE — Progress Notes (Signed)
Subjective:    Patient ID: Austin Stewart, male    DOB: 12/09/1955, 62 y.o.   MRN: [id number]  Austin Stewart is a 62 y.o. male presenting on 07/10/2017 for Annual Exam; Hip Pain; and Benign Prostatic Hypertrophy   HPI   Here for Annual Physical and Lab Review  Elevated Creatinine - Recent lab Cr 1.30, with lower GFR 58, in past had more normal Cr low 1s - He was taking a lot of NSAIDs regularly for months due to shoulder surgery / rehab etc, and also taking Aleve for arthritis pain in hands - He thinks not very well hydrated when he had the blood test - Tried some CBD Oil 20mg  twice daily, regularly, and no longer taking Aleve, Advil  Left Hip Pain Recurrent flare up (prior history in 2017 with similar problem), now over past several weeks, seems to be more chronic flare after significant surgery last year with R toe/foot surgery, he had limping on L leg for several weeks thinks this contributed, no prior hip problems, but 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 Seems unresolved, has episodic intermittent flare ups with pain, aching and stiffness, worse if walking upstairs, worse if laying on it prolonged in bed, or switching from prolonged sitting to standing - Was taking NSAIDs before, now not taking either this or Tylenol - also had L rotator cuff surgery x 2 since last visit, he is doing PT for this - Hot tub helps it, not using any topical ice or heat, muscle cream' - Denies any trauma or injury or twisting  Cardiology / history of SVT w/ palpitations / H/o Mitral Valve Repair / Diastolic CHF History of HLD Followed by Cardiology. Prior history of mild elevated lipids, has been stable and controlled now Continues on Flecanide and low dose BB metoprolol Not due for repeat ECHO or other intervention. Has been euvolemic Had CT angio of heart in past, < 10% build-up in veins by report He was advised by Cardiology that he may restart a Statin medicine in future for more preventative. He had  intolerance or side effect on Atorvastatin.  BPH, with LUTS Followed by [facility], has seen Dr Mena Goes and Dr Lonna Cobb, he has had cystoscopy, ultimately determined some BPH causing obstruction, has had normal PSA. They advised can increase Tamsulosin from 0.4mg  daily to BID with some improvement. He has been offered TURP for prostate and he has declined this option and will continue to manage symptoms for now   Health Maintenance: Due for routine HIV screening, will get with labs in future 1 year  Due for Urine Microalbumin - especially with elevated Cr - will check today  Depression screen [facility] 2/9 07/10/2017 06/14/2016 12/14/2015  Decreased Interest 0 0 0  Down, Depressed, Hopeless 0 0 0  PHQ - 2 Score 0 0 0    Past Medical History:  Diagnosis Date  . Allergic rhinitis, cause unspecified   . Allergy   . Cardiac dysrhythmia, unspecified   . Diverticulosis of colon (without mention of hemorrhage)   . Dysrhythmia   . Esophageal reflux   . Measles without mention of complication   . Mumps without mention of complication   . Osteoarthrosis, unspecified whether generalized or localized, unspecified site   . 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 Unspecified hyperplasia of prostate without urinary obstruction and other lower urinary tract symptoms (LUTS)    Past Surgical History:  Procedure Laterality Date  . CARDIAC  CATHETERIZATION  2006   [facility]   . FOOT SURGERY  03/2017   right great toe Dr.Hyatt   . HERNIA REPAIR    . MITRAL VALVE REPAIR    . rotator cuff repair    . SHOULDER ARTHROSCOPY Left 08/24/2016   Procedure: ARTHROSCOPY SHOULDER with debridement;  Surgeon: Juanell Fairly, MD;  Location: [facility] ORS;  Service: Orthopedics;  Laterality: Left;  . TONSILLECTOMY     Social History   Socioeconomic History  . Marital status: Married    Spouse name: Not on file  . Number of children: Not on file  . Years of education: Not on file    . Highest education level: Not on file  Social Needs  . Financial resource strain: Not on file  . Food insecurity - worry: Not on file  . Food insecurity - inability: Not on file  . Transportation needs - medical: Not on file  . Transportation needs - non-medical: Not on file  Occupational History  . Not on file  Tobacco Use  . Smoking status: Never Smoker  . Smokeless tobacco: Never Used  Substance and Sexual Activity  . Alcohol use: No  . Drug use: No  . Sexual activity: Not on file  Other Topics Concern  . Not on file  Social History Narrative  . Not on file   Family History  Problem Relation Age of Onset  . Heart disease Mother        s/p stent placement  . Hypertension Mother   . Emphysema Father   . Dementia Father   . Cancer Maternal Uncle        kidney cancer   Current Outpatient Medications on File Prior to Visit  Medication Sig  . aspirin 81 MG tablet Take 81 mg by mouth daily.  . fexofenadine-pseudoephedrine (ALLEGRA-D) 60-120 MG 12 hr tablet Take 1 tablet 2 (two) times daily by mouth.  . flecainide (TAMBOCOR) 50 MG tablet Take 1 tablet (50 mg total) by mouth 2 (two) times daily.  . fluticasone (FLONASE) 50 MCG/ACT nasal spray Place 2 sprays into both nostrils daily. Dispense 3 bottles.  . metoprolol succinate (TOPROL-XL) 25 MG 24 hr tablet Take 1 tablet (25 mg total) by mouth daily.  . montelukast (SINGULAIR) 10 MG tablet Take 1 tablet (10 mg total) by mouth at bedtime.  . Multiple Vitamins-Minerals (MULTIVITAMIN WITH MINERALS) tablet Take 1 tablet by mouth daily.  . naphazoline-pheniramine ([employer] EYE ALLERGY RELIEF) 0.025-0.3 % ophthalmic solution Place 1 drop into both eyes 4 (four) times daily as needed for irritation.  Marland Kitchen omeprazole (PRILOSEC) 20 MG capsule Take 1 capsule (20 mg total) by mouth daily.  . sildenafil (REVATIO) 20 MG tablet Take 1-5 pills about 30 min prior to sex. Start with 1 and increase as needed.  . sodium chloride (OCEAN) 0.65 % SOLN nasal  spray Place 1 spray into both nostrils as needed for congestion.  . tamsulosin (FLOMAX) 0.4 MG CAPS capsule Take 1 capsule (0.4 mg total) by mouth 2 (two) times daily.  . Turmeric 500 MG CAPS Take 2 capsules by mouth daily.  Marland Kitchen Zoster Vac Recomb Adjuvanted [facility]) injection Inject 0.5 mLs into the muscle every 6 (six) months. For series of 2 injections total   No current facility-administered medications on file prior to visit.     Review of Systems  Constitutional: Negative for activity change, appetite change, chills, diaphoresis, fatigue and fever.  HENT: Negative for congestion and hearing loss.   Eyes: Negative for  visual disturbance.  Respiratory: Negative for apnea, cough, choking, chest tightness, shortness of breath and wheezing.   Cardiovascular: Negative for chest pain, palpitations and leg swelling.  Gastrointestinal: Negative for abdominal pain, constipation, diarrhea, nausea and vomiting.  Endocrine: Negative for cold intolerance.  Genitourinary: Negative for decreased urine volume, dysuria, frequency, hematuria, testicular pain and urgency.  Musculoskeletal: Positive for arthralgias (Left hip, stiffness and pain intermittent) and gait problem (Improved, recent toe surgery). Negative for neck pain.  Skin: Negative for rash.  Allergic/Immunologic: Negative for environmental allergies.  Neurological: Negative for dizziness, weakness, light-headedness, numbness and headaches.  Hematological: Negative for adenopathy.  Psychiatric/Behavioral: Negative for behavioral problems, dysphoric mood and sleep disturbance. The patient is not nervous/anxious.    Per HPI unless specifically indicated above     Objective:    BP 106/82   Pulse 73   Temp 98.6 F (37 C) (Oral)   Resp 16   Ht 5\' 10"  (1.778 m)   Wt 197 lb 9.6 oz (89.6 kg)   BMI 28.35 kg/m   Wt Readings from Last 3 Encounters:  07/10/17 197 lb 9.6 oz (89.6 kg)  06/19/17 201 lb (91.2 kg)  03/30/17 190 lb 3.2 oz (86.3  kg)    Physical Exam  Constitutional: He is oriented to person, place, and time. He appears well-developed and well-nourished. No distress.  Well-appearing, comfortable, cooperative  HENT:  Head: Normocephalic and atraumatic.  Mouth/Throat: Oropharynx is clear and moist.  Eyes: Conjunctivae and EOM are normal. Pupils are equal, round, and reactive to light. Right eye exhibits no discharge. Left eye exhibits no discharge.  Neck: Normal range of motion. Neck supple. No thyromegaly present.  Cardiovascular: Normal rate, regular rhythm, normal heart sounds and intact distal pulses.  No murmur heard. Pulmonary/Chest: Effort normal and breath sounds normal. No respiratory distress. He has no wheezes. He has no rales.  Abdominal: Soft. Bowel sounds are normal. He exhibits no distension and no mass. There is no tenderness.  Musculoskeletal: Normal range of motion. He exhibits no edema or tenderness.  Upper / Lower Extremities: - Normal muscle tone, strength bilateral upper extremities 5/5, lower extremities 5/5  Low Back / L hip and Greater Trochanter Inspection: BACK - Normal appearance, no spinal deformity, symmetrical. HIP - Normal appearance, symmetrical, no obvious leg length or pelvis deformity  Palpation: BACK - No tenderness over spinous processes. Bilateral lumbar paraspinal muscles non-tender and without hypertonicity/spasm. HIP - Mild tender to palpation deeper L greater trochanter region of lateral upper thigh. Lower extremity thigh calf soft non tender no spasm.  ROM: BACK - Full active ROM forward flex / back extension, rotation L/R without discomfort HIP - Bilateral hip flex/ext supine normal, external rotation normal without problem or limitation. Mild reduced internal ROM  Special Testing: BACK - Seated SLR negative for radicular pain bilaterally HIP - FABER normal and non tender no limited movement. FADIR mild discomfort limited ROM. Acetabular compression of hip L is non  tender  Strength: Bilateral hip flex/ext 5/5, knee flex/ext 5/5, ankle dorsiflex/plantarflex 5/5 Neurovascular: intact distal sensation to light touch   Lymphadenopathy:    He has no cervical adenopathy.  Neurological: He is alert and oriented to person, place, and time.  Distal sensation intact to light touch all extremities  Skin: Skin is warm and dry. No rash noted. He is not diaphoretic. No erythema.  Psychiatric: He has a normal mood and affect. His behavior is normal.  Well groomed, good eye contact, normal speech and thoughts  Nursing  note and vitals reviewed.    Results for orders placed or performed in visit on 07/10/17  POCT UA - Microalbumin  Result Value Ref Range   Microalbumin Ur, POC 0 mg/L   Creatinine, POC  mg/dL   Albumin/Creatinine Ratio, Urine, POC        Assessment & Plan:   Problem List Items Addressed This Visit    Diastolic CHF (HCC)    Stable, controlled, euvolemic No recent ECHO repeated Followed by Cardiology      Elevated lipids    Improved, controlled lipids Followed by Cardiology Advised in past to reconsider statin vs Zetia, he will follow-up with Cardiology to discuss cholesterol therapy      Elevated serum creatinine    Elevated Cr, likely poor hydration recently also has been using higher frequency of NSAIDs due to MSK injuries and pain  Plan Improve hydration DC NSAIDs Take Tylenol PRN Follow-up within 1 week for repeat BMET      Relevant Orders   POCT UA - Microalbumin (Completed)   Pre-diabetes    Well-controlled Pre-DM with A1c 5.2  Plan:  1. Not on any therapy currently  2. Encourage improved lifestyle - low carb, low sugar diet, reduce portion size, continue improving regular exercise 3. Follow-up q 12 mo      Trochanteric bursitis, left hip    Flare up again, suspected of L trochanteric bursitis vs OA/DJD Likely secondary due to recent R foot surgery and affected his gait among other factors No prior imaging of L  hip  Plan Hold NSAIDs due to Cr Increase Tylenol Rx Baclofen PRN Continue stretches, heating pad, and home exercises, printed Follow-up if not improving, may need referral to formal PT for hip, after he has been doing PT for shoulder. Consider X-rays also steroid injection       Other Visit Diagnoses    Annual physical exam    -  Primary Reviewed and updated HM Urine microalbumin today - 0, reassuring with elevated Cr Due for routine HIV in 1 year as planned Encourage continue improving diet / exercise    Chronic left hip pain     See A&P    Relevant Medications   baclofen (LIORESAL) 10 MG tablet      Meds ordered this encounter  Medications  . baclofen (LIORESAL) 10 MG tablet    Sig: Take 0.5-1 tablets (5-10 mg total) by mouth 3 (three) times daily as needed for muscle spasms.    Dispense:  30 each    Refill:  1     Follow up plan: Return in about 6 days (around 07/16/2017) for fasting lab on 07/16/17, then follow-up .  Saralyn Pilar, DO [facility] 07/11/2017, 12:15 AM

## 2017-07-11 ENCOUNTER — Encounter: Payer: Self-pay | Admitting: Podiatry

## 2017-07-11 ENCOUNTER — Ambulatory Visit (INDEPENDENT_AMBULATORY_CARE_PROVIDER_SITE_OTHER): Payer: 59 | Admitting: Podiatry

## 2017-07-11 ENCOUNTER — Ambulatory Visit (INDEPENDENT_AMBULATORY_CARE_PROVIDER_SITE_OTHER): Payer: 59

## 2017-07-11 DIAGNOSIS — M2041 Other hammer toe(s) (acquired), right foot: Secondary | ICD-10-CM | POA: Diagnosis not present

## 2017-07-11 DIAGNOSIS — M21621 Bunionette of right foot: Secondary | ICD-10-CM

## 2017-07-11 DIAGNOSIS — M205X1 Other deformities of toe(s) (acquired), right foot: Secondary | ICD-10-CM

## 2017-07-11 DIAGNOSIS — M25512 Pain in left shoulder: Secondary | ICD-10-CM | POA: Diagnosis not present

## 2017-07-11 DIAGNOSIS — G8929 Other chronic pain: Secondary | ICD-10-CM | POA: Diagnosis not present

## 2017-07-11 NOTE — Assessment & Plan Note (Signed)
Stable, controlled, euvolemic No recent ECHO repeated Followed by Cardiology

## 2017-07-11 NOTE — Assessment & Plan Note (Signed)
Elevated Cr, likely poor hydration recently also has been using higher frequency of NSAIDs due to MSK injuries and pain  Plan Improve hydration DC NSAIDs Take Tylenol PRN Follow-up within 1 week for repeat BMET

## 2017-07-11 NOTE — Assessment & Plan Note (Signed)
Well-controlled Pre-DM with A1c 5.2  Plan:  1. Not on any therapy currently  2. Encourage improved lifestyle - low carb, low sugar diet, reduce portion size, continue improving regular exercise 3. Follow-up q 12 mo

## 2017-07-11 NOTE — Progress Notes (Signed)
He presents today for follow-up of his Austin Stewart bunion implant fifth metatarsal osteotomy right foot date of surgery April 06, 2017.  He states that I am doing great no pain whatsoever.  Objective: Vital signs are stable alert and oriented x3.  Pulses are palpable.  Neurologic sensorium is intact.  Degenerative flexors are intact.  Edema no erythema cellulitis drainage or odor great range of motion of the first metatarsophalangeal joint of the right foot with no pain.  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  Assessment: Well-healing surgical foot.  Plan: Follow-up with me on an as-needed basis.  We did discuss the possible need for an injection to the first metatarsophalangeal joint left.

## 2017-07-11 NOTE — Assessment & Plan Note (Signed)
Flare up again, suspected of L trochanteric bursitis vs OA/DJD Likely secondary due to recent R foot surgery and affected his gait among other factors No prior imaging of L hip  Plan Hold NSAIDs due to Cr Increase Tylenol Rx Baclofen PRN Continue stretches, heating pad, and home exercises, printed Follow-up if not improving, may need referral to formal PT for hip, after he has been doing PT for shoulder. Consider X-rays also steroid injection

## 2017-07-11 NOTE — Assessment & Plan Note (Signed)
Improved, controlled lipids Followed by Cardiology Advised in past to reconsider statin vs Zetia, he will follow-up with Cardiology to discuss cholesterol therapy

## 2017-07-12 ENCOUNTER — Telehealth: Payer: Self-pay | Admitting: Cardiovascular Disease

## 2017-07-12 ENCOUNTER — Other Ambulatory Visit: Payer: Self-pay

## 2017-07-12 DIAGNOSIS — R7989 Other specified abnormal findings of blood chemistry: Secondary | ICD-10-CM

## 2017-07-12 NOTE — Telephone Encounter (Signed)
Patient states that at last visit Dr. Mariah MillingGollan suggested he should begin a cholesterol medication Patient is unsure of the medication he mentioned but he is ready for it to be [facility] to begin  Please advise

## 2017-07-13 ENCOUNTER — Other Ambulatory Visit: Payer: 59

## 2017-07-13 MED ORDER — ROSUVASTATIN CALCIUM 5 MG PO TABS: 5.0000 mg | ORAL_TABLET | ORAL | 3 refills | Status: DC

## 2017-07-13 NOTE — Telephone Encounter (Signed)
Given coronary calcium score 130 Would take Crestor 5 mg every other day

## 2017-07-13 NOTE — Telephone Encounter (Signed)
Spoke with patient and reviewed medication recommendation and sent in to his pharmacy of choice. He was appreciative for the call back with no further questions at this time.

## 2017-07-26 DIAGNOSIS — G8929 Other chronic pain: Secondary | ICD-10-CM | POA: Diagnosis not present

## 2017-07-26 DIAGNOSIS — M25512 Pain in left shoulder: Secondary | ICD-10-CM | POA: Diagnosis not present

## 2017-08-06 DIAGNOSIS — M7542 Impingement syndrome of left shoulder: Secondary | ICD-10-CM | POA: Diagnosis not present

## 2017-08-06 DIAGNOSIS — M75122 Complete rotator cuff tear or rupture of left shoulder, not specified as traumatic: Secondary | ICD-10-CM | POA: Diagnosis not present

## 2017-08-06 DIAGNOSIS — M629 Disorder of muscle, unspecified: Secondary | ICD-10-CM | POA: Diagnosis not present

## 2017-08-06 DIAGNOSIS — M25512 Pain in left shoulder: Secondary | ICD-10-CM | POA: Diagnosis not present

## 2017-08-06 DIAGNOSIS — G8929 Other chronic pain: Secondary | ICD-10-CM | POA: Diagnosis not present

## 2017-08-16 ENCOUNTER — Other Ambulatory Visit: Payer: 59

## 2017-08-16 DIAGNOSIS — R7989 Other specified abnormal findings of blood chemistry: Secondary | ICD-10-CM | POA: Diagnosis not present

## 2017-08-17 LAB — BASIC METABOLIC PANEL WITH GFR
BUN: 16 mg/dL (ref 7–25)
CALCIUM: 9.6 mg/dL (ref 8.6–10.3)
CHLORIDE: 108 mmol/L (ref 98–110)
CO2: 28 mmol/L (ref 20–32)
Creat: 1.15 mg/dL (ref 0.70–1.25)
GFR, EST AFRICAN AMERICAN: 78 mL/min/{1.73_m2} (ref >=60)
GFR, Est Non African American: 67 mL/min/{1.73_m2} (ref >=60)
GLUCOSE: 103 mg/dL — AB (ref 65–99)
POTASSIUM: 5 mmol/L (ref 3.5–5.3)
Sodium: 141 mmol/L (ref 135–146)

## 2017-08-20 ENCOUNTER — Encounter (HOSPITAL_COMMUNITY): Payer: Self-pay

## 2017-08-20 ENCOUNTER — Other Ambulatory Visit: Payer: Self-pay

## 2017-08-20 ENCOUNTER — Ambulatory Visit (HOSPITAL_COMMUNITY)
Admission: EM | Admit: 2017-08-20 | Discharge: 2017-08-20 | Disposition: A | Discharge status: Home / Self Care | Payer: 59 | Attending: Family Medicine | Admitting: Family Medicine

## 2017-08-20 DIAGNOSIS — R21 Rash and other nonspecific skin eruption: Secondary | ICD-10-CM

## 2017-08-20 MED ORDER — VALACYCLOVIR HCL 1 G PO TABS: 1000.0000 mg | ORAL_TABLET | Freq: Three times a day (TID) | ORAL | 0 refills | Status: AC

## 2017-08-20 MED ORDER — ACYCLOVIR 800 MG PO TABS: 800.0000 mg | ORAL_TABLET | Freq: Every day | ORAL | 0 refills | Status: AC

## 2017-08-20 NOTE — ED Triage Notes (Signed)
Pt presents with complaints of a rash on the right side of his back around to his belly, complains of an achey itch

## 2017-08-20 NOTE — ED Provider Notes (Signed)
[facility]    CSN: [id number] Arrival date & time: 08/20/17  1929     History   Chief Complaint No chief complaint on file.   HPI Austin Stewart is a 62 y.o. male.   62 year old male comes in for 3-day history of rash on the right side of his back and is spreading around to his abdomen.  States is achy and itchy.  Sometimes with some numbness and tingling.  Denies spreading erythema, increased warmth, fever.  Denies new exposure/hygiene products.  Has not taken anything for the symptoms.     Past Medical History:  Diagnosis Date  . Allergic rhinitis, cause unspecified   . Allergy   . Cardiac dysrhythmia, unspecified   . Diverticulosis of colon (without mention of hemorrhage)   . Dysrhythmia   . Esophageal reflux   . Measles without mention of complication   . Mumps without mention of complication   . Osteoarthrosis, unspecified whether generalized or localized, unspecified site   . 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 Unspecified hyperplasia of prostate without urinary obstruction and other lower urinary tract symptoms (LUTS)     Patient Active Problem List   Diagnosis Date Noted  . Elevated serum creatinine 06/28/2017  . Trochanteric bursitis, left hip 02/01/2016  . Pre-diabetes 08/05/2015  . Seasonal allergies 08/05/2015  . Elevated lipids 08/05/2015  . Chest tightness 06/11/2015  . Cutaneous skin tags 01/05/2015  . GERD without esophagitis 12/07/2014  . BPH (benign prostatic hyperplasia) 12/07/2014  . Diastolic CHF (HCC) 06/09/2014  . Shortness of breath 06/09/2013  . H/O mitral valve repair 06/03/2012  . Heart palpitations 06/03/2012  . Dizziness 06/03/2012    Past Surgical History:  Procedure Laterality Date  . CARDIAC CATHETERIZATION  2006   [facility]   . FOOT SURGERY  03/2017   right great toe Dr.Hyatt   . HERNIA REPAIR    . MITRAL VALVE REPAIR    . rotator cuff repair    . SHOULDER  ARTHROSCOPY Left 08/24/2016   Procedure: ARTHROSCOPY SHOULDER with debridement;  Surgeon: Juanell Fairly, MD;  Location: [facility] ORS;  Service: Orthopedics;  Laterality: Left;  . TONSILLECTOMY         Home Medications    Prior to Admission medications   Medication Sig Start Date End Date Taking? Authorizing Provider  aspirin 81 MG tablet Take 81 mg by mouth daily.   Yes [provider]  baclofen (LIORESAL) 10 MG tablet Take 0.5-1 tablets (5-10 mg total) by mouth 3 (three) times daily as needed for muscle spasms. 07/10/17  Yes Karamalegos, Netta Neat, DO  fexofenadine-pseudoephedrine (ALLEGRA-D) 60-120 MG 12 hr tablet Take 1 tablet 2 (two) times daily by mouth. 03/13/17  Yes Joni Reining, PA-C  flecainide (TAMBOCOR) 50 MG tablet Take 1 tablet (50 mg total) by mouth 2 (two) times daily. 06/19/17  Yes Gollan, Tollie Pizza, MD  fluticasone (FLONASE) 50 MCG/ACT nasal spray Place 2 sprays into both nostrils daily. Dispense 3 bottles. 10/26/16  Yes Karamalegos, Netta Neat, DO  metoprolol succinate (TOPROL-XL) 25 MG 24 hr tablet Take 1 tablet (25 mg total) by mouth daily. 06/19/17  Yes Gollan, Tollie Pizza, MD  montelukast (SINGULAIR) 10 MG tablet Take 1 tablet (10 mg total) by mouth at bedtime. 10/26/16  Yes Karamalegos, Netta Neat, DO  Multiple Vitamins-Minerals (MULTIVITAMIN WITH MINERALS) tablet Take 1 tablet by mouth daily.   Yes [provider]  naphazoline-pheniramine ([employer]S EYE ALLERGY RELIEF)  0.025-0.3 % ophthalmic solution Place 1 drop into both eyes 4 (four) times daily as needed for irritation.   Yes [provider]  omeprazole (PRILOSEC) 20 MG capsule Take 1 capsule (20 mg total) by mouth daily. 06/26/17  Yes Karamalegos, Netta Neat, DO  rosuvastatin (CRESTOR) 5 MG tablet Take 1 tablet (5 mg total) by mouth every other day. 07/13/17 10/11/17 Yes Gollan, Tollie Pizza, MD  sildenafil (REVATIO) 20 MG tablet Take 1-5 pills about 30 min prior to sex. Start with 1 and increase as  needed. 10/26/16  Yes Karamalegos, Netta Neat, DO  sodium chloride (OCEAN) 0.65 % SOLN nasal spray Place 1 spray into both nostrils as needed for congestion. 12/27/15  Yes Krebs, Oreste Majeed Lauren, NP  tamsulosin (FLOMAX) 0.4 MG CAPS capsule Take 1 capsule (0.4 mg total) by mouth 2 (two) times daily. 12/12/16  Yes Jerilee Field, MD  Turmeric 500 MG CAPS Take 2 capsules by mouth daily.   Yes [provider]  Zoster Vac Recomb Adjuvanted [facility]) injection Inject 0.5 mLs into the muscle every 6 (six) months. For series of 2 injections total 10/26/16  Yes Karamalegos, Netta Neat, DO  acyclovir (ZOVIRAX) 800 MG tablet Take 1 tablet (800 mg total) by mouth 5 (five) times daily for 7 days. 08/20/17 08/27/17  Belinda Fisher, PA-C  valACYclovir (VALTREX) 1000 MG tablet Take 1 tablet (1,000 mg total) by mouth 3 (three) times daily for 7 days. 08/20/17 08/27/17  Belinda Fisher, PA-C    Family History Family History  Problem Relation Age of Onset  . Heart disease Mother        s/p stent placement  . Hypertension Mother   . Emphysema Father   . Dementia Father   . Cancer Maternal Uncle        kidney cancer    Social History Social History   Tobacco Use  . Smoking status: Never Smoker  . Smokeless tobacco: Never Used  Substance Use Topics  . Alcohol use: No  . Drug use: No     Allergies   Other   Review of Systems Review of Systems  Reason unable to perform ROS: See HPI as above.     Physical Exam Triage Vital Signs ED Triage Vitals  Enc Vitals Group     BP 08/20/17 1954 (!) 142/96     Pulse Rate 08/20/17 1954 81     Resp 08/20/17 1954 18     Temp 08/20/17 1954 98.6 F (37 C)     Temp src --      SpO2 08/20/17 1954 99 %     Weight 08/20/17 1955 195 lb (88.5 kg)     Height --      Head Circumference --      Peak Flow --      Pain Score 08/20/17 1954 0     Pain Loc --      Pain Edu? --      Excl. in GC? --    No data found.  Updated Vital Signs BP (!) 142/96   Pulse 81    Temp 98.6 F (37 C)   Resp 18   Wt 195 lb (88.5 kg)   SpO2 99%   BMI 27.98 kg/m   Physical Exam  Constitutional: He is oriented to person, place, and time. He appears well-developed and well-nourished. No distress.  HENT:  Head: Normocephalic and atraumatic.  Eyes: Pupils are equal, round, and reactive to light. Conjunctivae are normal.  Neurological: He  is alert and oriented to person, place, and time.  Skin:  See picture below of the right back.  Surrounding erythema without increased warmth.       UC Treatments / Results  Labs (all labs ordered are listed, but only abnormal results are displayed) Labs Reviewed - No data to display  EKG None Radiology No results found.  Procedures Procedures (including critical care time)  Medications Ordered in UC Medications - No data to display   Initial Impression / Assessment and Plan / UC Course  I have reviewed the triage vital signs and the nursing notes.  Pertinent labs & imaging results that were available during my care of the patient were reviewed by me and considered in my medical decision making (see chart for details).    Given appearance of rash that does not cross midline, will treat for shingles.  Start valacyclovir as directed.  Rx of acyclovir written, can fill instead of famciclovir if needed for finances.  Return precautions given.  Otherwise follow-up with PCP in 7 days for reevaluation.  Patient expresses understanding and agrees to plan.  Final Clinical Impressions(s) / UC Diagnoses   Final diagnoses:  Rash    ED Discharge Orders        Ordered    valACYclovir (VALTREX) 1000 MG tablet  3 times daily     08/20/17 2029    acyclovir (ZOVIRAX) 800 MG tablet  5 times daily     08/20/17 2030        Belinda FisherYu, Idell Hissong V, PA-C 08/20/17 2036

## 2017-08-20 NOTE — Discharge Instructions (Signed)
I am treating you for shingles.  Start valacyclovir as directed.  Follow-up with PCP in 1 week for reevaluation.  If experiencing spreading redness, increased warmth, fever, pain, follow-up for reevaluation.

## 2017-08-27 ENCOUNTER — Encounter: Payer: Self-pay | Admitting: Family Medicine

## 2017-08-27 ENCOUNTER — Ambulatory Visit (INDEPENDENT_AMBULATORY_CARE_PROVIDER_SITE_OTHER): Payer: 59 | Admitting: Family Medicine

## 2017-08-27 VITALS — BP 122/76 | HR 66 | Temp 98.0°F | Resp 16 | Ht 70.0 in | Wt 198.0 lb

## 2017-08-27 DIAGNOSIS — Z8619 Personal history of other infectious and parasitic diseases: Secondary | ICD-10-CM | POA: Diagnosis not present

## 2017-08-27 DIAGNOSIS — B029 Zoster without complications: Secondary | ICD-10-CM

## 2017-08-27 NOTE — Patient Instructions (Addendum)
Thank you for coming to the office today.  You did have Shingles flare as discussed with rash and symptoms. It is localized to that one nerve only. It should not spread.  If it continues to heal and the  Rash resolves and the pain gradually improves you are good, do not need any other treatment.  You can get Shingrix Vaccine in future once symptoms have resolved  If you feel like need some nerve pain medicine to help with deeper pain, itching, burning, call me back or send mychart message, we would likely use gabapentin  Start Gabapentin  capsules, take at night for 2-3 nights only, and then increase to 2 times a day for a few days, and then may increase to 3 times a day, it may make you drowsy, if helps significantly at night only, then you can increase instead to 3 capsules at night, instead of 3 times a day - In the future if needed, we can significantly increase the dose if tolerated well, some common doses are  three times a day up to  three times a day, usually it takes several weeks or months to get to higher doses  No longer contagious  Follow-up as needed  Please schedule a Follow-up Appointment to: Return if symptoms worsen or fail to improve, for shingles flare - keep regularly scheduled f/u.  If you have any other questions or concerns, please feel free to call the office or send a message through MyChart. You may also schedule an earlier appointment if necessary.  Additionally, you may be receiving a survey about your experience at our office within a few days to 1 week by e-mail or mail. We value your feedback.  Saralyn Pilar, DO [facility], [place]

## 2017-08-27 NOTE — Progress Notes (Signed)
Subjective:    Patient ID: Austin Stewart, male    DOB: 1955-05-20, 62 y.o.   MRN: [id number]  TRAVIN MARIK is a 62 y.o. male presenting on 08/27/2017 for Herpes Zoster (follow up from urgent care)   HPI   Urgent Care FOLLOW-UP VISIT  Hospital/Location: [facility] Date of UC Visit: 08/20/17  Reason for Presenting to UC: Shingles Primary (+Secondary) Diagnosis: Shingles  FOLLOW-UP - ED provider note and record have been reviewed - Patient presents today about 7 days after recent Urgent Care visit. Brief summary of recent course, patient had symptoms of R low back and Flank symptoms consistent with new onset shingles flare, onset week ago about 8-10 days ago, onset initially some symptoms pain itching and some deeper internal burning, on his Right low back, he thought initially bee sting, but later realized it was a spreading rash across right flank to abdomen. He went to [facility] on Monday 4/22, they diagnosed shingles and treated him with Valacyclovir  TID for 7 days, and his symptoms have improved on the medicine. He did not take Acyclovir, was given rx both depending on insurance coverage  - Today reports overall has done well after discharge from UC. He has one more day of Valacyclovir. Symptoms of rash is resolving now has minor scabs left, no more drainage oozing or other problem. He still admits to occasional mild pain deeper neuropathic "internal burning" pain mostly in more lateral and front spots, less on back, has minor itching as well. - He is not interested in any medicine rx at this time for residual pain symptoms - he never got Zostavax or Shingrix. He was planning to get Shingrix but was not in stock at pharmacy - he is unsure of potential trigger for this episode, he denies any specific acute physical or emotional stressor  - New medications on discharge: Valacyclovir  TID x 7 days, last dose 08/27/17  Denies any fever chills other rash drainage  bleeding oozing  Depression screen [facility] 2/9 07/10/2017 06/14/2016 12/14/2015  Decreased Interest 0 0 0  Down, Depressed, Hopeless 0 0 0  PHQ - 2 Score 0 0 0    Social History   Tobacco Use  . Smoking status: Never Smoker  . Smokeless tobacco: Never Used  Substance Use Topics  . Alcohol use: No  . Drug use: No    Review of Systems Per HPI unless specifically indicated above     Objective:    BP 122/76   Pulse 66   Temp 98 F (36.7 C) (Oral)   Resp 16   Ht  (1.778 m)   Wt 198 lb (89.8 kg)   BMI 28.41 kg/m   Wt Readings from Last 3 Encounters:  08/27/17 198 lb (89.8 kg)  08/20/17 195 lb (88.5 kg)  07/10/17 197 lb 9.6 oz (89.6 kg)    Physical Exam  Constitutional: He is oriented to person, place, and time. He appears well-developed and well-nourished. No distress.  Well-appearing, comfortable, cooperative  HENT:  Head: Normocephalic and atraumatic.  Mouth/Throat: Oropharynx is clear and moist.  Eyes: Conjunctivae are normal. Right eye exhibits no discharge. Left eye exhibits no discharge.  Cardiovascular: Normal rate.  Pulmonary/Chest: Effort normal.  Musculoskeletal: He exhibits no edema.  Neurological: He is alert and oriented to person, place, and time.  Skin: Skin is warm and dry. Rash (R lateral low back with several healing smaller scab like areas within a distinct pattern across flank, a few  scattered nearly resolving scabs on flank/abdomen, see picture) noted. He is not diaphoretic. No erythema.  Psychiatric: He has a normal mood and affect. His behavior is normal.  Well groomed, good eye contact, normal speech and thoughts  Nursing note and vitals reviewed.     Right Low Back / Flank     Results for orders placed or performed in visit on 07/12/17  BASIC METABOLIC PANEL WITH GFR  Result Value Ref Range   Glucose, Bld 103 (H) 65 - 99 mg/dL   BUN 16 7 - 25 mg/dL   Creat 6.96 2.95 - 2.84 mg/dL   GFR, Est Non African American 67 > OR = 60  mL/min/1.85m2   GFR, Est African American 78 > OR = 60 mL/min/1.76m2   BUN/Creatinine Ratio NOT APPLICABLE 6 - 22 (calc)   Sodium 141 135 - 146 mmol/L   Potassium 5.0 3.5 - 5.3 mmol/L   Chloride 108 98 - 110 mmol/L   CO2 28 20 - 32 mmol/L   Calcium 9.6 8.6 - 10.3 mg/dL      Assessment & Plan:   Problem List Items Addressed This Visit    None    Visit Diagnoses    Herpes zoster without complication    -  Primary     Clinically consistent with new acute shingles herpes zoster outbreak onset 7 days ago, with resolving rash s/p valacyclovir from urgent care, and now with residual mild neuropathic pain. - Never received zoster vaccine - No evidence of complications  Plan: 1. Finish Valacyclovir  TID with food x 7 days - last dose today 2. Discussed additional neuropathic pain med options such as gabapentin, briefly other options but not focused TCA, Tramadol PRN however mutual decision hold off on these for now due to mild symptoms and well tolerated 3. He is likely no longer contagious at this time, - reviewed counseling on infectious nature of problem with avoiding close contact with high risk populations 4. Follow-up as needed if not resolving or if residual pain, strict return criteria if ocular involvement or other acute concerns     No orders of the defined types were placed in this encounter.   Follow up plan: Return if symptoms worsen or fail to improve, for shingles flare - keep regularly scheduled f/u.  Saralyn Pilar, DO [facility] 08/27/2017, 8:52 AM

## 2017-10-03 ENCOUNTER — Encounter: Payer: Self-pay | Admitting: Podiatry

## 2017-10-03 ENCOUNTER — Ambulatory Visit (INDEPENDENT_AMBULATORY_CARE_PROVIDER_SITE_OTHER): Payer: 59 | Admitting: Podiatry

## 2017-10-03 ENCOUNTER — Ambulatory Visit (INDEPENDENT_AMBULATORY_CARE_PROVIDER_SITE_OTHER): Payer: 59

## 2017-10-03 DIAGNOSIS — M779 Enthesopathy, unspecified: Secondary | ICD-10-CM | POA: Diagnosis not present

## 2017-10-03 DIAGNOSIS — M722 Plantar fascial fibromatosis: Secondary | ICD-10-CM | POA: Diagnosis not present

## 2017-10-03 MED ORDER — MELOXICAM 15 MG PO TABS: 15.0000 mg | ORAL_TABLET | Freq: Every day | ORAL | 3 refills | Status: DC

## 2017-10-03 NOTE — Patient Instructions (Signed)

## 2017-10-03 NOTE — Progress Notes (Signed)
He presents today chief complaint of pain to the plantar lateral aspect of his left foot.  States is been going on now for the past 3 to 4 months.  He states sometimes a stabbing pain particularly when walking or laying down and walking with socks rather than shoes to make it worse.  Objective: Vital signs are stable he is alert and oriented x3 pulses are palpable.  Neurologic sensorium is intact.  Degenerative flexors are intact.  Muscle strength normal symmetrical.  Orthopedic evaluation demonstrates all joints distal to ankle full range of motion without crepitus.  He has pain on palpation medial calcaneal tubercle of the left heel and some tenderness with a boggy sensation beneath the fifth metatarsal base of the left foot.  There is no open lesions no reactive hyperkeratosis.  Radiographs taken today demonstrate no acute findings other than what appears to b more of a chronic proximal plantar fasciitis at the insertion site of the plantar fascia on the calcaneus.  Fifth metatarsal appears to be normal with some soft tissue swelling.  Assessment: Plantar fasciitis with lateral compensatory syndrome resulting in bursitis of the sub-fifth metatarsal base left foot.  Plan: Discussed etiology pathology conservative versus surgical therapies.  Started him on a One-A-Day anti-inflammatory meloxicam 15 mg 1 p.o. daily #30 with 3 refills.  Also injected 20 mg Kenalog 5 mg Marcaine to the medial aspect of the left heel.  He tolerated procedure well without complications.  Also placed him in a plantar fascial brace.

## 2017-10-31 ENCOUNTER — Ambulatory Visit: Payer: 59 | Admitting: Podiatry

## 2017-11-26 ENCOUNTER — Other Ambulatory Visit: Payer: Self-pay | Admitting: Family Medicine

## 2017-11-26 DIAGNOSIS — J3089 Other allergic rhinitis: Secondary | ICD-10-CM

## 2017-11-27 MED FILL — MELOXICAM 15 MG TABLET: 15 | 30 days supply | Qty: 30 | Fill #0

## 2017-12-03 ENCOUNTER — Telehealth: Payer: 59 | Admitting: Family

## 2017-12-03 DIAGNOSIS — J019 Acute sinusitis, unspecified: Secondary | ICD-10-CM | POA: Diagnosis not present

## 2017-12-03 MED ORDER — AMOXICILLIN-POT CLAVULANATE 875-125 MG PO TABS: 1.0000 | ORAL_TABLET | Freq: Two times a day (BID) | ORAL | 0 refills | Status: DC

## 2017-12-03 NOTE — Progress Notes (Signed)

## 2017-12-11 ENCOUNTER — Encounter: Payer: Self-pay | Admitting: Family Medicine

## 2017-12-11 ENCOUNTER — Ambulatory Visit (INDEPENDENT_AMBULATORY_CARE_PROVIDER_SITE_OTHER): Payer: Self-pay | Admitting: Family Medicine

## 2017-12-11 VITALS — BP 132/90 | HR 77 | Temp 98.1°F | Wt 191.0 lb

## 2017-12-11 DIAGNOSIS — R0981 Nasal congestion: Secondary | ICD-10-CM

## 2017-12-11 MED ORDER — LEVOCETIRIZINE DIHYDROCHLORIDE 5 MG PO TABS: 5.0000 mg | ORAL_TABLET | Freq: Every evening | ORAL | 0 refills | Status: DC

## 2017-12-11 MED ORDER — BENZONATATE 100 MG PO CAPS
100.0000 mg | ORAL_CAPSULE | Freq: Three times a day (TID) | ORAL | 0 refills | Status: DC | PRN
Start: 2017-12-11 — End: 2018-01-11

## 2017-12-11 MED ORDER — IPRATROPIUM BROMIDE 0.03 % NA SOLN: 2.0000 | Freq: Three times a day (TID) | NASAL | 0 refills | Status: DC

## 2017-12-11 NOTE — Progress Notes (Signed)
Patient ID: Austin Stewart, male    DOB: 09/27/1955, 62 y.o.   MRN: [id number]  PCP: Smitty CordsKaramalegos, Alexander J, DO  Chief Complaint  Patient presents with  . choice-cough/cong/green mucus    had evisit one week ago given amox completed regiment yesterday also mucinex     Subjective:  HPI Austin MarrowRobert D Stewart is a 62 y.o. male presents for evaluation of sinusitis.   SINUSITIS Onset: 2 weeks  Facial/sinus pressure with discolored nasal mucus.    Severity: moderate Recently completed a 10 day course of Augmentin. Initially symptoms resolved. Today, reports continued nasal and facial pressure, mild HA, and cough secondary to PND.  No known history of allergies, asthma, or COPD  Symptoms:  - Fever  + URI prodrome with nasal congestion + mild Sinus Headache + mild ear pressure  No Allergy symptoms No significant Sore Throat No eye symptoms     No chest pain No shortness of breath  No wheezing  No nausea No Vomiting No diarrhea   Remainder of Review of Systems negative except as noted in the HPI.   Social History   Socioeconomic History  . Marital status: Married    Spouse name: Not on file  . Number of children: Not on file  . Years of education: Not on file  . Highest education level: Not on file  Occupational History  . Not on file  Social Needs  . Financial resource strain: Not on file  . Food insecurity:    Worry: Not on file    Inability: Not on file  . Transportation needs:    Medical: Not on file    Non-medical: Not on file  Tobacco Use  . Smoking status: Never Smoker  . Smokeless tobacco: Never Used  Substance and Sexual Activity  . Alcohol use: No  . Drug use: No  . Sexual activity: Not on file  Lifestyle  . Physical activity:    Days per week: Not on file    Minutes per session: Not on file  . Stress: Not on file  Relationships  . Social connections:    Talks on phone: Not on file    Gets together: Not on file    Attends religious service: Not  on file    Active member of club or organization: Not on file    Attends meetings of clubs or organizations: Not on file    Relationship status: Not on file  . Intimate partner violence:    Fear of current or ex partner: Not on file    Emotionally abused: Not on file    Physically abused: Not on file    Forced sexual activity: Not on file  Other Topics Concern  . Not on file  Social History Narrative  . Not on file    Family History  Problem Relation Age of Onset  . Heart disease Mother        s/p stent placement  . Hypertension Mother   . Emphysema Father   . Dementia Father   . Cancer Maternal Uncle        kidney cancer   Review of Systems Pertinent negatives listed in HPI  Patient Active Problem List   Diagnosis Date Noted  . History of shingles 08/27/2017  . Elevated serum creatinine 06/28/2017  . Trochanteric bursitis, left hip 02/01/2016  . Pre-diabetes 08/05/2015  . Seasonal allergies 08/05/2015  . Elevated lipids 08/05/2015  . Chest tightness 06/11/2015  . Cutaneous skin tags 01/05/2015  .  GERD without esophagitis 12/07/2014  . BPH (benign prostatic hyperplasia) 12/07/2014  . Diastolic CHF (HCC) 06/09/2014  . Shortness of breath 06/09/2013  . H/O mitral valve repair 06/03/2012  . Heart palpitations 06/03/2012  . Dizziness 06/03/2012    Allergies  Allergen Reactions  . Other Swelling    PEACHES  SWELLING OF FACE    Prior to Admission medications   Medication Sig Start Date End Date Taking? Authorizing Provider  aspirin 81 MG tablet Take 81 mg by mouth daily.   Yes [provider]  fexofenadine-pseudoephedrine (ALLEGRA-D) 60-120 MG 12 hr tablet Take 1 tablet 2 (two) times daily by mouth. 03/13/17  Yes Joni Reining, PA-C  flecainide (TAMBOCOR) 50 MG tablet Take 1 tablet (50 mg total) by mouth 2 (two) times daily. 06/19/17  Yes [name], MD  fluticasone (FLONASE) 50 MCG/ACT nasal spray PLACE 2 SPRAYS INTO BOTH NOSTRILS DAILY 11/26/17   Yes Karamalegos, Netta Neat, DO  meloxicam (MOBIC) 15 MG tablet Take 1 tablet (15 mg total) by mouth daily. 10/03/17  Yes Hyatt, Max T, DPM  metoprolol succinate (TOPROL-XL) 25 MG 24 hr tablet Take 1 tablet (25 mg total) by mouth daily. 06/19/17  Yes Gollan, Tollie Pizza, MD  montelukast (SINGULAIR) 10 MG tablet Take 1 tablet (10 mg total) by mouth at bedtime. 10/26/16  Yes Karamalegos, Netta Neat, DO  Multiple Vitamins-Minerals (MULTIVITAMIN WITH MINERALS) tablet Take 1 tablet by mouth daily.   Yes [provider]  omeprazole (PRILOSEC) 20 MG capsule Take 1 capsule (20 mg total) by mouth daily. 06/26/17  Yes Karamalegos, Netta Neat, DO  sodium chloride (OCEAN) 0.65 % SOLN nasal spray Place 1 spray into both nostrils as needed for congestion. 12/27/15  Yes Krebs, Amy Lauren, NP  tamsulosin (FLOMAX) 0.4 MG CAPS capsule Take 1 capsule (0.4 mg total) by mouth 2 (two) times daily. 12/12/16  Yes Jerilee Field, MD  Turmeric 500 MG CAPS Take 2 capsules by mouth daily.   Yes [provider]  amoxicillin-clavulanate (AUGMENTIN) 875-125 MG tablet Take 1 tablet by mouth 2 (two) times daily. Patient not taking: Reported on 12/11/2017 12/03/17   Junie Spencer, FNP  baclofen (LIORESAL) 10 MG tablet Take 0.5-1 tablets (5-10 mg total) by mouth 3 (three) times daily as needed for muscle spasms. Patient not taking: Reported on 12/11/2017 07/10/17   Smitty Cords, DO  naphazoline-pheniramine ([employer] EYE ALLERGY RELIEF) 0.025-0.3 % ophthalmic solution Place 1 drop into both eyes 4 (four) times daily as needed for irritation.    [provider]  rosuvastatin (CRESTOR) 5 MG tablet Take 1 tablet (5 mg total) by mouth every other day. 07/13/17 10/11/17  [name], MD  sildenafil (REVATIO) 20 MG tablet Take 1-5 pills about 30 min prior to sex. Start with 1 and increase as needed. Patient not taking: Reported on 12/11/2017 10/26/16   Smitty Cords, DO  Zoster Vac Recomb Adjuvanted  [facility]) injection Inject 0.5 mLs into the muscle every 6 (six) months. For series of 2 injections total Patient not taking: Reported on 12/11/2017 10/26/16   Smitty Cords, DO    Past Medical, Surgical Family and Social History reviewed and updated.    Objective:   Today's Vitals   12/11/17 0837  BP: 132/90  Pulse: 77  Temp: 98.1 F (36.7 C)  SpO2: 97%  Weight: 191 lb (86.6 kg)    Wt Readings from Last 3 Encounters:  12/11/17 191 lb (86.6 kg)  08/27/17 198 lb (89.8 kg)  08/20/17 195 lb (88.5 kg)    Physical Exam  He appears well, vital signs are as noted. Ears normal. Throat and pharynx normal.  Neck supple. No adenopathy in the neck. Nose is congested. Sinuses non-tender, mildly erythematous. The chest is clear, without wheezes or rales.   Assessment & Plan:  1. Sinus congestion, residual symptoms from recent sinusitis. -Discontinue Mucinex and Allegra -Start Levocetirizine for antihistamine therapy and Atrovent nasal spray for nasal congestion relief. -Benzonatate for cough   If symptoms worsen or do not improve, return for follow-up, follow-up with PCP, or at the emergency department if severity of symptoms warrant a higher level of care.   Godfrey PickKimberly S. Tiburcio PeaHarris, MSN, FNP-C [facility]  [address]  [place], [place] [address] [phone]

## 2017-12-11 NOTE — Patient Instructions (Signed)
Levocetirizine 5 mg at bedtime.    Ipratropium (Atrovent) 2 sprays, twice daily. For appropriate administration of the nasal spray, clear the nose, use opposite hand for opposite nare, sniff gently, exhale through your mouth.    Take Benzonatate 100-200 mg 3 times daily as needed.     Sinusitis, Adult Sinusitis is soreness and inflammation of your sinuses. Sinuses are hollow spaces in the bones around your face. They are located:  Around your eyes.  In the middle of your forehead.  Behind your nose.  In your cheekbones.  Your sinuses and nasal passages are lined with a stringy fluid (mucus). Mucus normally drains out of your sinuses. When your nasal tissues get inflamed or swollen, the mucus can get trapped or blocked so air cannot flow through your sinuses. This lets bacteria, viruses, and funguses grow, and that leads to infection. Follow these instructions at home: Medicines  Take, use, or apply over-the-counter and prescription medicines only as told by your doctor. These may include nasal sprays.  If you were prescribed an antibiotic medicine, take it as told by your doctor. Do not stop taking the antibiotic even if you start to feel better. Hydrate and Humidify  Drink enough water to keep your pee (urine) clear or pale yellow.  Use a cool mist humidifier to keep the humidity level in your home above 50%.  Breathe in steam for 10-15 minutes, 3-4 times a day or as told by your doctor. You can do this in the bathroom while a hot shower is running.  Try not to spend time in cool or dry air. Rest  Rest as much as possible.  Sleep with your head raised (elevated).  Make sure to get enough sleep each night. General instructions  Put a warm, moist washcloth on your face 3-4 times a day or as told by your doctor. This will help with discomfort.  Wash your hands often with soap and water. If there is no soap and water, use hand sanitizer.  Do not smoke. Avoid being around  people who are smoking (secondhand smoke).  Keep all follow-up visits as told by your doctor. This is important. Contact a doctor if:  You have a fever.  Your symptoms get worse.  Your symptoms do not get better within 10 days. Get help right away if:  You have a very bad headache.  You cannot stop throwing up (vomiting).  You have pain or swelling around your face or eyes.  You have trouble seeing.  You feel confused.  Your neck is stiff.  You have trouble breathing. This information is not intended to replace advice given to you by your health care provider. Make sure you discuss any questions you have with your health care provider. Document Released: 10/04/2007 Document Revised: 12/12/2015 Document Reviewed: 02/10/2015 Elsevier Interactive Patient Education  2018 Else[employer] Inc. Postnasal Drip Postnasal drip is the feeling of mucus going down the back of your throat. Mucus is a slimy substance that moistens and cleans your nose and throat, as well as the air pockets in face bones near your forehead and cheeks (sinuses). Small amounts of mucus pass from your nose and sinuses down the back of your throat all the time. This is normal. When you produce too much mucus or the mucus gets too thick, you can feel it. Some common causes of postnasal drip include:  Having more mucus because of: ? A cold or the flu. ? Allergies. ? Cold air. ? Certain medicines.  Having  more mucus that is thicker because of: ? A sinus or nasal infection. ? Dry air. ? A food allergy.  Follow these instructions at home: Relieving discomfort  Gargle with a salt-water mixture 3-4 times a day or as needed. To make a salt-water mixture, completely dissolve -1 tsp of salt in 1 cup of warm water.  If the air in your home is dry, use a humidifier to add moisture to the air.  Use a saline spray or container (neti pot) to flush out the nose (nasal irrigation). These methods can help clear away mucus and  keep the nasal passages moist. General instructions  Take over-the-counter and prescription medicines only as told by your health care provider.  Follow instructions from your health care provider about eating or drinking restrictions. You may need to avoid caffeine.  Avoid things that you know you are allergic to (allergens), like dust, mold, pollen, pets, or certain foods.  Drink enough fluid to keep your urine pale yellow.  Keep all follow-up visits as told by your health care provider. This is important. Contact a health care provider if:  You have a fever.  You have a sore throat.  You have difficulty swallowing.  You have headache.  You have sinus pain.  You have a cough that does not go away.  The mucus from your nose becomes thick and is green or yellow in color.  You have cold or flu symptoms that last more than 10 days. Summary  Postnasal drip is the feeling of mucus going down the back of your throat.  If your health care provider approves, use nasal irrigation or a nasal spray 2?4 times a day.  Avoid things that you know you are allergic to (allergens), like dust, mold, pollen, pets, or certain foods. This information is not intended to replace advice given to you by your health care provider. Make sure you discuss any questions you have with your health care provider. Document Released: 07/31/2016 Document Revised: 07/31/2016 Document Reviewed: 07/31/2016 Elsevier Interactive Patient Education  [employer]2018 [employer] Inc.

## 2017-12-12 DIAGNOSIS — Z125 Encounter for screening for malignant neoplasm of prostate: Secondary | ICD-10-CM | POA: Diagnosis not present

## 2017-12-12 DIAGNOSIS — R350 Frequency of micturition: Secondary | ICD-10-CM | POA: Diagnosis not present

## 2017-12-12 DIAGNOSIS — N401 Enlarged prostate with lower urinary tract symptoms: Secondary | ICD-10-CM | POA: Diagnosis not present

## 2017-12-12 DIAGNOSIS — R3914 Feeling of incomplete bladder emptying: Secondary | ICD-10-CM | POA: Diagnosis not present

## 2017-12-14 ENCOUNTER — Telehealth: Payer: Self-pay | Admitting: Emergency Medicine

## 2017-12-14 ENCOUNTER — Other Ambulatory Visit: Payer: Self-pay | Admitting: Family Medicine

## 2017-12-14 DIAGNOSIS — J3089 Other allergic rhinitis: Secondary | ICD-10-CM

## 2017-12-14 DIAGNOSIS — K219 Gastro-esophageal reflux disease without esophagitis: Secondary | ICD-10-CM

## 2017-12-14 MED FILL — METOPROLOL SUCCINATE ER 25: 25 | 90 days supply | Qty: 90 | Fill #0

## 2017-12-14 MED FILL — FLECAINIDE ACETATE 50 MG TA: 50 | 90 days supply | Qty: 180 | Fill #0

## 2017-12-14 NOTE — Telephone Encounter (Signed)
Left message in regards to visit with [facility]. Follow up call

## 2017-12-17 MED FILL — MONTELUKAST SOD 10 MG TAB: 10 | 90 days supply | Qty: 90 | Fill #0

## 2017-12-17 MED FILL — OMEPRAZOLE 20 MG CPDR: 20 | 90 days supply | Qty: 90 | Fill #0

## 2017-12-25 MED FILL — MELOXICAM 15 MG TABLET: 15 | 30 days supply | Qty: 30 | Fill #1

## 2017-12-28 ENCOUNTER — Other Ambulatory Visit: Payer: Self-pay

## 2017-12-28 DIAGNOSIS — R3912 Poor urinary stream: Principal | ICD-10-CM

## 2017-12-28 DIAGNOSIS — N401 Enlarged prostate with lower urinary tract symptoms: Secondary | ICD-10-CM

## 2017-12-28 MED ORDER — TAMSULOSIN HCL 0.4 MG PO CAPS: 0.4000 mg | ORAL_CAPSULE | Freq: Two times a day (BID) | ORAL | 3 refills | Status: DC

## 2017-12-28 MED FILL — TAMSULOSIN HCL 0.4 MG CAP: 0.4 | 90 days supply | Qty: 180 | Fill #0

## 2017-12-28 NOTE — Telephone Encounter (Signed)
RX provided to last pt until appointment

## 2018-01-02 DIAGNOSIS — R338 Other retention of urine: Secondary | ICD-10-CM | POA: Diagnosis not present

## 2018-01-02 DIAGNOSIS — N401 Enlarged prostate with lower urinary tract symptoms: Secondary | ICD-10-CM | POA: Diagnosis not present

## 2018-01-04 DIAGNOSIS — R338 Other retention of urine: Secondary | ICD-10-CM | POA: Diagnosis not present

## 2018-01-04 DIAGNOSIS — N401 Enlarged prostate with lower urinary tract symptoms: Secondary | ICD-10-CM | POA: Diagnosis not present

## 2018-01-07 DIAGNOSIS — N401 Enlarged prostate with lower urinary tract symptoms: Secondary | ICD-10-CM | POA: Diagnosis not present

## 2018-01-07 DIAGNOSIS — R338 Other retention of urine: Secondary | ICD-10-CM | POA: Diagnosis not present

## 2018-01-08 ENCOUNTER — Encounter
Admission: RE | Admit: 2018-01-08 | Discharge: 2018-01-08 | Disposition: A | Discharge status: Home / Self Care | Payer: 59 | Source: Ambulatory Visit | Attending: Urology | Admitting: Urology

## 2018-01-08 ENCOUNTER — Other Ambulatory Visit: Payer: Self-pay

## 2018-01-08 NOTE — Patient Instructions (Signed)
Your procedure is scheduled on: 01-15-18 TUESDAY Report to Same Day Surgery 2nd floor medical mall [facility] Entrance-take elevator on left to 2nd floor.  Check in with surgery information desk.) To find out your arrival time please call [phone] between 1PM - 3PM on 01-14-18 MONDAY  Remember: Instructions that are not followed completely may result in serious medical risk, up to and including death, or upon the discretion of your surgeon and anesthesiologist your surgery may need to be rescheduled.    _x___ 1. Do not eat food after midnight the night before your procedure. NO GUM OR CANDY AFTER MIDNIGHT.  You may drink clear liquids up to 2 hours before you are scheduled to arrive at the hospital for your procedure.  Do not drink clear liquids within 2 hours of your scheduled arrival to the hospital.  Clear liquids include  --Water or Apple juice without pulp  --Clear carbohydrate beverage such as ClearFast or Gatorade  --Black Coffee or Clear Tea (No milk, no creamers, do not add anything to the coffee or Tea   ____Ensure clear carbohydrate drink on the way to the hospital for bariatric patients  ____Ensure clear carbohydrate drink 3 hours before surgery for Dr Rutherford Nail patients if physician instructed.     __x__ 2. No Alcohol for 24 hours before or after surgery.   __x__3. No Smoking or e-cigarettes for 24 prior to surgery.  Do not use any chewable tobacco products for at least 6 hour prior to surgery   ____  4. Bring all medications with you on the day of surgery if instructed.    __x__ 5. Notify your doctor if there is any change in your medical condition     (cold, fever, infections).    x___6. On the morning of surgery brush your teeth with toothpaste and water.  You may rinse your mouth with mouth wash if you wish.  Do not swallow any toothpaste or mouthwash.   Do not wear jewelry, make-up, hairpins, clips or nail polish.  Do not wear lotions, powders, or perfumes.  You may wear deodorant.  Do not shave 48 hours prior to surgery. Men may shave face and neck.  Do not bring valuables to the hospital.    [facility] is not responsible for any belongings or valuables.               Contacts, dentures or bridgework may not be worn into surgery.  Leave your suitcase in the car. After surgery it may be brought to your room.  For patients admitted to the hospital, discharge time is determined by your treatment team.  _  Patients discharged the day of surgery will not be allowed to drive home.  You will need someone to drive you home and stay with you the night of your procedure.    Please read over the following fact sheets that you were given:   [facility] Preparing for Surgery   _x___ TAKE THE FOLLOWING MEDICATION THE MORNING OF SURGERY WITH A SMALL SIP OF WATER. These include:  1. FLECAINIDE  2. PRILOSEC (OMEPRAZOLE)  3. TAKE AN EXTRA PRILOSEC THE NIGHT BEFORE YOUR SURGERY  4.  5.  6.  ____Fleets enema or Magnesium Citrate as directed.   ____ Use CHG Soap or sage wipes as directed on instruction sheet   ____ Use inhalers on the day of surgery and bring to hospital day of surgery  ____ Stop Metformin and Janumet 2 days prior to surgery.  ____ Take 1/2 of usual insulin dose the night before surgery and none on the morning surgery.   _x___ Follow recommendations from Cardiologist, Pulmonologist or PCP regarding stopping Aspirin, Coumadin, Plavix ,Eliquis, Effient, or Pradaxa, and Pletal-PT HAS ALREADY STOPPED HIS ASPIRIN  X____Stop Anti-inflammatories such as Advil, Aleve, Ibuprofen, Motrin, Naproxen, MELOXICAM,  Naprosyn, Goodies powders or aspirin products NOW-OK to take Tylenol    _x___ Stop supplements until after surgery-PT HAS ALREADY STOPPED TURMERIC AND CBD OIL   ____ Bring C-Pap to the hospital.

## 2018-01-08 NOTE — H&P (Signed)
NAME: Austin Stewart, MOTOLA MEDICAL RECORD UK:[id number] ACCOUNT [id number] DATE OF BIRTH:24-Feb-1956 FACILITY: [facility] LOCATION: [facility] PHYSICIAN:Emsley Custer Gilles Chiquito, MD  HISTORY AND PHYSICAL  DATE OF ADMISSION:  01/15/2018  DATE OF SURGERY:  09/17  CHIEF COMPLAINT:  Urinary retention.  HISTORY OF PRESENT ILLNESS:  The patient is a 62 year old Caucasian male with a 15-year history of BPH and lower urinary tract symptoms.  Currently, he is being managed with tamsulosin 0.4 mg b.i.d.  IPS score was 24 with a quality of life score of 3.   He was evaluated in the office with a Uroflow study and was found to have retention and had over 430 mL in his bladder.  Foley catheter was placed and then removed a week later.  His urinary symptoms did not improve.  He was further evaluated in the  office with cystoscopy on 09/04 indicating trilobar BPH with an obstructive median lobe at the bladder neck.  The patient comes in now for photovaporization of prostate with a GreenLight laser.  ALLERGIES:  No drug allergies.  CURRENT MEDICATIONS:  Tambocor, Xyzal, Mobic, Toprol-XL, Singulair, Prilosec, Crestor, tamsulosin and turmeric.  PAST SURGICAL HISTORY: 1.  Repair of mitral valve in 2006. 2.  Bilateral inguinal herniorrhaphy in 2007. 3.  Reconstructive right foot surgery in 2018. 4.  Left rotator cuff repair in 2018.  PAST AND CURRENT MEDICAL CONDITIONS:   1.  Hypercholesterolemia. 2.  GERD. 3.  History of mitral valve disease. 4.  BPH with lower urinary tract symptoms. 5.  Seasonal allergies.  REVIEW OF SYSTEMS:  The patient reports decreased visual acuity.  He denied chest pain, shortness of breath, diabetes, stroke or hypertension.  SOCIAL HISTORY:  The patient denied tobacco or alcohol use.  FAMILY HISTORY:  Father died at age 35 of natural causes.  Mother is living at age 85 with hypertension.  PHYSICAL EXAMINATION: GENERAL:  A well-nourished, white male in no acute distress. HEENT:   Sclerae were clear.  Pupils are equally round, reactive to light and accommodation.  Extraocular movements are intact. NECK:  No palpable cervical adenopathy.  No audible carotid bruits. PULMONARY:  Lungs clear to auscultation. CARDIOVASCULAR:  Regular rhythm and rate. ABDOMEN:  Soft, nontender abdomen. GENITOURINARY:  Circumcised, testes smooth, nontender, 18 mL in size each. RECTAL:  A 30 gram smooth, nontender prostate. NEUROMUSCULAR:  Grossly intact.  Prostate ultrasound dated 09/06 indicated at 31.9 gram prostate with trilobar BPH.  PSA was 0.5 ng/mL and creatinine 1.13 mg per deciliter on 08/14.  IMPRESSION:  Benign prostatic hypertrophy with bladder outlet obstruction.  PLAN:  Photovaporization of prostate with GreenLight laser.  TN/NUANCE  D:01/07/2018 T:01/07/2018 JOB:[id number]

## 2018-01-09 ENCOUNTER — Encounter
Admission: RE | Admit: 2018-01-09 | Discharge: 2018-01-09 | Disposition: A | Discharge status: Home / Self Care | Payer: 59 | Source: Ambulatory Visit | Attending: Urology | Admitting: Urology

## 2018-01-09 DIAGNOSIS — Z01818 Encounter for other preprocedural examination: Secondary | ICD-10-CM | POA: Diagnosis not present

## 2018-01-09 DIAGNOSIS — I499 Cardiac arrhythmia, unspecified: Secondary | ICD-10-CM | POA: Insufficient documentation

## 2018-01-09 LAB — BASIC METABOLIC PANEL
Anion gap: 7 (ref 5–15)
BUN: 28 mg/dL — ABNORMAL HIGH (ref 8–23)
CALCIUM: 8.9 mg/dL (ref 8.9–10.3)
CO2: 24 mmol/L (ref 22–32)
Chloride: 110 mmol/L (ref 98–111)
Creatinine, Ser: 0.8 mg/dL (ref 0.61–1.24)
Glucose, Bld: 106 mg/dL — ABNORMAL HIGH (ref 70–99)
Potassium: 4.2 mmol/L (ref 3.5–5.1)
SODIUM: 141 mmol/L (ref 135–145)

## 2018-01-09 LAB — CBC
HCT: 39.7 % — ABNORMAL LOW (ref 40.0–52.0)
Hemoglobin: 13.8 g/dL (ref 13.0–18.0)
MCH: 30.9 pg (ref 26.0–34.0)
MCHC: 34.8 g/dL (ref 32.0–36.0)
MCV: 88.6 fL (ref 80.0–100.0)
PLATELETS: 288 10*3/uL (ref 150–440)
RBC: 4.47 MIL/uL (ref 4.40–5.90)
RDW: 14.3 % (ref 11.5–14.5)
WBC: 8.2 10*3/uL (ref 3.8–10.6)

## 2018-01-11 ENCOUNTER — Encounter: Payer: Self-pay | Admitting: Family Medicine

## 2018-01-11 ENCOUNTER — Ambulatory Visit (INDEPENDENT_AMBULATORY_CARE_PROVIDER_SITE_OTHER): Payer: 59 | Admitting: Family Medicine

## 2018-01-11 ENCOUNTER — Other Ambulatory Visit: Payer: Self-pay | Admitting: Family Medicine

## 2018-01-11 VITALS — BP 134/75 | HR 68 | Temp 98.5°F | Resp 16 | Ht 70.0 in | Wt 189.0 lb

## 2018-01-11 DIAGNOSIS — M159 Polyosteoarthritis, unspecified: Secondary | ICD-10-CM

## 2018-01-11 DIAGNOSIS — E785 Hyperlipidemia, unspecified: Secondary | ICD-10-CM

## 2018-01-11 DIAGNOSIS — M150 Primary generalized (osteo)arthritis: Secondary | ICD-10-CM | POA: Diagnosis not present

## 2018-01-11 DIAGNOSIS — R7989 Other specified abnormal findings of blood chemistry: Secondary | ICD-10-CM

## 2018-01-11 DIAGNOSIS — R3914 Feeling of incomplete bladder emptying: Secondary | ICD-10-CM

## 2018-01-11 DIAGNOSIS — N401 Enlarged prostate with lower urinary tract symptoms: Secondary | ICD-10-CM

## 2018-01-11 DIAGNOSIS — J302 Other seasonal allergic rhinitis: Secondary | ICD-10-CM | POA: Diagnosis not present

## 2018-01-11 DIAGNOSIS — Z0000 Encounter for general adult medical examination without abnormal findings: Secondary | ICD-10-CM

## 2018-01-11 DIAGNOSIS — Z114 Encounter for screening for human immunodeficiency virus [HIV]: Secondary | ICD-10-CM

## 2018-01-11 DIAGNOSIS — R3912 Poor urinary stream: Secondary | ICD-10-CM | POA: Diagnosis not present

## 2018-01-11 DIAGNOSIS — R7303 Prediabetes: Secondary | ICD-10-CM

## 2018-01-11 MED ORDER — LEVOCETIRIZINE DIHYDROCHLORIDE 5 MG PO TABS: 5.0000 mg | ORAL_TABLET | Freq: Every evening | ORAL | 5 refills | Status: DC

## 2018-01-11 MED ORDER — MELOXICAM 15 MG PO TABS: 15.0000 mg | ORAL_TABLET | Freq: Every day | ORAL | 5 refills | Status: DC | PRN

## 2018-01-11 NOTE — Progress Notes (Signed)
Subjective:    Patient ID: Austin Stewart, male    DOB: 24-Mar-1956, 62 y.o.   MRN: [id number]  Austin Stewart is a 62 y.o. male presenting on 01/11/2018 for Hyperlipidemia and Benign Prostatic Hypertrophy   HPI  History of Elevated Creatinine Prior labs range Cr 1.1 to 1.3. Recently he had lab done pre-op from Dr Evelene Croon [facility], earlier this week 9/11 showed Cr 0.8, after over a week wearing a foley catheter due to his urinary retention. - Also he has stopped Meloxicam NSAID recently, see below - No new concerns on creatinine today  Osteoarthritis Multiple joints, Left Hip Pain / Plantar Fasciitis Chronic problem, has been functioning fairly well on medication management. Not followed by Ortho but has seen Podiatry for foot plantar fascia, received injection and meloxicam refill from them. - Currently he is taking Meloxicam 15mg  daily previously for NSAID for arthritis, and from podiatry - Also taking CBD Oil and Tylenol. He believes better pain control and better sleep and digestive health on CBD - TODAY he is off Meloxicam due to upcoming procedure - Still has aches in L hip at times flare up, will wake him up sometimes at night with pain, other joints stiff and ache at times. - Not taken other meds before such as Gabapentin or Tramadol. - He has taken muscle relaxant, in past, Baclofen causes sedation can only take at night, he prefers to avoid this. Denies any trauma or injury or twisting, redness swelling numbness   History of HLD Followed by Cardiology. Prior history of mild elevated lipids, has been stable and controlled now We have advised him in past to consider Statin for ASCVD prevention. He has followed up with Cards since our last visit and now he is taking Rosuvastatin 5mg  every other day He had intolerance or side effect on Atorvastatin.  BPH, with LUTS Interval update - he has switched Urology care to Dr Anola Gurney locally, he had testing with Cystoscopy and  Ultrasound of Prostate. He considered Urilyft procedure, but ultimately he determined that the prostate was "pushing" on prostate. He had high PVR 300cc, he had catheter in for 10 days, then removed. He thinks improve kidney function as a result of empty bladder less "Straining" - Now he is scheduled for surgery next week 9/17 for Green light laser therapy for prostate Taking Flomax Tamsulosin 0.4mg  twice a day, rx from Dr Lonna Cobb, hopes to reduce to once a day or none in future - Dr Evelene Croon also checked PSA for him - he will ask to send records to [place].  Allergies He was taking xyzal before with good results. Also was on flonase singulair.  HM:  Due flu shot, declines today bc of procedure next week urology. He will return to our office for vaccine, or get at work in October and send [place] record.  Depression screen [facility] 2/9 01/11/2018 07/10/2017 06/14/2016  Decreased Interest 0 0 0  Down, Depressed, Hopeless 0 0 0  PHQ - 2 Score 0 0 0    Social History   Tobacco Use  . Smoking status: Never Smoker  . Smokeless tobacco: Never Used  Substance Use Topics  . Alcohol use: No  . Drug use: No    Review of Systems Per HPI unless specifically indicated above     Objective:    BP 134/75   Pulse 68   Temp 98.5 F (36.9 C) (Oral)   Resp 16   Ht 5\' 10"  (1.778 m)   Wt  189 lb (85.7 kg)   BMI 27.12 kg/m   Wt Readings from Last 3 Encounters:  01/11/18 189 lb (85.7 kg)  12/11/17 191 lb (86.6 kg)  08/27/17 198 lb (89.8 kg)    Physical Exam  Constitutional: He is oriented to person, place, and time. He appears well-developed and well-nourished. No distress.  Well-appearing, comfortable, cooperative  HENT:  Head: Normocephalic and atraumatic.  Mouth/Throat: Oropharynx is clear and moist.  Eyes: Conjunctivae are normal. Right eye exhibits no discharge. Left eye exhibits no discharge.  Cardiovascular: Normal rate.  Pulmonary/Chest: Effort normal.  Musculoskeletal: Normal range of motion. He  exhibits no edema.  Left Foot Inspection: similar appearance, but left foot has slightly higher arch mid foot MTP Palpation: mild tender bony prominence mid left foot dorsal ROM: full Strength: distal intact Neurovascular: intact  Neurological: He is alert and oriented to person, place, and time.  Skin: Skin is warm and dry. No rash noted. He is not diaphoretic. No erythema.  Psychiatric: He has a normal mood and affect. His behavior is normal.  Well groomed, good eye contact, normal speech and thoughts  Nursing note and vitals reviewed.  Results for orders placed or performed during the hospital encounter of 01/09/18  Basic metabolic panel  Result Value Ref Range   Sodium 141 135 - 145 mmol/L   Potassium 4.2 3.5 - 5.1 mmol/L   Chloride 110 98 - 111 mmol/L   CO2 24 22 - 32 mmol/L   Glucose, Bld 106 (H) 70 - 99 mg/dL   BUN 28 (H) 8 - 23 mg/dL   Creatinine, Ser 1.61 0.61 - 1.24 mg/dL   Calcium 8.9 8.9 - 09.6 mg/dL   GFR calc non Af Amer >60 >60 mL/min   GFR calc Af Amer >60 >60 mL/min   Anion gap 7 5 - 15  CBC  Result Value Ref Range   WBC 8.2 3.8 - 10.6 K/uL   RBC 4.47 4.40 - 5.90 MIL/uL   Hemoglobin 13.8 13.0 - 18.0 g/dL   HCT 04.5 (L) 40.9 - 81.1 %   MCV 88.6 80.0 - 100.0 fL   MCH 30.9 26.0 - 34.0 pg   MCHC 34.8 32.0 - 36.0 g/dL   RDW 91.4 78.2 - 95.6 %   Platelets 288 150 - 440 K/uL      Assessment & Plan:   Problem List Items Addressed This Visit    BPH (benign prostatic hyperplasia) - Primary    Followed now by [facility] Dr Anola Gurney By report patient states was dx with urinary retention due to prostate, had PVR >300 and had foley cath for 10 days now removed, he did better, and has scheduled for laser treatment of prostate next week  - Requested record today - Remains on Flomax 0.4mg  BID - Had PSA and labs done, do not have all results. I have pre op labs from hospital, showed improve Cr - Follow-up as planned with Urology      Elevated lipids     Followed by Cardiology Agree with intermittent low dose statin, Rosuvastatin 5mg  every other day, tolerating well Yearly lipids in 06/2018      Elevated serum creatinine    Recent lab significantly improved to 0.8 Prior b/l 1 to 1.3 Suspect related to foley catheter now emptying bladder, may have had some post renal kidney injury, also confounding with NSAID use before  Plan Proceed with Urology management at this time for prostate to treat likely obstructive source Future monitor Creatinine  as advised. 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, primarily hip and other joints affecting his chronic daily symptoms  Plan Continue variety of medication management - Refilled Meloxicam 15mg  daily PRN - use intermittent 1-2 weeks max, or less intermittently, avoid excess NSAID for kidney health reviewed - Continue Tylenol, may continue CBD if effective, reviewed precautions - Continue Turmeric - Future variety of option such as further imaging, x-ray, PT, vs Ortho - also other meds gabapentin / tramadol if needed      Relevant Medications   meloxicam (MOBIC) 15 MG tablet   Seasonal allergies    Stable currently Re order Xyzal since beneficial Continue Flonase, Singulair - may adjust in future if not needed      Relevant Medications   levocetirizine (XYZAL) 5 MG tablet      Meds ordered this encounter  Medications  . meloxicam (MOBIC) 15 MG tablet    Sig: Take 1 tablet (15 mg total) by mouth daily as needed for pain. Up to 1-2 weeks max then reduce    Dispense:  30 tablet    Refill:  5  . levocetirizine (XYZAL) 5 MG tablet    Sig: Take 1 tablet (5 mg total) by mouth every evening.    Dispense:  30 tablet    Refill:  5    Follow up plan: Return in about 6 months (around 07/12/2018) for Annual Physical.  Future  labs ordered for 06/2018  Saralyn PilarAlexander Aaliayah Miao, DO [facility] 01/11/2018, 4:55 PM

## 2018-01-11 NOTE — Patient Instructions (Addendum)
Thank you for coming to the office today.  Refilled Meloxicam, as discussed take as needed, try to wean down on this but still use it when you need it.  Refilled Xyzal - may try to do different combinatoin of allergy pills to see what works best  Ask Dr Yves Dill about lab results and kidney function, it seems improved after catheter  Return for Flu shot if need anytime, or get at work and send [place] copy of document or date given   Results for orders placed or performed during the hospital encounter of 01/09/18 (from the past 67 hour(s))  Basic metabolic panel     Status: Abnormal   Collection Time: 01/09/18  9:11 AM  Result Value Ref Range   Sodium 141 135 - 145 mmol/L   Potassium 4.2 3.5 - 5.1 mmol/L   Chloride 110 98 - 111 mmol/L   CO2 24 22 - 32 mmol/L   Glucose, Bld 106 (H) 70 - 99 mg/dL   BUN 28 (H) 8 - 23 mg/dL   Creatinine, Ser 0.80 0.61 - 1.24 mg/dL   Calcium 8.9 8.9 - 10.3 mg/dL   GFR calc non Af Amer >60 >60 mL/min   GFR calc Af Amer >60 >60 mL/min    Comment: (NOTE) The eGFR has been calculated using the CKD EPI equation. This calculation has not been validated in all clinical situations. eGFR's persistently <60 mL/min signify possible Chronic Kidney Disease.    Anion gap 7 5 - 15    Comment: Performed at [facility], [address] [place], [place] [address]  CBC     Status: Abnormal   Collection Time: 01/09/18  9:11 AM  Result Value Ref Range   WBC 8.2 3.8 - 10.6 K/uL   RBC 4.47 4.40 - 5.90 MIL/uL   Hemoglobin 13.8 13.0 - 18.0 g/dL   HCT 39.7 (L) 40.0 - 52.0 %   MCV 88.6 80.0 - 100.0 fL   MCH 30.9 26.0 - 34.0 pg   MCHC 34.8 32.0 - 36.0 g/dL   RDW 14.3 11.5 - 14.5 %   Platelets 288 150 - 440 K/uL    Comment: Performed at [facility], [address] [place], [place] [address]    DUE for FASTING BLOOD WORK (no food or drink after midnight before the lab appointment, only water or coffee without cream/sugar on the morning of)  SCHEDULE  "Lab Only" visit in the morning at the clinic for lab draw in 6 MONTHS   - Make sure Lab Only appointment is at about 1 week before your next appointment, so that results will be available  For Lab Results, once available within 2-3 days of blood draw, you can can log in to MyChart online to view your results and a brief explanation. Also, we can discuss results at next follow-up visit.   Please schedule a Follow-up Appointment to: Return in about 6 months (around 07/12/2018) for Annual Physical.  If you have any other questions or concerns, please feel free to call the office or send a message through [place]. You may also schedule an earlier appointment if necessary.  Additionally, you may be receiving a survey about your experience at our office within a few days to 1 week by e-mail or mail. We value your feedback.  Nobie Putnam, DO [place]

## 2018-01-11 NOTE — Assessment & Plan Note (Signed)
Followed by Cardiology Agree with intermittent low dose statin, Rosuvastatin 5mg  every other day, tolerating well Yearly lipids in 06/2018

## 2018-01-11 NOTE — Assessment & Plan Note (Signed)
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

## 2018-01-11 NOTE — Assessment & Plan Note (Signed)
Stable currently Re order Xyzal since beneficial Continue Flonase, Singulair - may adjust in future if not needed

## 2018-01-11 NOTE — Assessment & Plan Note (Signed)
Followed now by [facility] Dr Anola GurneyMichael Wolff By report patient states was dx with urinary retention due to prostate, had PVR >300 and had foley cath for 10 days now removed, he did better, and has scheduled for laser treatment of prostate next week  - Requested record today - Remains on Flomax 0.4mg  BID - Had PSA and labs done, do not have all results. I have pre op labs from hospital, showed improve Cr - Follow-up as planned with Urology

## 2018-01-11 NOTE — Assessment & Plan Note (Signed)
Recent lab significantly improved to 0.8 Prior b/l 1 to 1.3 Suspect related to foley catheter now emptying bladder, may have had some post renal kidney injury, also confounding with NSAID use before  Plan Proceed with Urology management at this time for prostate to treat likely obstructive source Future monitor Creatinine as advised. He will defer to Dr Evelene CroonWolff for now Limit NSAIDs in future, as discussed will rx meloxicam but use intermittent or sparingly

## 2018-01-14 ENCOUNTER — Encounter: Payer: Self-pay | Admitting: *Deleted

## 2018-01-14 NOTE — Pre-Procedure Instructions (Signed)
Austin Iba, MD  Physician  Cardiology  Progress Notes  Signed  Encounter Date:  06/19/2017          Signed      Expand All Collapse All    Show:Clear all [x] Manual[x] Template[x] Copied  Added by: [x] Austin Iba, MD  [] Hover for details Cardiology Office Note  Date:  06/19/2017   ID:  Austin Stewart, DOB 05-22-1955, MRN [id number]  PCP:  Smitty Cords, DO                 Chief Complaint  Patient presents with  . other    30mo f/u. Pt states is he doing well. Reviewed meds with pt verbally.    HPI:  [name] is a 62 year old gentleman,  history of  significant mitral valve regurgitation (torn leaflet? by his report) with associated shortness of breath,  mitral valve repair in 2004/09/14 at [facility] with maze procedure at that time,  shoulder surgery March 2013 canceled secondary to arrhythmia, likely PACs with followup echocardiogram and stress test showing no ischemia by report, s houlder surgery April 2013  He is taking flecainide presumably for ectopy. This was started by prior cardiologist. who presents for routine followup of his arrhythmia.  In follow-up today he reports that he is feeling well Recent bilateral shoulder surgery, toe surgery Has not been exercising, relatively inactive Weight up at least 10 pounds over the past year Getting ready to restart exercise program once he is cleared by surgery Denies any tachycardia He continues on flecainide 50 mill grams twice a day with metoprolol 12.5 mg   Previous lab work reviewed with him HBA1C 5.2 Lipids pending  EKG personally reviewed by myself on todays visit Shows normal sinus rhythm rate 65 bpm no significant ST or T wave changes  CT scan March 2017 Coronary calcium score of 130. This was 78 percentile for age and sex matched control.  Previously started on Lipitor 10 mg daily, reported having hives, stopped the medication Cholesterol dropped from 168 down to  93 on Lipitor in 09/15/2015 Event monitor reviewed with him showing rare episodes of SVT ranging from 4 beats up to 19 beats, 12 episodes in total over 2 week.  Total 138, LDL 71 HBa1C 5.2  Other past medical history reviewed  Father passed away earlier in Sep 15, 2015, lived in [place]  echocardiogram in 2011/09/15 which showed well functioning mitral valve.  Plan Holter monitor in 09/15/11. He does not know what that showed.   cholesterol has not been a problem for the patient. No significant family history of coronary artery disease. Nonsmoker. no coronary artery disease seen on previous cardiac catheterization in 09-14-2004 He was started on flecainide 50 mg twice a day for ectopy in Sep 15, 2011.  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  PSH:         Past Surgical History:  Procedure Laterality Date  . CARDIAC CATHETERIZATION  14-Sep-2004   [facility]   . FOOT SURGERY  03/2017   right great toe Dr.Hyatt   . HERNIA REPAIR    . MITRAL VALVE REPAIR    . rotator cuff repair    .  SHOULDER ARTHROSCOPY Left 08/24/2016   Procedure: ARTHROSCOPY SHOULDER with debridement;  Surgeon: Juanell Fairly, MD;  Location: [facility] ORS;  Service: Orthopedics;  Laterality: Left;  . TONSILLECTOMY            Current Outpatient Medications  Medication Sig Dispense Refill  . aspirin 81 MG tablet Take 81 mg by mouth daily.    . fexofenadine-pseudoephedrine (ALLEGRA-D) 60-120 MG 12 hr tablet Take 1 tablet 2 (two) times daily by mouth.  20 tablet 0  . flecainide (TAMBOCOR) 50 MG tablet Take 1 tablet (50 mg total) by mouth 2 (two) times daily. 180 tablet 3  . fluticasone (FLONASE) 50 MCG/ACT nasal spray Place 2 sprays into both nostrils daily. Dispense 3 bottles. 48 g 3  . metoprolol succinate (TOPROL-XL) 25 MG 24 hr tablet Take 1 tablet (25 mg total) by mouth daily. 90 tablet 3  . montelukast (SINGULAIR) 10 MG tablet Take 1 tablet (10 mg total) by mouth at bedtime. 90 tablet 3  . Multiple Vitamins-Minerals (MULTIVITAMIN WITH MINERALS) tablet Take 1 tablet by mouth daily.    . naphazoline-pheniramine ([employer] EYE ALLERGY RELIEF) 0.025-0.3 % ophthalmic solution Place 1 drop into both eyes 4 (four) times daily as needed for irritation.    Marland Kitchen omeprazole (PRILOSEC) 20 MG capsule TAKE 1 CAPSULE BY MOUTH DAILY. 90 capsule 3  . sildenafil (REVATIO) 20 MG tablet Take 1-5 pills about 30 min prior to sex. Start with 1 and increase as needed. 20 tablet 1  . sodium chloride (OCEAN) 0.65 % SOLN nasal spray Place 1 spray into both nostrils as needed for congestion. 15 mL 0  . tamsulosin (FLOMAX) 0.4 MG CAPS capsule Take 1 capsule (0.4 mg total) by mouth 2 (two) times daily. 180 capsule 3  . Turmeric 500 MG CAPS Take 2 capsules by mouth daily.    Marland Kitchen Zoster Vac Recomb Adjuvanted [facility]) injection Inject 0.5 mLs into the muscle every 6 (six) months. For series of 2 injections total 0.5 mL 1   No current facility-administered medications for this visit.      Allergies:   Other   Social History:  The patient  reports that  has never smoked. he has never used smokeless tobacco. He reports that he does not drink alcohol or use drugs.   Family History:   family history includes Cancer in his maternal uncle; Dementia in his father; Emphysema in his father; Heart disease in his mother; Hypertension in his mother.    Review of Systems: Review of Systems  Constitutional: Negative.   Respiratory: Negative.   Cardiovascular: Negative.     Gastrointestinal: Negative.   Musculoskeletal: Positive for joint pain.  Neurological: Negative.   Psychiatric/Behavioral: Negative.   All other systems reviewed and are negative.    PHYSICAL EXAM: VS:  BP 114/82 (BP Location: Left Arm, Patient Position: Sitting, Cuff Size: Normal)   Pulse 65   Ht 5\' 10"  (1.778 m)   Wt 201 lb (91.2 kg)   BMI 28.84 kg/m  , BMI Body mass index is 28.84 kg/m. Constitutional:  oriented to person, place, and time. No distress.  HENT:  Head: Normocephalic and atraumatic.  Eyes:  no discharge. No scleral icterus.  Neck: Normal range of motion. Neck supple. No JVD present.  Cardiovascular: Normal rate, regular rhythm, normal heart sounds and intact distal pulses. Exam reveals no gallop and no friction rub. No edema No murmur heard. Pulmonary/Chest: Effort normal and breath sounds normal. No stridor. No respiratory distress.  no wheezes.  no  rales.  no tenderness.  Abdominal: Soft.  no distension.  no tenderness.  Musculoskeletal: Normal range of motion.  no  tenderness or deformity.  Neurological:  normal muscle tone. Coordination normal. No atrophy Skin: Skin is warm and dry. No rash noted. not diaphoretic.  Psychiatric:  normal mood and affect. behavior is normal. Thought content normal.    Recent Labs: 08/21/2016: BUN 15; Creatinine, Ser 1.02; Hemoglobin 15.0; Platelets 372; Potassium 3.7; Sodium 139    Lipid Panel RecentLabs       Lab Results  Component Value Date   CHOL 138 06/16/2016   HDL 48 06/16/2016   LDLCALC 71 06/16/2016   TRIG 97 06/16/2016           Wt Readings from Last 3 Encounters:  06/19/17 201 lb (91.2 kg)  03/30/17 190 lb 3.2 oz (86.3 kg)  03/19/17 192 lb 12.8 oz (87.5 kg)      ASSESSMENT AND PLAN:  Diastolic congestive heart failure, unspecified congestive heart failure chronicity (HCC) - Plan: EKG 12-Lead Appears euvolemic, no changes to his medications  H/O mitral valve repair - Plan: EKG  12-Lead No significant murmur on exam We have offered echocardiogram as on previous visits As he has no new symptoms, he is inclined not to do echocardiogram  SVT/Heart palpitations - Plan: EKG 12-Lead Taking  flecainide and low-dose metoprolol  Hyperlipidemia Lab work pending,  Will likely be elevated given 10 pound weight gain  Prediabetes  10 pound weight gain, recommend he restart his exercise program   Total encounter time more than 25 minutes  Greater than 50% was spent in counseling and coordination of care with the patient   Disposition:   F/U  12 months      Orders Placed This Encounter  Procedures  . EKG 12-Lead     Signed, Dossie Arbourim Gollan, M.D., Ph.D. 06/19/2017  [facility] [place] [phone]         Electronically signed by Austin IbaGollan, Timothy J, MD at 06/19/2017 5:58 PM     Office Visit on 06/19/2017       Detailed Report

## 2018-01-15 ENCOUNTER — Other Ambulatory Visit: Payer: Self-pay

## 2018-01-15 ENCOUNTER — Ambulatory Visit: Payer: 59 | Admitting: Anesthesiology

## 2018-01-15 ENCOUNTER — Encounter
Admission: RE | Disposition: A | Discharge status: Home / Self Care | Payer: Self-pay | Source: Ambulatory Visit | Attending: Urology

## 2018-01-15 ENCOUNTER — Encounter: Payer: Self-pay | Admitting: *Deleted

## 2018-01-15 ENCOUNTER — Ambulatory Visit
Admission: RE | Admit: 2018-01-15 | Discharge: 2018-01-15 | Disposition: A | Discharge status: Home / Self Care | Payer: 59 | Source: Ambulatory Visit | Attending: Urology | Admitting: Urology

## 2018-01-15 DIAGNOSIS — Z79899 Other long term (current) drug therapy: Secondary | ICD-10-CM | POA: Diagnosis not present

## 2018-01-15 DIAGNOSIS — I5030 Unspecified diastolic (congestive) heart failure: Secondary | ICD-10-CM | POA: Diagnosis not present

## 2018-01-15 DIAGNOSIS — J309 Allergic rhinitis, unspecified: Secondary | ICD-10-CM | POA: Diagnosis not present

## 2018-01-15 DIAGNOSIS — N138 Other obstructive and reflux uropathy: Secondary | ICD-10-CM | POA: Insufficient documentation

## 2018-01-15 DIAGNOSIS — R338 Other retention of urine: Secondary | ICD-10-CM | POA: Diagnosis not present

## 2018-01-15 DIAGNOSIS — N400 Enlarged prostate without lower urinary tract symptoms: Secondary | ICD-10-CM | POA: Diagnosis not present

## 2018-01-15 DIAGNOSIS — E7800 Pure hypercholesterolemia, unspecified: Secondary | ICD-10-CM | POA: Diagnosis not present

## 2018-01-15 DIAGNOSIS — N320 Bladder-neck obstruction: Secondary | ICD-10-CM | POA: Diagnosis not present

## 2018-01-15 DIAGNOSIS — K219 Gastro-esophageal reflux disease without esophagitis: Secondary | ICD-10-CM | POA: Insufficient documentation

## 2018-01-15 DIAGNOSIS — N401 Enlarged prostate with lower urinary tract symptoms: Secondary | ICD-10-CM | POA: Diagnosis not present

## 2018-01-15 HISTORY — PX: GREEN LIGHT LASER TURP (TRANSURETHRAL RESECTION OF PROSTATE: SHX6260

## 2018-01-15 SURGERY — GREEN LIGHT LASER TURP (TRANSURETHRAL RESECTION OF PROSTATE
Anesthesia: General | Site: Prostate

## 2018-01-15 MED ORDER — LIDOCAINE HCL URETHRAL/MUCOSAL 2 % EX GEL
CUTANEOUS | Status: DC | PRN
  Administered 2018-01-15: 1 via URETHRAL

## 2018-01-15 MED ORDER — FENTANYL CITRATE (PF) 100 MCG/2ML IJ SOLN
INTRAMUSCULAR | Status: AC
  Filled 2018-01-15: qty 2

## 2018-01-15 MED ORDER — MIDAZOLAM HCL 2 MG/2ML IJ SOLN
INTRAMUSCULAR | Status: DC | PRN
  Administered 2018-01-15: 2 mg via INTRAVENOUS

## 2018-01-15 MED ORDER — EPHEDRINE SULFATE 50 MG/ML IJ SOLN
INTRAMUSCULAR | Status: DC | PRN
  Administered 2018-01-15: 5 mg via INTRAVENOUS
  Administered 2018-01-15 (×2): 10 mg via INTRAVENOUS

## 2018-01-15 MED ORDER — LACTATED RINGERS IV SOLN
INTRAVENOUS | Status: DC
  Administered 2018-01-15: 14:00:00 via INTRAVENOUS

## 2018-01-15 MED ORDER — HYOSCYAMINE SULFATE SL 0.125 MG SL SUBL: 0.1250 mg | SUBLINGUAL_TABLET | SUBLINGUAL | 2 refills | Status: DC | PRN

## 2018-01-15 MED ORDER — LIDOCAINE HCL (CARDIAC) PF 100 MG/5ML IV SOSY
PREFILLED_SYRINGE | INTRAVENOUS | Status: DC | PRN
  Administered 2018-01-15: 100 mg via INTRAVENOUS

## 2018-01-15 MED ORDER — CEFAZOLIN SODIUM-DEXTROSE 1-4 GM/50ML-% IV SOLN
1.0000 g | Freq: Once | INTRAVENOUS | Status: AC
  Administered 2018-01-15: 2 g via INTRAVENOUS

## 2018-01-15 MED ORDER — BELLADONNA ALKALOIDS-OPIUM 16.2-60 MG RE SUPP
RECTAL | Status: DC | PRN
  Administered 2018-01-15: 1 via RECTAL

## 2018-01-15 MED ORDER — PHENYLEPHRINE HCL 10 MG/ML IJ SOLN
INTRAMUSCULAR | Status: DC | PRN
  Administered 2018-01-15 (×2): 100 ug via INTRAVENOUS

## 2018-01-15 MED ORDER — SUGAMMADEX SODIUM 500 MG/5ML IV SOLN
INTRAVENOUS | Status: AC
  Filled 2018-01-15: qty 5

## 2018-01-15 MED ORDER — ACETAMINOPHEN-CODEINE #3 300-30 MG PO TABS: 1.0000 | ORAL_TABLET | ORAL | 1 refills | Status: DC | PRN

## 2018-01-15 MED ORDER — ONDANSETRON HCL 4 MG/2ML IJ SOLN
INTRAMUSCULAR | Status: DC | PRN
  Administered 2018-01-15: 4 mg via INTRAVENOUS

## 2018-01-15 MED ORDER — DOCUSATE SODIUM 100 MG PO CAPS: 200.0000 mg | ORAL_CAPSULE | Freq: Two times a day (BID) | ORAL | 3 refills | Status: DC

## 2018-01-15 MED ORDER — ONDANSETRON HCL 4 MG/2ML IJ SOLN: 4.0000 mg | Freq: Once | INTRAMUSCULAR | Status: DC | PRN

## 2018-01-15 MED ORDER — FENTANYL CITRATE (PF) 100 MCG/2ML IJ SOLN
INTRAMUSCULAR | Status: DC | PRN
  Administered 2018-01-15: 50 ug via INTRAVENOUS
  Administered 2018-01-15: 100 ug via INTRAVENOUS

## 2018-01-15 MED ORDER — FENTANYL CITRATE (PF) 100 MCG/2ML IJ SOLN
25.0000 ug | INTRAMUSCULAR | Status: DC | PRN
  Administered 2018-01-15: 25 ug via INTRAVENOUS

## 2018-01-15 MED ORDER — ROCURONIUM BROMIDE 50 MG/5ML IV SOLN
INTRAVENOUS | Status: AC
  Filled 2018-01-15: qty 1

## 2018-01-15 MED ORDER — ROCURONIUM BROMIDE 100 MG/10ML IV SOLN
INTRAVENOUS | Status: DC | PRN
  Administered 2018-01-15: 20 mg via INTRAVENOUS
  Administered 2018-01-15: 40 mg via INTRAVENOUS

## 2018-01-15 MED ORDER — PROPOFOL 10 MG/ML IV BOLUS
INTRAVENOUS | Status: AC
  Filled 2018-01-15: qty 20

## 2018-01-15 MED ORDER — CEFAZOLIN SODIUM-DEXTROSE 1-4 GM/50ML-% IV SOLN
INTRAVENOUS | Status: AC
  Filled 2018-01-15: qty 50

## 2018-01-15 MED ORDER — LIDOCAINE HCL (PF) 2 % IJ SOLN
INTRAMUSCULAR | Status: AC
  Filled 2018-01-15: qty 10

## 2018-01-15 MED ORDER — LEVOFLOXACIN 500 MG PO TABS: 500.0000 mg | ORAL_TABLET | Freq: Every day | ORAL | 1 refills | Status: DC

## 2018-01-15 MED ORDER — SUGAMMADEX SODIUM 500 MG/5ML IV SOLN
INTRAVENOUS | Status: DC | PRN
  Administered 2018-01-15: 350 mg via INTRAVENOUS

## 2018-01-15 MED ORDER — BELLADONNA ALKALOIDS-OPIUM 16.2-60 MG RE SUPP
RECTAL | Status: AC
  Filled 2018-01-15: qty 1

## 2018-01-15 MED ORDER — LACTATED RINGERS IV BOLUS
1000.0000 mL | Freq: Once | INTRAVENOUS | Status: AC
  Administered 2018-01-15: 1000 mL via INTRAVENOUS

## 2018-01-15 MED ORDER — PROPOFOL 10 MG/ML IV BOLUS
INTRAVENOUS | Status: DC | PRN
  Administered 2018-01-15: 150 mg via INTRAVENOUS

## 2018-01-15 MED ORDER — URIBEL 118 MG PO CAPS: 1.0000 | ORAL_CAPSULE | Freq: Four times a day (QID) | ORAL | 3 refills | Status: DC | PRN

## 2018-01-15 MED ORDER — MIDAZOLAM HCL 2 MG/2ML IJ SOLN
INTRAMUSCULAR | Status: AC
  Filled 2018-01-15: qty 2

## 2018-01-15 MED ORDER — DEXAMETHASONE SODIUM PHOSPHATE 10 MG/ML IJ SOLN
INTRAMUSCULAR | Status: DC | PRN
  Administered 2018-01-15: 10 mg via INTRAVENOUS

## 2018-01-15 SURGICAL SUPPLY — 27 items
ADAPTER IRRIG TUBE 2 SPIKE SOL (ADAPTER) ×4 IMPLANT
BAG URINE DRAINAGE (UROLOGICAL SUPPLIES) ×2 IMPLANT
CATH FOLEY 2WAY  5CC 20FR SIL (CATHETERS) ×1
CATH FOLEY 2WAY 5CC 20FR SIL (CATHETERS) ×1 IMPLANT
GLOVE BIO SURGEON STRL SZ7 (GLOVE) ×4 IMPLANT
GLOVE BIO SURGEON STRL SZ7.5 (GLOVE) ×2 IMPLANT
GOWN STRL REUS W/ TWL LRG LVL3 (GOWN DISPOSABLE) ×1 IMPLANT
GOWN STRL REUS W/ TWL XL LVL3 (GOWN DISPOSABLE) ×1 IMPLANT
GOWN STRL REUS W/TWL LRG LVL3 (GOWN DISPOSABLE) ×1
GOWN STRL REUS W/TWL XL LVL3 (GOWN DISPOSABLE) ×1
IV NS 1000ML (IV SOLUTION) ×1
IV NS 1000ML BAXH (IV SOLUTION) ×1 IMPLANT
IV SET PRIMARY 15D 139IN B9900 (IV SETS) ×2 IMPLANT
KIT TURNOVER CYSTO (KITS) ×2 IMPLANT
LASER GREENLIGHT XPS PROCEDURE (MISCELLANEOUS) ×1 IMPLANT
LASER GRNLGT MOXY FIBER 750UM (MISCELLANEOUS) ×1 IMPLANT
LASER XPS ACCESS DROP OFF FEE (MISCELLANEOUS) ×1 IMPLANT
PACK CYSTO AR (MISCELLANEOUS) ×2 IMPLANT
SET IRRIG Y TYPE TUR BLADDER L (SET/KITS/TRAYS/PACK) ×2 IMPLANT
SOL .9 NS 3000ML IRR  AL (IV SOLUTION) ×4
SOL .9 NS 3000ML IRR UROMATIC (IV SOLUTION) ×4 IMPLANT
SOL PREP PVP 2OZ (MISCELLANEOUS) ×2
SOLUTION PREP PVP 2OZ (MISCELLANEOUS) ×1 IMPLANT
SURGILUBE 2OZ TUBE FLIPTOP (MISCELLANEOUS) ×2 IMPLANT
SYRINGE IRR TOOMEY STRL 70CC (SYRINGE) ×2 IMPLANT
TUBING CONNECTING 10 (TUBING) ×2 IMPLANT
WATER STERILE IRR 1000ML POUR (IV SOLUTION) ×2 IMPLANT

## 2018-01-15 NOTE — Progress Notes (Signed)
Meatal bleeding earlier but less now

## 2018-01-15 NOTE — Anesthesia Preprocedure Evaluation (Addendum)
Anesthesia Evaluation  Patient identified by MRN, date of birth, ID band Patient awake    Reviewed: Allergy & Precautions, NPO status , Patient's Chart, lab work & pertinent test results  Airway Mallampati: II       Dental   Pulmonary neg sleep apnea, neg COPD,           Cardiovascular (-) hypertension(-) Past MI and (-) CHF + dysrhythmias (pt with afib s/p MVR converted medically, SR since) Atrial Fibrillation + Valvular Problems/Murmurs (MV Repair)      Neuro/Psych neg Seizures    GI/Hepatic Neg liver ROS, GERD  Medicated and Controlled,  Endo/Other  neg diabetes  Renal/GU negative Renal ROS     Musculoskeletal   Abdominal   Peds  Hematology   Anesthesia Other Findings   Reproductive/Obstetrics                            Anesthesia Physical Anesthesia Plan  ASA: III  Anesthesia Plan: General   Post-op Pain Management:    Induction: Intravenous  PONV Risk Score and Plan: 2 and Dexamethasone and Ondansetron  Airway Management Planned: Oral ETT  Additional Equipment:   Intra-op Plan:   Post-operative Plan:   Informed Consent: I have reviewed the patients History and Physical, chart, labs and discussed the procedure including the risks, benefits and alternatives for the proposed anesthesia with the patient or authorized representative who has indicated his/her understanding and acceptance.     Plan Discussed with:   Anesthesia Plan Comments:         Anesthesia Quick Evaluation

## 2018-01-15 NOTE — Discharge Instructions (Addendum)
[facility] Laser Prostate Treatment Green light laser therapy is a procedure that uses a high-energy laser to get rid of extra prostate tissue by turning the tissue into a vapor. It is less invasive than traditional methods of prostate surgery, which involve cutting out the prostate tissue. Because the tissue is turned into a vapor (vaporized) rather than cut out, there is generally less blood loss. This surgery is used to treat an enlarged prostate gland (benign prostatic hyperplasia). Tell a health care provider about:  Any allergies you have.  All medicines you are taking, including vitamins, herbs, eye drops, creams, and over-the-counter medicines.  Any problems you or family members have had with anesthetic medicines.  Any blood disorders you have.  Any surgeries you have had.  Any medical conditions you have. What are the risks? Generally, this is a safe procedure. However, problems may occur, including:  Infection.  Bleeding.  Allergic reaction to medicines.  Damage to other structures or organs.  Blood in the urine (hematuria).  Painful urination.  Urinary tract infection.  Erectile dysfunction (rare).  Dry ejaculation.  Scar tissue in the urinary passage.  What happens before the procedure? Staying hydrated Follow instructions from your health care provider about hydration, which may include:  Up to 2 hours before the procedure - you may continue to drink clear liquids, such as water, clear fruit juice, black coffee, and plain tea.  Eating and drinking restrictions Follow instructions from your health care provider about eating and drinking, which may include:  8 hours before the procedure - stop eating heavy meals or foods such as meat, fried foods, or fatty foods.  6 hours before the procedure - stop eating light meals or foods, such as toast or cereal.  6 hours before the procedure - stop drinking milk or drinks that contain milk.  2 hours before the  procedure - stop drinking clear liquids.  Medicines  Ask your health care provider about: ? Changing or stopping your regular medicines. This is especially important if you are taking diabetes medicines or blood thinners. ? Taking medicines such as aspirin and ibuprofen. These medicines can thin your blood. Do not take these medicines before your procedure if your health care provider instructs you not to.  You may be prescribed antibiotic medicine. If so, take your antibiotic as told by your health care provider. Do not stop taking the antibiotic even if you start to feel better. General instructions  Plan to have someone take you home from the hospital or clinic.  If you will be going home right after the procedure, plan to have someone with you for 24 hours. What happens during the procedure?  To reduce your risk of infection: ? Your health care team will wash or sanitize their hands. ? Your skin will be washed with soap.  You will be given one or more of the following: ? A medicine to help you relax (sedative). ? A medicine to make you fall asleep (general anesthetic). ? A medicine that is injected into your spine to numb the area below and slightly above the injection site (spinal anesthetic).  A tube containing viewing scopes and instruments (fiber-optic scope) will be inserted through your penis.  A thin fiber will be put through the tube and positioned next to the extra prostate tissue.  Pulses of laser light will come from the end of the fiber and be projected onto the extra tissue. Your blood will absorb the light, become hot, and vaporize the  extra prostate tissue.  The heat from the laser beam will seal off the blood vessels, which will lessen bleeding.  The fiber-optic scope will be removed and replaced with a temporary tube (catheter) that is used to help urine flow. The procedure may vary among health care providers and hospitals. What happens after the  procedure?  Your blood pressure, heart rate, breathing rate, and blood oxygen level will be monitored until the medicines you were given have worn off.  Depending on factors such as the amount of prostate tissue that was vaporized, the strength of your bladder, and the amount of bleeding expected, your catheter may be removed.  You may be given elastic support stockings to wear to help prevent blood clots in your legs.  Do not drive for 24 hours if you were given a sedative, or for as long as directed by your health care provider. Summary  Green light laser therapy is a procedure that uses a high-energy laser that turns extra prostate tissue into a vapor.  This procedure is less invasive than traditional methods of prostate surgery.  Follow instructions from your health care provider about eating and drinking before the procedure.  Pulses of laser light will come from the end of a thin fiber and be aimed at the extra prostate tissue. Your blood will absorb the light, become hot, and vaporize the extra tissue. This information is not intended to replace advice given to you by your health care provider. Make sure you discuss any questions you have with your health care provider. Document Released: 07/25/2007 Document Revised: 05/06/2016 Document Reviewed: 05/06/2016 Elsevier Interactive Patient Education  2017 [employer]   Benign Prostatic Hyperplasia Benign prostatic hyperplasia (BPH) is an enlarged prostate gland that is caused by the normal aging process and not by cancer. The prostate is a walnut-sized gland that is involved in the production of semen. It is located in front of the rectum and below the bladder. The bladder stores urine and the urethra is the tube that carries the urine out of the body. The prostate may get bigger as a man gets older. An enlarged prostate can press on the urethra. This can make it harder to pass urine. The build-up of urine in the bladder can cause  infection. Back pressure and infection may progress to bladder damage and kidney (renal) failure. What are the causes? This condition is part of a normal aging process. However, not all men develop problems from this condition. If the prostate enlarges away from the urethra, urine flow will not be blocked. If it enlarges toward the urethra and compresses it, there will be problems passing urine. What increases the risk? This condition is more likely to develop in men over the age of 5 years. What are the signs or symptoms? Symptoms of this condition include:  Getting up often during the night to urinate.  Needing to urinate frequently during the day.  Difficulty starting urine flow.  Decrease in size and strength of your urine stream.  Leaking (dribbling) after urinating.  Inability to pass urine. This needs immediate treatment.  Inability to completely empty your bladder.  Pain when you pass urine. This is more common if there is also an infection.  Urinary tract infection (UTI).  How is this diagnosed? This condition is diagnosed based on your medical history, a physical exam, and your symptoms. Tests will also be done, such as:  A post-void bladder scan. This measures any amount of urine that may remain in  your bladder after you finish urinating.  A digital rectal exam. In a rectal exam, your health care provider checks your prostate by putting a lubricated, gloved finger into your rectum to feel the back of your prostate gland. This exam detects the size of your gland and any abnormal lumps or growths.  An exam of your urine (urinalysis).  A prostate specific antigen (PSA) screening. This is a blood test used to screen for prostate cancer.  An ultrasound. This test uses sound waves to electronically produce a picture of your prostate gland.  Your health care provider may refer you to a specialist in kidney and prostate diseases (urologist). How is this treated? Once  symptoms begin, your health care provider will monitor your condition (active surveillance or watchful waiting). Treatment for this condition will depend on the severity of your condition. Treatment may include:  Observation and yearly exams. This may be the only treatment needed if your condition and symptoms are mild.  Medicines to relieve your symptoms, including: ? Medicines to shrink the prostate. ? Medicines to relax the muscle of the prostate.  Surgery in severe cases. Surgery may include: ? Prostatectomy. In this procedure, the prostate tissue is removed completely through an open incision or with a laparascope or robotics. ? Transurethral resection of the prostate (TURP). In this procedure, a tool is inserted through the opening at the tip of the penis (urethra). It is used to cut away tissue of the inner core of the prostate. The pieces are removed through the same opening of the penis. This removes the blockage. ? Transurethral incision (TUIP). In this procedure, small cuts are made in the prostate. This lessens the prostate's pressure on the urethra. ? Transurethral microwave thermotherapy (TUMT). This procedure uses microwaves to create heat. The heat destroys and removes a small amount of prostate tissue. ? Transurethral needle ablation (TUNA). This procedure uses radio frequencies to destroy and remove a small amount of prostate tissue. ? Interstitial laser coagulation ([place]). This procedure uses a laser to destroy and remove a small amount of prostate tissue. ? Transurethral electrovaporization (TUVP). This procedure uses electrodes to destroy and remove a small amount of prostate tissue. ? Prostatic urethral lift. This procedure inserts an implant to push the lobes of the prostate away from the urethra.  Follow these instructions at home:  Take over-the-counter and prescription medicines only as told by your health care provider.  Monitor your symptoms for any changes. Contact  your health care provider with any changes.  Avoid drinking large amounts of liquid before going to bed or out in public.  Avoid or reduce how much caffeine or alcohol you drink.  Give yourself time when you urinate.  Keep all follow-up visits as told by your health care provider. This is important. Contact a health care provider if:  You have unexplained back pain.  Your symptoms do not get better with treatment.  You develop side effects from the medicine you are taking.  Your urine becomes very dark or has a bad smell.  Your lower abdomen becomes distended and you have trouble passing your urine. Get help right away if:  You have a fever or chills.  You suddenly cannot urinate.  You feel lightheaded, or very dizzy, or you faint.  There are large amounts of blood or clots in the urine.  Your urinary problems become hard to manage.  You develop moderate to severe low back or flank pain. The flank is the side of your body  between the ribs and the hip. These symptoms may represent a serious problem that is an emergency. Do not wait to see if the symptoms will go away. Get medical help right away. Call your local emergency services (911 in the [place] Do not drive yourself to the hospital. Summary  Benign prostatic hyperplasia (BPH) is an enlarged prostate that is caused by the normal aging process and not by cancer.  An enlarged prostate can press on the urethra. This can make it hard to pass urine.  This condition is part of a normal aging process and is more likely to develop in men over the age of 50 years.  Get help right away if you suddenly cannot urinate. This information is not intended to replace advice given to you by your health care provider. Make sure you discuss any questions you have with your health care provider. Document Released: 04/17/2005 Document Revised: 05/22/2016 Document Reviewed: 05/22/2016 Elsevier Interactive Patient Education  2018 [employer].  Prostate Laser Surgery, Care After This sheet gives you information about how to care for yourself after your procedure. Your health care provider may also give you more specific instructions. If you have problems or questions, contact your health care provider. What can I expect after the procedure? For the first few weeks after the procedure:  You will feel a need to urinate often.  You may have blood in your urine.  You may feel a sudden need to urinate.  Once your urinary catheter is removed, you may have a burning feeling when you urinate, especially at the end of urination. This feeling usually passes within 3-5 days. Follow these instructions at home: Activity  Return to your normal activities as told by your health care provider. Ask your health care provider what activities are safe for you.  Do not do vigorous exercise for 1 week or as told by your health care provider.  Do not lift anything that is heavier than 10 lb (4.5 kg) until your health care provider say it is safe.  Avoid sexual activity for 4-6 weeks or as told by your health care provider.  Do not ride in a car for extended periods of time for 1 month or as told by your health care provider.  Do not drive for 24 hours if you were given a medicine to help you relax (sedative). Diet  Eat foods that are high in fiber, such as fresh fruits and vegetables, whole grains, and beans.  Drink enough fluid to keep your urine clear or pale yellow. Medicines  Take over-the-counter and prescription medicines, including stool softeners, only as told by your health care provider.  If you were prescribed an antibiotic medicine, take it as told by your health care provider. Do not stop taking the antibiotic even if you start to feel better. General instructions  If you were given elastic support stockings, wear them as told by your health care provider.  Do not strain to have a bowel movement. Straining may lead to  bleeding from the prostate and cause clots to form and cause trouble urinating.  Keep all follow-up visits as told by your health care provider. This is important. Contact a health care provider if:  You have a fever or chills.  You have spasms or pain with the urinary catheter still in place.  Once the catheter has been removed, you experience difficulty starting your stream when attempting to urinate. Get help right away if:  There is a blockage in  your catheter.  Your catheter has been removed and you are suddenly unable to urinate.  Your urine smells unusually bad.  You start to have blood clots in your urine.  The blood in your urine becomes persistent or gets thick.  You develop chest pains.  You develop shortness of breath.  You develop swelling or pain in your leg. Summary  You may notice urinary symptoms for a few weeks after your procedure.  Follow instructions from your health care provider regarding activity restrictions such as lifting, exercise, and sexual activity.  Contact your health care provider if you have any unusual symptoms during your recovery. This information is not intended to replace advice given to you by your health care provider. Make sure you discuss any questions you have with your health care provider. Document Released: 04/17/2005 Document Revised: 12/03/2015 Document Reviewed: 12/03/2015 Elsevier Interactive Patient Education  2018 [employer] Inc.    AMBULATORY SURGERY  DISCHARGE INSTRUCTIONS   1) The drugs that you were given will stay in your system until tomorrow so for the next 24 hours you should not:  A) Drive an automobile B) Make any legal decisions C) Drink any alcoholic beverage   2) You may resume regular meals tomorrow.  Today it is better to start with liquids and gradually work up to solid foods.  You may eat anything you prefer, but it is better to start with liquids, then soup and crackers, and gradually work up  to solid foods.   3) Please notify your doctor immediately if you have any unusual bleeding, trouble breathing, redness and pain at the surgery site, drainage, fever, or pain not relieved by medication.    4) Additional Instructions:        Please contact your physician with any problems or Same Day Surgery at [phone], Monday through Friday 6 am to 4 pm, or [place] at [facility] Main number at [phone].

## 2018-01-15 NOTE — Transfer of Care (Signed)
Immediate Anesthesia Transfer of Care Note  Patient: Austin MarrowRobert D Digangi  Procedure(s) Performed: Procedure(s): GREEN LIGHT LASER TURP (TRANSURETHRAL RESECTION OF PROSTATE (N/A)  Patient Location: PACU  Anesthesia Type:General  Level of Consciousness: sedated  Airway & Oxygen Therapy: Patient Spontanous Breathing and Patient connected to face mask oxygen  Post-op Assessment: Report given to RN and Post -op Vital signs reviewed and stable  Post vital signs: Reviewed and stable  Last Vitals:  Vitals:   01/15/18 1321 01/15/18 1624  BP: (!) 128/91 116/70  Pulse: 71 77  Resp: 16 16  Temp: (!) 36.3 C (!) 36.2 C  SpO2: 97% 97%    Complications: No apparent anesthesia complications

## 2018-01-15 NOTE — Op Note (Signed)
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 Evelene CroonWolff MD  Anesthesia: General  Indications:See the history and physical. After informed consent the above procedure(s) were requested     Technique and findings: After adequate general anesthesia been obtained the patient was placed into dorsal lithotomy position and the perineum was prepped and draped in the usual fashion.  The laser scope was coupled the camera and then visually advanced into the bladder.  Bladder was thoroughly inspected.  Bladder was heavily trabeculated.  Both ureteral orifices were identified and had clear efflux.  Prostate was obstructing with trilobar BPH.  There was intravesical growth of the median lobe.  At this point the laser was set at 80 W and median lobe and lateral prostatic tissue was vaporized.  The power increased to 120 W and remaining tissue vaporized to the level of the verumontanum.  Power was then increased to 280 W and the remaining obstructive tissue vaporized.  Bleeders were coagulated.  The laser was then removed and 10 cc of viscous Xylocaine instilled within the urethra.  A 20 French silicone catheter was placed and irrigated until clear.  A B&O suppository was placed.  Procedure was then terminated and patient transferred to the recovery room in stable condition.

## 2018-01-15 NOTE — Anesthesia Procedure Notes (Signed)
Procedure Name: Intubation Date/Time: 01/15/2018 2:49 PM Performed by: Martha Clan, MD Pre-anesthesia Checklist: Patient identified, Emergency Drugs available, Suction available, Patient being monitored and Timeout performed Patient Re-evaluated:Patient Re-evaluated prior to induction Oxygen Delivery Method: Circle system utilized Preoxygenation: Pre-oxygenation with 100% oxygen Induction Type: IV induction Ventilation: Mask ventilation without difficulty Laryngoscope Size: Mac and 4 Grade View: Grade I Tube type: Oral Tube size: 7.5 mm Number of attempts: 1 Airway Equipment and Method: Stylet Placement Confirmation: ETT inserted through vocal cords under direct vision,  positive ETCO2 and breath sounds checked- equal and bilateral Secured at: 23 cm Tube secured with: Tape Dental Injury: Teeth and Oropharynx as per pre-operative assessment

## 2018-01-15 NOTE — H&P (Signed)
Date of Initial H&P: 01/07/18  History reviewed, patient examined, no change in status, stable for surgery.

## 2018-01-15 NOTE — Anesthesia Post-op Follow-up Note (Signed)
Anesthesia QCDR form completed.        

## 2018-01-15 NOTE — Progress Notes (Signed)
Dr Artis Flockwolfe in to irrigate foley clots and bloody urine

## 2018-01-15 NOTE — Anesthesia Postprocedure Evaluation (Signed)
Anesthesia Post Note  Patient: Austin MarrowRobert D Stewart  Procedure(s) Performed: GREEN LIGHT LASER TURP (TRANSURETHRAL RESECTION OF PROSTATE (N/A Prostate)  Patient location during evaluation: PACU Anesthesia Type: General Level of consciousness: awake and alert Pain management: pain level controlled Vital Signs Assessment: post-procedure vital signs reviewed and stable Respiratory status: spontaneous breathing, nonlabored ventilation, respiratory function stable and patient connected to nasal cannula oxygen Cardiovascular status: blood pressure returned to baseline and stable Postop Assessment: no apparent nausea or vomiting Anesthetic complications: no     Last Vitals:  Vitals:   01/15/18 1713 01/15/18 1722  BP: 133/81 139/74  Pulse: 60 64  Resp: 12 16  Temp: (!) 36.3 C 36.6 C  SpO2: 100% 100%    Last Pain:  Vitals:   01/15/18 1722  TempSrc: Temporal  PainSc: 2                  Yevette EdwardsJames G Adams

## 2018-01-16 ENCOUNTER — Encounter: Payer: Self-pay | Admitting: Urology

## 2018-01-29 MED FILL — ROSUVASTATIN CALCIUM 5 MG T: 5 | 90 days supply | Qty: 45 | Fill #0

## 2018-01-30 DIAGNOSIS — N401 Enlarged prostate with lower urinary tract symptoms: Secondary | ICD-10-CM | POA: Diagnosis not present

## 2018-02-25 DIAGNOSIS — H524 Presbyopia: Secondary | ICD-10-CM | POA: Diagnosis not present

## 2018-02-25 DIAGNOSIS — H5203 Hypermetropia, bilateral: Secondary | ICD-10-CM | POA: Diagnosis not present

## 2018-02-25 DIAGNOSIS — H52223 Regular astigmatism, bilateral: Secondary | ICD-10-CM | POA: Diagnosis not present

## 2018-03-14 MED FILL — FLECAINIDE ACETATE 50 MG TA: 50 | 90 days supply | Qty: 180 | Fill #1

## 2018-03-14 MED FILL — METOPROLOL SUCCINATE ER 25: 25 | 90 days supply | Qty: 90 | Fill #1

## 2018-03-14 MED FILL — LEVOCETIRIZINE 5 MG TABLET: 5 | 30 days supply | Qty: 30 | Fill #0

## 2018-03-14 MED FILL — OMEPRAZOLE 20 MG CPDR: 20 | 90 days supply | Qty: 90 | Fill #1

## 2018-03-14 MED FILL — MONTELUKAST SOD 10 MG TAB: 10 | 90 days supply | Qty: 90 | Fill #1

## 2018-03-23 ENCOUNTER — Telehealth: Payer: 59 | Admitting: Nurse Practitioner

## 2018-03-23 DIAGNOSIS — J3089 Other allergic rhinitis: Secondary | ICD-10-CM

## 2018-03-23 DIAGNOSIS — J0100 Acute maxillary sinusitis, unspecified: Secondary | ICD-10-CM | POA: Diagnosis not present

## 2018-03-23 MED ORDER — FLUTICASONE PROPIONATE 50 MCG/ACT NA SUSP: NASAL | 3 refills | Status: DC

## 2018-03-23 NOTE — Progress Notes (Signed)

## 2018-03-30 ENCOUNTER — Telehealth: Payer: 59 | Admitting: Family

## 2018-03-30 DIAGNOSIS — J019 Acute sinusitis, unspecified: Secondary | ICD-10-CM

## 2018-03-30 DIAGNOSIS — B9689 Other specified bacterial agents as the cause of diseases classified elsewhere: Secondary | ICD-10-CM

## 2018-03-30 MED ORDER — DOXYCYCLINE HYCLATE 100 MG PO TABS: 100.0000 mg | ORAL_TABLET | Freq: Two times a day (BID) | ORAL | 0 refills | Status: DC

## 2018-03-30 NOTE — Progress Notes (Signed)

## 2018-04-10 MED FILL — LEVOCETIRIZINE 5 MG TABLET: 5 | 30 days supply | Qty: 30 | Fill #1

## 2018-05-24 MED FILL — ROSUVASTATIN CALCIUM 5 MG T: 5 | 90 days supply | Qty: 45 | Fill #1

## 2018-05-24 MED FILL — LEVOCETIRIZINE 5 MG TABLET: 5 | 30 days supply | Qty: 30 | Fill #2

## 2018-07-10 ENCOUNTER — Other Ambulatory Visit: Payer: 59

## 2018-07-10 DIAGNOSIS — N401 Enlarged prostate with lower urinary tract symptoms: Secondary | ICD-10-CM | POA: Diagnosis not present

## 2018-07-10 DIAGNOSIS — E785 Hyperlipidemia, unspecified: Secondary | ICD-10-CM | POA: Diagnosis not present

## 2018-07-10 DIAGNOSIS — R7989 Other specified abnormal findings of blood chemistry: Secondary | ICD-10-CM | POA: Diagnosis not present

## 2018-07-10 DIAGNOSIS — R7303 Prediabetes: Secondary | ICD-10-CM | POA: Diagnosis not present

## 2018-07-10 DIAGNOSIS — R3914 Feeling of incomplete bladder emptying: Secondary | ICD-10-CM | POA: Diagnosis not present

## 2018-07-10 DIAGNOSIS — Z114 Encounter for screening for human immunodeficiency virus [HIV]: Secondary | ICD-10-CM | POA: Diagnosis not present

## 2018-07-10 DIAGNOSIS — M150 Primary generalized (osteo)arthritis: Secondary | ICD-10-CM | POA: Diagnosis not present

## 2018-07-10 DIAGNOSIS — Z0000 Encounter for general adult medical examination without abnormal findings: Secondary | ICD-10-CM | POA: Diagnosis not present

## 2018-07-11 LAB — CBC WITH DIFFERENTIAL/PLATELET
ABSOLUTE MONOCYTES: 461 {cells}/uL (ref 200–950)
BASOS PCT: 1.2 %
Basophils Absolute: 77 cells/uL (ref 0–200)
EOS PCT: 4.5 %
Eosinophils Absolute: 288 cells/uL (ref 15–500)
HEMATOCRIT: 44.8 % (ref 38.5–50.0)
Hemoglobin: 15.3 g/dL (ref 13.2–17.1)
LYMPHS ABS: 1254 {cells}/uL (ref 850–3900)
MCH: 30.2 pg (ref 27.0–33.0)
MCHC: 34.2 g/dL (ref 32.0–36.0)
MCV: 88.4 fL (ref 80.0–100.0)
MPV: 10.3 fL (ref 7.5–12.5)
Monocytes Relative: 7.2 %
Neutro Abs: 4320 cells/uL (ref 1500–7800)
Neutrophils Relative %: 67.5 %
PLATELETS: 360 10*3/uL (ref 140–400)
RBC: 5.07 10*6/uL (ref 4.20–5.80)
RDW: 13.3 % (ref 11.0–15.0)
TOTAL LYMPHOCYTE: 19.6 %
WBC: 6.4 10*3/uL (ref 3.8–10.8)

## 2018-07-11 LAB — COMPLETE METABOLIC PANEL WITH GFR
AG Ratio: 2.2 (calc) (ref 1.0–2.5)
ALBUMIN MSPROF: 4.6 g/dL (ref 3.6–5.1)
ALKALINE PHOSPHATASE (APISO): 76 U/L (ref 35–144)
ALT: 27 U/L (ref 9–46)
AST: 25 U/L (ref 10–35)
BILIRUBIN TOTAL: 0.8 mg/dL (ref 0.2–1.2)
BUN: 22 mg/dL (ref 7–25)
CHLORIDE: 107 mmol/L (ref 98–110)
CO2: 27 mmol/L (ref 20–32)
Calcium: 9.8 mg/dL (ref 8.6–10.3)
Creat: 1.17 mg/dL (ref 0.70–1.25)
GFR, Est African American: 76 mL/min/{1.73_m2} (ref >=60)
GFR, Est Non African American: 65 mL/min/{1.73_m2} (ref >=60)
GLOBULIN: 2.1 g/dL (ref 1.9–3.7)
Glucose, Bld: 94 mg/dL (ref 65–99)
POTASSIUM: 4.7 mmol/L (ref 3.5–5.3)
SODIUM: 142 mmol/L (ref 135–146)
Total Protein: 6.7 g/dL (ref 6.1–8.1)

## 2018-07-11 LAB — HIV ANTIBODY (ROUTINE TESTING W REFLEX): HIV: NONREACTIVE

## 2018-07-11 LAB — HEMOGLOBIN A1C
HEMOGLOBIN A1C: 5.4 %{Hb} (ref <5.7)
MEAN PLASMA GLUCOSE: 108 (calc)
eAG (mmol/L): 6 (calc)

## 2018-07-11 LAB — LIPID PANEL
CHOL/HDL RATIO: 2 (calc) (ref <5.0)
CHOLESTEROL: 115 mg/dL (ref <200)
HDL: 58 mg/dL (ref >=40)
LDL CHOLESTEROL (CALC): 42 mg/dL
Non-HDL Cholesterol (Calc): 57 mg/dL (calc) (ref <130)
TRIGLYCERIDES: 73 mg/dL (ref <150)

## 2018-07-17 ENCOUNTER — Ambulatory Visit (INDEPENDENT_AMBULATORY_CARE_PROVIDER_SITE_OTHER): Payer: 59 | Admitting: Family Medicine

## 2018-07-17 ENCOUNTER — Encounter: Payer: Self-pay | Admitting: Family Medicine

## 2018-07-17 ENCOUNTER — Other Ambulatory Visit: Payer: Self-pay

## 2018-07-17 VITALS — BP 113/69 | HR 66 | Temp 98.3°F | Resp 16 | Ht 70.0 in | Wt 193.0 lb

## 2018-07-17 DIAGNOSIS — Z1211 Encounter for screening for malignant neoplasm of colon: Secondary | ICD-10-CM | POA: Diagnosis not present

## 2018-07-17 DIAGNOSIS — Z23 Encounter for immunization: Secondary | ICD-10-CM

## 2018-07-17 DIAGNOSIS — N401 Enlarged prostate with lower urinary tract symptoms: Secondary | ICD-10-CM

## 2018-07-17 DIAGNOSIS — M150 Primary generalized (osteo)arthritis: Secondary | ICD-10-CM | POA: Diagnosis not present

## 2018-07-17 DIAGNOSIS — M180 Bilateral primary osteoarthritis of first carpometacarpal joints: Secondary | ICD-10-CM

## 2018-07-17 DIAGNOSIS — R7303 Prediabetes: Secondary | ICD-10-CM | POA: Diagnosis not present

## 2018-07-17 DIAGNOSIS — J302 Other seasonal allergic rhinitis: Secondary | ICD-10-CM

## 2018-07-17 DIAGNOSIS — Z0000 Encounter for general adult medical examination without abnormal findings: Secondary | ICD-10-CM | POA: Diagnosis not present

## 2018-07-17 DIAGNOSIS — K219 Gastro-esophageal reflux disease without esophagitis: Secondary | ICD-10-CM | POA: Diagnosis not present

## 2018-07-17 DIAGNOSIS — I471 Supraventricular tachycardia: Secondary | ICD-10-CM

## 2018-07-17 DIAGNOSIS — M159 Polyosteoarthritis, unspecified: Secondary | ICD-10-CM

## 2018-07-17 DIAGNOSIS — R3914 Feeling of incomplete bladder emptying: Secondary | ICD-10-CM

## 2018-07-17 DIAGNOSIS — I5032 Chronic diastolic (congestive) heart failure: Secondary | ICD-10-CM

## 2018-07-17 IMAGING — MR MR SHOULDER*L* W/O CM
6 series · 40 of 40 positions shown · non-contrast
Comparison: None.

CLINICAL DATA: Left shoulder pain and painful range of motion.
Popping and cracking sensation.

EXAM:
MRI OF THE LEFT SHOULDER WITHOUT CONTRAST
TECHNIQUE: Multiplanar, multisequence MR imaging of the shoulder was performed.
No intravenous contrast was administered.

[Series 3: T2 fat-sat · axial · 4.0mm · 0.47mm/px · z∈[-55,+51]mm · 8 of 25 slices shown (1 of 3)]
[im 1/25]
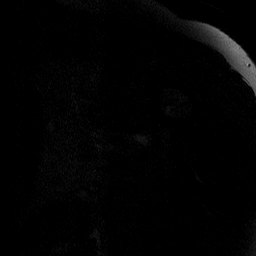
[im 4/25]
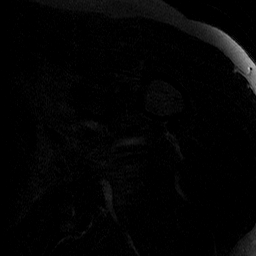
[im 7/25]
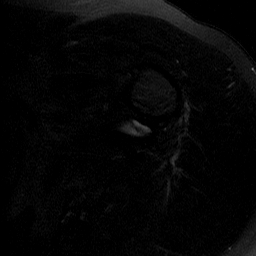
[im 11/25]
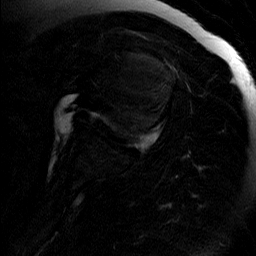
[im 14/25]
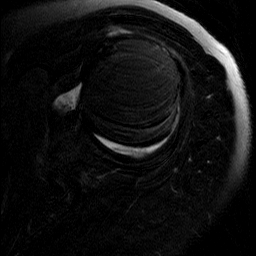
[im 18/25]
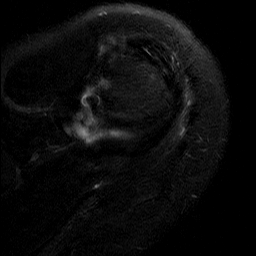
[im 21/25]
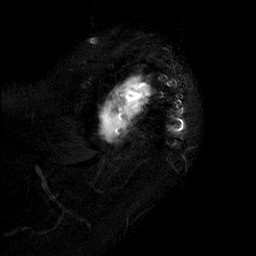
[im 25/25]
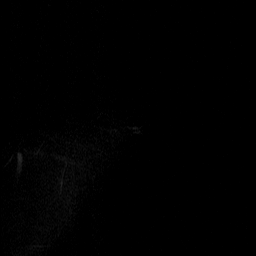

[Series 4: T2 fat-sat · oblique · 4.0mm · 0.62mm/px · 6 of 23 slices shown (2 of 3)]
[im 1/23]
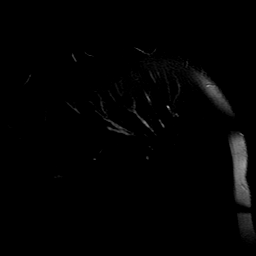
[im 5/23]
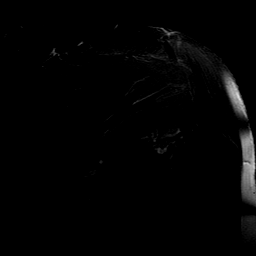
[im 9/23]
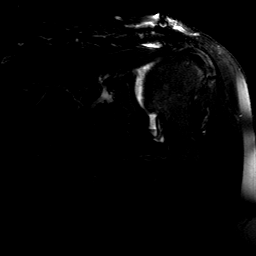
[im 14/23]
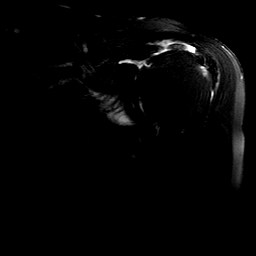
[im 18/23]
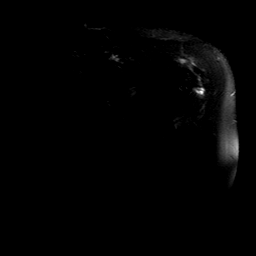
[im 23/23]
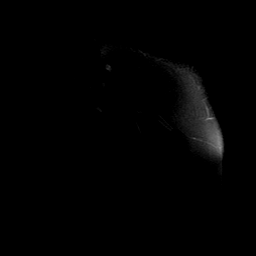

[Series 5: PD · oblique · 4.0mm · 0.62mm/px · 6 of 23 slices shown]
[im 1/23]
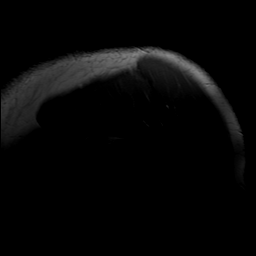
[im 5/23]
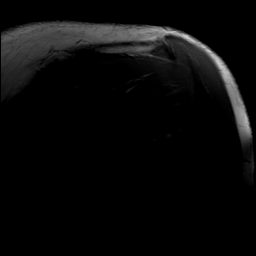
[im 9/23]
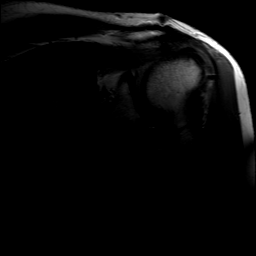
[im 14/23]
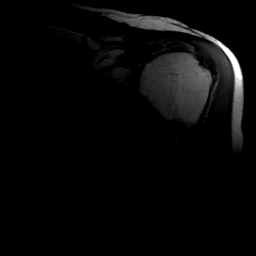
[im 18/23]
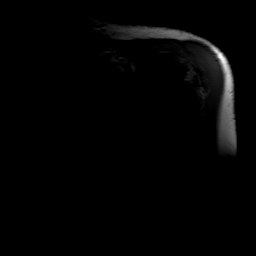
[im 23/23]
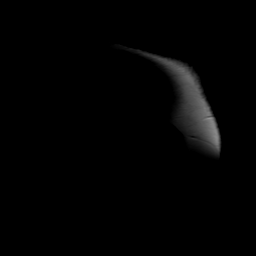

[Series 6: T1 fat-sat · oblique · non-contrast · 4.0mm · 0.62mm/px · 6 of 23 slices shown]
[im 1/23]
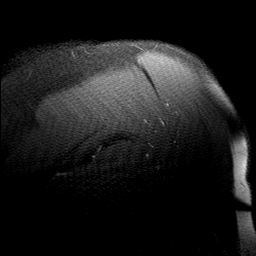
[im 5/23]
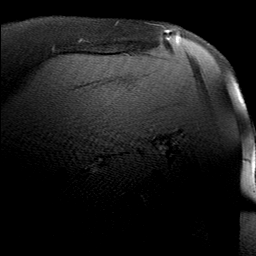
[im 9/23]
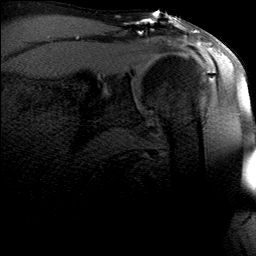
[im 14/23]
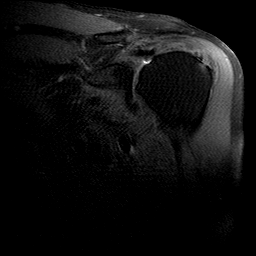
[im 18/23]
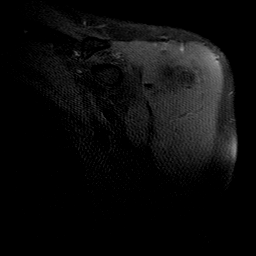
[im 23/23]
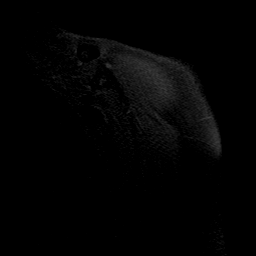

[Series 7: T1 · oblique · 4.0mm · 0.62mm/px · 7 of 25 slices shown]
[im 1/25]
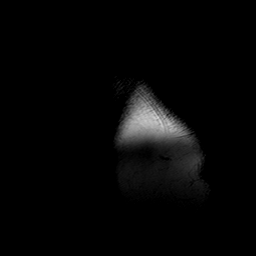
[im 5/25]
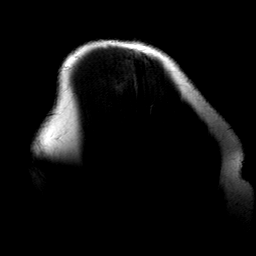
[im 9/25]
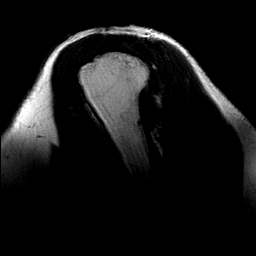
[im 13/25]
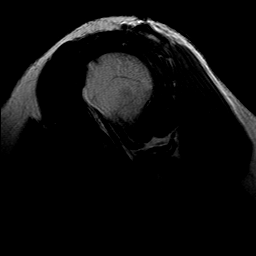
[im 17/25]
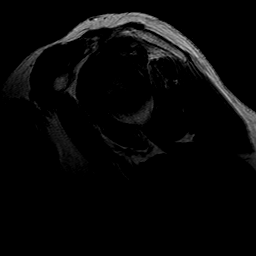
[im 21/25]
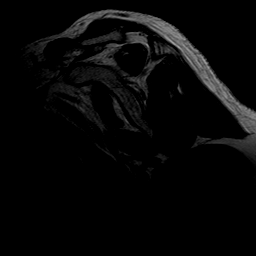
[im 25/25]
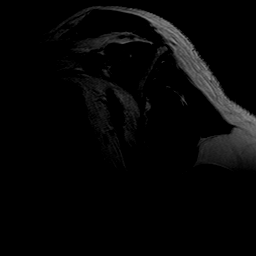

[Series 8: T2 fat-sat · oblique · 4.0mm · 0.62mm/px · 7 of 25 slices shown (3 of 3)]
[im 1/25]
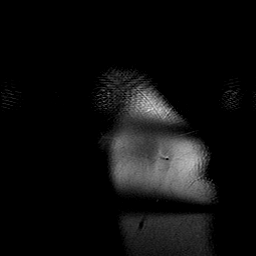
[im 5/25]
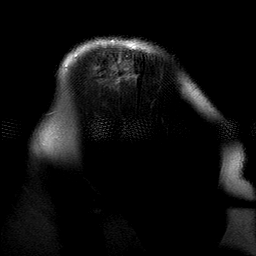
[im 9/25]
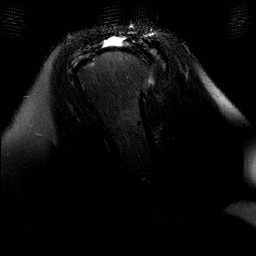
[im 13/25]
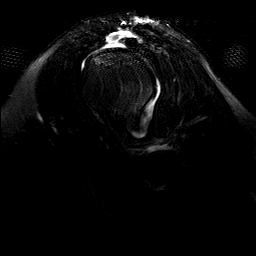
[im 17/25]
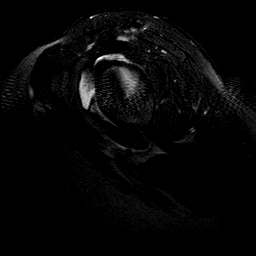
[im 21/25]
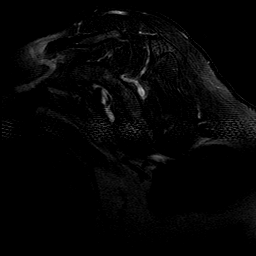
[im 25/25]
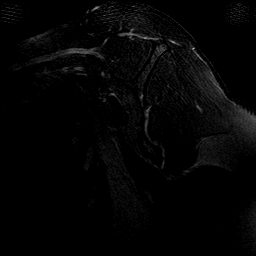

[40 of 40 positions shown; findings below may reference images not displayed]

FINDINGS: Despite efforts by the technologist and patient, motion artifact is
present on today's exam and could not be eliminated. This reduces
exam sensitivity and specificity.

Rotator cuff: Large full-thickness partial width tear of the
supraspinatus tendon centrally with the gap in the tendon measuring
2.6 cm longitudinally and 1.1 cm in width. Severely attenuated and
thinned anterior portion of the supraspinatus tendon.

Prominent partial thickness articular surface tearing of the
subscapularis tendon causing prominent thinning of the
subscapularis. Infraspinatus tendon intact.

Muscles:  Subscapularis muscular atrophy.

Biceps long head: Nonvisualization of the biceps, query biceps
tenodesis, tenotomy, or rupture.

Acromioclavicular Joint: Prior acromioplasty with metal artifact
adjacent to the AC joint and distal acromion. Type I acromion. As
expected there is fluid in the subacromial subdeltoid bursa related
to the full-thickness tear.

Glenohumeral Joint: Shoulder joint effusion with mild synovitis.
Moderate degenerative chondral thinning and glenohumeral spurring.

Labrum:  Slightly blunted superior labrum otherwise unremarkable.

Bones: No significant extra-articular osseous abnormalities
identified.

Other: No supplemental non-categorized findings.
IMPRESSION: 1. Full-thickness partial width tear centrally in the supraspinatus
tendon, measuring 2.6 cm longitudinal by 1.1 cm in width. Prominent
thinning of the residual anterior part of the supraspinatus tendon.
2. Prominent partial thickness articular surface tearing of the
subscapularis tendon associated with some subscapularis muscular
atrophy.
3. Nonvisualization of the intra-articular segment long-head of the
biceps which could be due to prior tenodesis, tenotomy, or rupture.
4. Prior acromioplasty.
5. Moderate degenerative glenohumeral joint arthropathy. Mild
synovitis in the shoulder joint along with a shoulder joint
effusion.
6. Blunted superior labrum, possibly partially resected in the past.

## 2018-07-17 MED ORDER — ESOMEPRAZOLE MAGNESIUM 20 MG PO CPDR: 20.0000 mg | DELAYED_RELEASE_CAPSULE | Freq: Every day | ORAL | 3 refills | Status: DC

## 2018-07-17 MED ORDER — MELOXICAM 15 MG PO TABS: 15.0000 mg | ORAL_TABLET | Freq: Every day | ORAL | 3 refills | Status: DC | PRN

## 2018-07-17 MED ORDER — LEVOCETIRIZINE DIHYDROCHLORIDE 5 MG PO TABS: 5.0000 mg | ORAL_TABLET | Freq: Every evening | ORAL | 3 refills | Status: DC

## 2018-07-17 NOTE — Patient Instructions (Addendum)
Thank you for coming to the office today.  Lab results are all excellent  We will check PSA next year. Dr Yves Dill checked it in 12/2017 by our records but we do not have copy of the lab test result.  If not improving arthritis / hand / thumbs - notify me with by message or phone and we can use Topical Diclofenac gel for hands / thumb joints - use as needed 2-3 times a day, if not covered - may use a coupon printed for [facility] at [facility].  Refilled Meloxicam take a little bit more often for now - can do up to 1-2 weeks regularly then stop for 1-2 weeks.  Recommend to start taking Tylenol Extra Strength 548m tabs - take 1 to 2 tabs per dose (max 10058m every 6-8 hours for pain (take regularly, don't skip a dose for next 7 days), max 24 hour daily dose is 6 tablets or 300032mIn the future you can repeat the same everyday Tylenol course for 1-2 weeks at a time.   In future if need to return to Orthopedics we can help as well.  May need to return to Dr HyaMilinda Pointerr foot if not improved.  ------------------------------  Call Dermatology to see if they can establish with you for a regular skin cancer screening, and few spot treatment on face with AKs - may need freezing.  [id number]: 8AM-5PM Phone: [phone] SerD   Colon Cancer Screening: - For all adults age 70+50+utine colon cancer screening is highly recommended.     - Recent guidelines from [place] starting age of 45 72Early detection of colon cancer is important, because often there are no warning signs or symptoms, also if found early usually it can be cured. Late stage is hard to treat.  - If you are not interested in Colonoscopy screening (if done and normal you could be cleared for 5 to 10 years until next due), then Cologuard is an excellent alternative for screening test for Colon Cancer. It is highly sensitive for detecting DNA of  colon cancer from even the earliest stages. Also, there is NO bowel prep required. - If Cologuard is NEGATIVE, then it is good for 3 years before next due - If Cologuard is POSITIVE, then it is strongly advised to get a Colonoscopy, which allows the GI doctor to locate the source of the cancer or polyp (even very early stage) and treat it by removing it. ------------------------- If you would like to proceed with Cologuard (stool DNA test) - FIRST, call your insurance company and tell them you want to check cost of Cologuard tell them CPT Code [phone] may be completely covered and you could get for no cost, OR max cost without any coverage is about $600). Also, keep in mind if you do NOT open the kit, and decide not to do the test, you will NOT be charged, you should contact the company if you decide not to do the test. - If you want to proceed, you can notify us [place] message, [employer] at next visit) and we will order it for you. The test kit will be delivered to you house within about 1 week. Follow instructions to collect sample, you may call the company for any help or questions, 24/7 telephone support at [phone] Please schedule a Follow-up Appointment to: Return in about 1 year (around 07/17/2019)  for Annual Physical.  If you have any other questions or concerns, please feel free to call the office or send a message through [place]. You may also schedule an earlier appointment if necessary.  Additionally, you may be receiving a survey about your experience at our office within a few days to 1 week by e-mail or mail. We value your feedback.  Nobie Putnam, DO [place]

## 2018-07-17 NOTE — Progress Notes (Signed)
Subjective:    Patient ID: Austin Stewart, male    DOB: 1955/10/05, 62 y.o.   MRN: [id number]  Austin Stewart is a 63 y.o. male presenting on 07/17/2018 for Annual Exam   HPI  Here for Annual Physical and Lab Review.  Osteoarthritis, bilateral thumb joints CMC, other joints See prior note for background. Has seen [facility] - 3-4 years ago, CMC bilateral bone on bone, candidate for surgery - long recovery he is trying to avoid this - Also for shoulder, Left, he had surgery to fix tendons but he is avoiding surgery - Currently still has some episodic painful and swelling, mostly thumbs and hands, sometimes shoulders, and L foot - Has seen Podiatry for plantar fasciitis Dr Al Corpus - He takes Meloxicam PRN currently temporary relief, not taking regularly - Not taken other meds before such as Gabapentin or Tramadol. - He has taken muscle relaxant, in past, Baclofen causes sedation can only take at night, he prefers to avoid this. [facility] - [facility]iatry [facility] 08/2017, had injection and meloxicam,   Cardiology / history of SVT w/ palpitations / H/o Mitral Valve Repair / Diastolic CHF History of HLD Followed by Cardiology, next visit next week. Prior history of mild elevated lipids, has been stable and controlled now on statin intermittent Continues on Flecanide and low dose BB metoprolol Not due for repeat ECHO or other intervention. Has been euvolemic Had CT angio of heart in past, < 10% build-up in veins by report Tolerating Rosuva  every other day, improved lower LDL  BPH, with LUTS Prior [facility], now most recently seen by Dr Pamelia Hoit, 12/2017 had prostate laser surgery, now voiding better, no longer needs tamsulosin.  Allergic Rhinitis - using Xyzal  daily, instead of Singulair  GERD Recently some stress at work, tried Nexium - improved over omeprazole, prefers  AK (actinic keratoses) Rough patches of inflamed skin on face, sun exposed, no prior derm  or treatment, interested  Health Maintenance:  Last PSA done by Urology Dr Evelene Croon - negative, does not have result. - will re-check in 06/2019  Colon CA Screening: Last Colonoscopy (done 10 year ago), results with no polyps, good for 10 years. Currently asymptomatic. No known family history of colon CA. Due for screening test - request cologuard   Depression screen [facility] 2/9 07/17/2018 01/11/2018 07/10/2017  Decreased Interest 0 0 0  Down, Depressed, Hopeless 0 0 0  PHQ - 2 Score 0 0 0    Past Medical History:  Diagnosis Date  . Allergic rhinitis, cause unspecified   . Allergy   . Cardiac dysrhythmia, unspecified   . Diverticulosis of colon (without mention of hemorrhage)   . Dysrhythmia   . Esophageal reflux   . Measles without mention of complication   . Mumps without mention of complication   . 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 CARDIAC CATHETERIZATION  2006   [facility]   . FOOT SURGERY  03/2017   right great toe Dr.Hyatt   . GREEN LIGHT LASER TURP (TRANSURETHRAL RESECTION OF PROSTATE N/A 01/15/2018   Procedure: GREEN LIGHT LASER TURP (TRANSURETHRAL RESECTION OF PROSTATE;  Surgeon: Orson Ape, MD;  Location: [facility] ORS;  Service: Urology;  Laterality: N/A;  . HERNIA REPAIR    . MITRAL VALVE REPAIR  2006  . rotator cuff repair    . SHOULDER ARTHROSCOPY Left 08/24/2016  Procedure: ARTHROSCOPY SHOULDER with debridement;  Surgeon: Juanell FairlyKevin Krasinski, MD;  Location: [facility] ORS;  Service: Orthopedics;  Laterality: Left;  . TONSILLECTOMY     Social History   Socioeconomic History  . Marital status: Married    Spouse name: Not on file  . Number of children: Not on file  . Years of education: Not on file  . Highest education level: Not on file  Occupational History  . Not on file  Social Needs  . Financial resource strain: Not on file  . Food insecurity:    Worry: Not on file     Inability: Not on file  . Transportation needs:    Medical: Not on file    Non-medical: Not on file  Tobacco Use  . Smoking status: Never Smoker  . Smokeless tobacco: Never Used  Substance and Sexual Activity  . Alcohol use: No  . Drug use: No  . Sexual activity: Not on file  Lifestyle  . Physical activity:    Days per week: Not on file    Minutes per session: Not on file  . Stress: Not on file  Relationships  . Social connections:    Talks on phone: Not on file    Gets together: Not on file    Attends religious service: Not on file    Active member of club or organization: Not on file    Attends meetings of clubs or organizations: Not on file    Relationship status: Not on file  . Intimate partner violence:    Fear of current or ex partner: Not on file    Emotionally abused: Not on file    Physically abused: Not on file    Forced sexual activity: Not on file  Other Topics Concern  . Not on file  Social History Narrative  . Not on file   Family History  Problem Relation Age of Onset  . Heart disease Mother        s/p stent placement  . Hypertension Mother   . Emphysema Father   . Dementia Father   . Cancer Maternal Uncle        kidney cancer   Current Outpatient Medications on File Prior to Visit  Medication Sig  . aspirin 81 MG tablet Take 81 mg by mouth daily.  . flecainide (TAMBOCOR) 50 MG tablet Take 1 tablet (50 mg total) by mouth 2 (two) times daily.  . fluticasone (FLONASE) 50 MCG/ACT nasal spray PLACE 2 SPRAYS INTO BOTH NOSTRILS DAILY  . Hyoscyamine Sulfate SL (LEVSIN/SL) 0.125 MG SUBL Place 0.125 mg under the tongue every 4 (four) hours as needed. 1-2 TABS  . metoprolol succinate (TOPROL-XL) 25 MG 24 hr tablet Take 1 tablet (25 mg total) by mouth daily. (Patient taking differently: Take 25 mg by mouth every evening. )  . Multiple Vitamins-Minerals (MULTIVITAMIN WITH MINERALS) tablet Take 1 tablet by mouth daily.  Marland Kitchen. OVER THE COUNTER MEDICATION Place 20  drops under the tongue 2 (two) times daily. CBD Oil  . rosuvastatin (CRESTOR) 5 MG tablet Take 1 tablet (5 mg total) by mouth every other day. (Patient taking differently: Take 5 mg by mouth every other day. )  . Turmeric 500 MG CAPS Take 500 mg by mouth 2 (two) times daily.    No current facility-administered medications on file prior to visit.     Review of Systems  Constitutional: Negative for activity change, appetite change, chills, diaphoresis, fatigue and fever.  HENT: Negative for congestion and hearing  loss.   Eyes: Negative for visual disturbance.  Respiratory: Negative for apnea, cough, choking, chest tightness, shortness of breath and wheezing.   Cardiovascular: Negative for chest pain, palpitations and leg swelling.  Gastrointestinal: Negative for abdominal pain, constipation, diarrhea, nausea and vomiting.  Endocrine: Negative for cold intolerance.  Genitourinary: Negative for decreased urine volume, difficulty urinating, dysuria, frequency and hematuria.  Musculoskeletal: Negative for arthralgias and neck pain.  Skin: Negative for rash.  Allergic/Immunologic: Negative for environmental allergies.  Neurological: Negative for dizziness, weakness, light-headedness, numbness and headaches.  Hematological: Negative for adenopathy.  Psychiatric/Behavioral: Negative for behavioral problems, dysphoric mood and sleep disturbance.   Per HPI unless specifically indicated above     Objective:    BP 113/69   Pulse 66   Temp 98.3 F (36.8 C) (Oral)   Resp 16   Ht 5\' 10"  (1.778 m)   Wt 193 lb (87.5 kg)   BMI 27.69 kg/m   Wt Readings from Last 3 Encounters:  07/17/18 193 lb (87.5 kg)  01/15/18 185 lb (83.9 kg)  01/11/18 189 lb (85.7 kg)    Physical Exam Vitals signs and nursing note reviewed.  Constitutional:      General: He is not in acute distress.    Appearance: He is well-developed. He is not diaphoretic.     Comments: Well-appearing, comfortable, cooperative   HENT:     Head: Normocephalic and atraumatic.     Comments: Frontal / maxillary sinuses non-tender. Nares patent without congestion  Bilateral TMs clear without erythema, effusion or bulging. Oropharynx clear without erythema, exudates, edema or asymmetry. Eyes:     General:        Right eye: No discharge.        Left eye: No discharge.     Conjunctiva/sclera: Conjunctivae normal.     Pupils: Pupils are equal, round, and reactive to light.  Neck:     Musculoskeletal: Normal range of motion and neck supple.     Thyroid: No thyromegaly.  Cardiovascular:     Rate and Rhythm: Normal rate and regular rhythm.     Heart sounds: Normal heart sounds. No murmur.  Pulmonary:     Effort: Pulmonary effort is normal. No respiratory distress.     Breath sounds: Normal breath sounds. No wheezing or rales.  Abdominal:     General: Bowel sounds are normal. There is no distension.     Palpations: Abdomen is soft. There is no mass.     Tenderness: There is no abdominal tenderness.  Musculoskeletal: Normal range of motion.        General: No tenderness.     Comments: Upper / Lower Extremities: - Normal muscle tone, strength bilateral upper extremities 5/5, lower extremities 5/5  Lymphadenopathy:     Cervical: No cervical adenopathy.  Skin:    General: Skin is warm and dry.     Findings: No erythema or rash.     Comments: Bilateral temples with dry patchy slightly red patch - consistent with AK  Neurological:     Mental Status: He is alert and oriented to person, place, and time.     Comments: Distal sensation intact to light touch all extremities  Psychiatric:        Behavior: Behavior normal.     Comments: Well groomed, good eye contact, normal speech and thoughts    Results for orders placed or performed in visit on 07/10/18  HIV Antibody (routine testing w rflx)  Result Value Ref Range   HIV  1&2 Ab, 4th Generation NON-REACTIVE NON-REACTI  Lipid panel  Result Value Ref Range    Cholesterol 115 <200 mg/dL   HDL 58 > OR = 40 mg/dL   Triglycerides 73 <161 mg/dL   LDL Cholesterol (Calc) 42 mg/dL (calc)   Total CHOL/HDL Ratio 2.0 <5.0 (calc)   Non-HDL Cholesterol (Calc) 57 <096 mg/dL (calc)  COMPLETE METABOLIC PANEL WITH GFR  Result Value Ref Range   Glucose, Bld 94 65 - 99 mg/dL   BUN 22 7 - 25 mg/dL   Creat 0.45 4.09 - 8.11 mg/dL   GFR, Est Non African American 65 > OR = 60 mL/min/1.78m2   GFR, Est African American 76 > OR = 60 mL/min/1.42m2   BUN/Creatinine Ratio NOT APPLICABLE 6 - 22 (calc)   Sodium 142 135 - 146 mmol/L   Potassium 4.7 3.5 - 5.3 mmol/L   Chloride 107 98 - 110 mmol/L   CO2 27 20 - 32 mmol/L   Calcium 9.8 8.6 - 10.3 mg/dL   Total Protein 6.7 6.1 - 8.1 g/dL   Albumin 4.6 3.6 - 5.1 g/dL   Globulin 2.1 1.9 - 3.7 g/dL (calc)   AG Ratio 2.2 1.0 - 2.5 (calc)   Total Bilirubin 0.8 0.2 - 1.2 mg/dL   Alkaline phosphatase (APISO) 76 35 - 144 U/L   AST 25 10 - 35 U/L   ALT 27 9 - 46 U/L  CBC with Differential/Platelet  Result Value Ref Range   WBC 6.4 3.8 - 10.8 Thousand/uL   RBC 5.07 4.20 - 5.80 Million/uL   Hemoglobin 15.3 13.2 - 17.1 g/dL   HCT 91.4 78.2 - 95.6 %   MCV 88.4 80.0 - 100.0 fL   MCH 30.2 27.0 - 33.0 pg   MCHC 34.2 32.0 - 36.0 g/dL   RDW 21.3 08.6 - 57.8 %   Platelets 360 140 - 400 Thousand/uL   MPV 10.3 7.5 - 12.5 fL   Neutro Abs 4,320 1,500 - 7,800 cells/uL   Lymphs Abs 1,254 850 - 3,900 cells/uL   Absolute Monocytes 461 200 - 950 cells/uL   Eosinophils Absolute 288 15 - 500 cells/uL   Basophils Absolute 77 0 - 200 cells/uL   Neutrophils Relative % 67.5 %   Total Lymphocyte 19.6 %   Monocytes Relative 7.2 %   Eosinophils Relative 4.5 %   Basophils Relative 1.2 %  Hemoglobin A1c  Result Value Ref Range   Hgb A1c MFr Bld 5.4 <5.7 % of total Hgb   Mean Plasma Glucose 108 (calc)   eAG (mmol/L) 6.0 (calc)      Assessment & Plan:   Problem List Items Addressed This Visit    BPH (benign prostatic hyperplasia)     Followed by Dr Evelene Croon Improved s/p procedure Last PSA negative 12/2017 no result available per Dr Evelene Croon Off Tamsulosin      Diastolic CHF (HCC)    Stable, controlled, euvolemic Followed by Cardiology      GERD without esophagitis    Recurrent symptoms Switch off Omeprazole 20 to Esomeprazole 20mg  daily new rx sent      Relevant Medications   esomeprazole (NEXIUM) 20 MG capsule   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 Not on any therapy currently  2. 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, primarily hip and other joints affecting his chronic daily symptoms  Plan Continue variety of medication management - Refilled Meloxicam  daily PRN - use intermittent 1-2 weeks max, or less intermittently, avoid excess NSAID for kidney health reviewed - Continue Tylenol - Future variety of option such as further imaging, x-ray, PT, vs Ortho - also other meds gabapentin / tramadol if needed - but he declines these meds  Has already been recommended surgery for thumb CMC by [facility] but he declined - avoiding surgery now  Offer future Diclofenac topical gel PRN use in future if requested      Relevant Medications   meloxicam (MOBIC) 15 MG tablet   Seasonal allergies   Relevant Medications   levocetirizine (XYZAL) 5 MG tablet    Other Visit Diagnoses    Annual physical exam    -  Primary   Screen for colon cancer       Relevant Orders   Cologuard   Need for shingles vaccine       Relevant Orders   Varicella-zoster vaccine IM (Completed)     Updated  Health Maintenance information - ordered cologuard, counseling provided Reviewed recent lab results with patient Encouraged improvement to lifestyle with diet and exercise  Repeat Shingrix in 2-6 months   Meds ordered this encounter  Medications  . esomeprazole (NEXIUM) 20 MG capsule    Sig: Take 1 capsule (20 mg total) by mouth daily before breakfast.    Dispense:  90 capsule    Refill:  3    Changed from omeprazole  . levocetirizine (XYZAL) 5 MG tablet    Sig: Take 1 tablet (5 mg total) by mouth every evening.    Dispense:  90 tablet    Refill:  3  . meloxicam (MOBIC) 15 MG tablet    Sig: Take 1 tablet (15 mg total) by mouth daily as needed for pain. Up to 1-2 weeks max then reduce    Dispense:  30 tablet    Refill:  3    Follow up plan: Return in about 1 year (around 07/17/2019) for Annual Physical.  Saralyn Pilar, DO [facility] 07/17/2018, 3:17 PM

## 2018-07-18 ENCOUNTER — Other Ambulatory Visit: Payer: Self-pay | Admitting: Cardiovascular Disease

## 2018-07-18 DIAGNOSIS — I471 Supraventricular tachycardia, unspecified: Secondary | ICD-10-CM | POA: Insufficient documentation

## 2018-07-18 NOTE — Assessment & Plan Note (Signed)
Followed by Dr Evelene Croon Improved s/p procedure Last PSA negative 12/2017 no result available per Dr Evelene Croon Off Tamsulosin

## 2018-07-18 NOTE — Assessment & Plan Note (Signed)
Followed by Cardiology On anti arrhythmic, BB 

## 2018-07-18 NOTE — Assessment & Plan Note (Signed)
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, primarily hip and other joints affecting his chronic daily symptoms  Plan Continue variety of medication management - Refilled Meloxicam 15mg  daily PRN - use intermittent 1-2 weeks max, or less intermittently, avoid excess NSAID for kidney health reviewed - Continue Tylenol - Future variety of option such as further imaging, x-ray, PT, vs Ortho - also other meds gabapentin / tramadol if needed - but he declines these meds  Has already been recommended surgery for thumb CMC by [facility] but he declined - avoiding surgery now  Offer future Diclofenac topical gel PRN use in future if requested

## 2018-07-18 NOTE — Assessment & Plan Note (Signed)
Stable, controlled, euvolemic Followed by Cardiology

## 2018-07-18 NOTE — Assessment & Plan Note (Signed)
Well-controlled Pre-DM with A1c 5.4  Plan:  1. Not on any therapy currently  2. Encourage improved lifestyle - low carb, low sugar diet, reduce portion size, continue improving regular exercise

## 2018-07-18 NOTE — Assessment & Plan Note (Signed)
See A&P OA 

## 2018-07-18 NOTE — Assessment & Plan Note (Signed)
Recurrent symptoms Switch off Omeprazole 20 to Esomeprazole 20mg  daily new rx sent

## 2018-07-24 ENCOUNTER — Telehealth: Payer: Self-pay

## 2018-07-24 NOTE — Telephone Encounter (Signed)
Left voicemail message to call back to review appointment information and prescreening questions.

## 2018-07-24 NOTE — Telephone Encounter (Signed)
Called patient to make him aware that Dr. Mariah Milling is offering video and telephone visits due to the COVID19.  Patient was not interested in a video visit or Telephone visit.

## 2018-07-24 NOTE — Telephone Encounter (Signed)
Needs screening

## 2018-07-25 NOTE — Telephone Encounter (Signed)
   Cardiac Questionnaire:    Since your last visit or hospitalization:    1. Have you been having new or worsening chest pain? No   2. Have you been having new or worsening shortness of breath? No 3. Have you been having new or worsening leg swelling, wt gain, or increase in abdominal girth (pants fitting more tightly)? No   4. Have you had any passing out spells? No    *A YES to any of these questions would result in the appointment being kept. *If all the answers to these questions are NO, we should indicate that given the current situation regarding the worldwide coronarvirus pandemic, at the recommendation of the CDC, we are looking to limit gatherings in our waiting area, and thus will reschedule their appointment beyond four weeks from today.   _____________   COVID-19 Pre-Screening Questions:  . Do you currently have a fever? No (yes = cancel and refer to pcp for e-visit) . Have you recently travelled on a cruise, internationally, or to [place], [place], [place], [place], [place], or [place], [place] [employer]) ? No (yes = cancel, stay home, monitor symptoms, and contact pcp or initiate e-visit if symptoms develop) . Have you been in contact with someone that is currently pending confirmation of Covid19 testing or has been confirmed to have the Covid19 virus?  No (yes = cancel, stay home, away from tested individual, monitor symptoms, and contact pcp or initiate e-visit if symptoms develop) . Are you currently experiencing fatigue or cough? No (yes = pt should be prepared to have a mask placed at the time of their visit).       Patient refuses virtual or video visits and only wants to come in to be seen. Advised that we will send him to a pool and will call him at a later date to get this scheduled. He was agreeable with this and had no further questions at this time.

## 2018-07-29 ENCOUNTER — Ambulatory Visit: Payer: Self-pay | Admitting: Cardiovascular Disease

## 2018-08-04 DIAGNOSIS — Z1212 Encounter for screening for malignant neoplasm of rectum: Secondary | ICD-10-CM | POA: Diagnosis not present

## 2018-08-04 DIAGNOSIS — Z1211 Encounter for screening for malignant neoplasm of colon: Secondary | ICD-10-CM | POA: Diagnosis not present

## 2018-08-04 LAB — COLOGUARD: Cologuard: NEGATIVE

## 2018-08-08 NOTE — Telephone Encounter (Signed)
Recall placed for August 

## 2018-08-09 ENCOUNTER — Other Ambulatory Visit: Payer: Self-pay

## 2018-09-24 ENCOUNTER — Telehealth: Payer: Self-pay

## 2018-09-24 ENCOUNTER — Encounter: Payer: Self-pay | Admitting: Physician Assistant

## 2018-09-24 NOTE — Telephone Encounter (Signed)

## 2018-09-24 NOTE — Progress Notes (Signed)
Cardiology Office Note Date:  09/25/2018  Patient ID:  Austin Stewart, DOB 12/10/1955, MRN [id number] PCP:  Smitty CordsKaramalegos, Alexander J, DO  Cardiologist:  Dr. Mariah MillingGollan, MD    Chief Complaint: Follow up  History of Present Illness: Austin Stewart is a 63 y.o. male with [facility] of [facility]obstruct[facility], reported pSVT on flecainide, HFpEF, mitral valve disease s/p remote valvulopasty in 2006 at [facility], HLD, and GERD who presents for follow up.   Patient was previously noted to have ectopy (details uncertain) in 2013 prior to shoulder surgery. Follow up stress test was negative for ischemia. Echo at that time showed an EF of 60-65%, no RWMA, well functioning mitral valve s/p valvulopasty with trace to mild MR, trace to mild AI, trace TR, left atrium normal in size, right atrium normal in size. Event monitor from 03/2016 showed NSR with rare short episodes of SVT with a minimum heart rate of 47 bpm (4 AM) and an average heart rate of 70 bpm. 12 SVT runs occurred with the fastest interval lasting 6 beats with a maximum rate of 207 bpm, the longest run lasting 19 beats with an average rate of 123 bpm, rare PVCs, short run of ventricular trigeminy was noted. He was last seen by Dr. Mariah MillingGollan in 06/2017 and was maintained on flecainide 50 mg bid as well as Toprol XL.   Labs: 06/2018 - A1c 5.4, HGB 15.3, SCr 1.17, K+ 4.7, AST/ALT normal, LDL 42  He comes in doing well today. He reports his wife and friends have noted some SOB if the patient has to walk for a distance that typically improves if he continues on with his walk. He states, "it's like my body needs to catch up." He denies any chest pain, palpitations, dizziness, presyncope, or syncope. Symptoms do not feel similar to presentation leading up to his valvuloplasty in 2006. No lower extremity swelling, abdominal distension, orthopnea, PND, or early satiety. No falls, BRBPR or melena. Blood pressure remains well controlled.    Past Medical  History:  Diagnosis Date  . Allergic rhinitis, cause unspecified   . Allergy   . Diverticulosis of colon (without mention of hemorrhage)   . Esophageal reflux   . Measles without mention of complication   . Mumps without mention of complication   . 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 Paroxysmal SVT (supraventricular tachycardia) (HCC)     Past Surgical History:  Procedure Laterality Date  . CARDIAC CATHETERIZATION  2006   [facility]   . FOOT SURGERY  03/2017   right great toe Dr.Hyatt   . GREEN LIGHT LASER TURP (TRANSURETHRAL RESECTION OF PROSTATE N/A 01/15/2018   Procedure: GREEN LIGHT LASER TURP (TRANSURETHRAL RESECTION OF PROSTATE;  Surgeon: Orson ApeWolff, Michael R, MD;  Location: [facility] ORS;  Service: Urology;  Laterality: N/A;  . HERNIA REPAIR    . MITRAL VALVE REPAIR  2006  . rotator cuff repair    . SHOULDER ARTHROSCOPY Left 08/24/2016   Procedure: ARTHROSCOPY SHOULDER with debridement;  Surgeon: Juanell FairlyKevin Krasinski, MD;  Location: [facility] ORS;  Service: Orthopedics;  Laterality: Left;  . TONSILLECTOMY      Current Meds  Medication Sig  . esomeprazole (NEXIUM) 20 MG capsule Take 1 capsule (20 mg total) by mouth daily before breakfast.  . flecainide (TAMBOCOR) 50 MG tablet Take 1 tablet (50 mg total) by mouth 2 (two) times daily.  . fluticasone (FLONASE) 50 MCG/ACT nasal spray PLACE 2  SPRAYS INTO BOTH NOSTRILS DAILY  . levocetirizine (XYZAL) 5 MG tablet Take 1 tablet (5 mg total) by mouth every evening.  . meloxicam (MOBIC) 15 MG tablet Take 1 tablet (15 mg total) by mouth daily as needed for pain. Up to 1-2 weeks max then reduce  . metoprolol succinate (TOPROL-XL) 25 MG 24 hr tablet Take 0.5 tablets (12.5 mg total) by mouth daily.  . Multiple Vitamins-Minerals (MULTIVITAMIN WITH MINERALS) tablet Take 1 tablet by mouth daily.  Marland Kitchen OVER THE COUNTER MEDICATION Place 20 drops under the tongue 2 (two) times daily. CBD Oil  . rosuvastatin (CRESTOR) 5 MG  tablet Take 1 tablet (5 mg total) by mouth every other day.  . Turmeric 500 MG CAPS Take 500 mg by mouth 2 (two) times daily.   . [DISCONTINUED] aspirin 81 MG tablet Take 81 mg by mouth daily.  . [DISCONTINUED] flecainide (TAMBOCOR) 50 MG tablet TAKE 1 TABLET BY MOUTH 2 TIMES DAILY.  . [DISCONTINUED] metoprolol succinate (TOPROL-XL) 25 MG 24 hr tablet Take 1 tablet (25 mg total) by mouth daily. (Patient taking differently: Take 25 mg by mouth every evening. )  . [DISCONTINUED] rosuvastatin (CRESTOR) 5 MG tablet TAKE 1 TABLET BY MOUTH EVERY OTHER DAY.    Allergies:   Peach [prunus persica]   Social History:  The patient  reports that he has never smoked. He has never used smokeless tobacco. He reports that he does not drink alcohol or use drugs.   Family History:  The patient's family history includes Cancer in his maternal uncle; Dementia in his father; Emphysema in his father; Heart disease in his mother; Hypertension in his mother.  ROS:   Review of Systems  Constitutional: Positive for malaise/fatigue. Negative for chills, diaphoresis, fever and weight loss.  HENT: Negative for congestion.   Eyes: Negative for discharge and redness.  Respiratory: Positive for shortness of breath. Negative for cough, hemoptysis, sputum production and wheezing.        SOB has been pointed out by others, no subjective SOB by the patient  Cardiovascular: Negative for chest pain, palpitations, orthopnea, claudication, leg swelling and PND.  Gastrointestinal: Negative for abdominal pain, blood in stool, heartburn, melena, nausea and vomiting.  Genitourinary: Negative for hematuria.  Musculoskeletal: Negative for falls and myalgias.  Skin: Negative for rash.  Neurological: Positive for weakness. Negative for dizziness, tingling, tremors, sensory change, speech change, focal weakness and loss of consciousness.  Endo/Heme/Allergies: Does not bruise/bleed easily.  Psychiatric/Behavioral: Negative for substance  abuse. The patient is not nervous/anxious.      PHYSICAL EXAM:  VS:  BP 128/68 (BP Location: Left Arm, Patient Position: Sitting, Cuff Size: Normal)   Pulse 61   Ht 5\' 10"  (1.778 m)   Wt 193 lb 1.9 oz (87.6 kg)   BMI 27.71 kg/m  BMI: Body mass index is 27.71 kg/m.  Physical Exam  Constitutional: He is oriented to person, place, and time. He appears well-developed and well-nourished.  HENT:  Head: Normocephalic and atraumatic.  Eyes: Right eye exhibits no discharge. Left eye exhibits no discharge.  Neck: Normal range of motion. No JVD present.  Cardiovascular: Normal rate, regular rhythm, S1 normal and S2 normal. Exam reveals no distant heart sounds, no friction rub, no midsystolic click and no opening snap.  Murmur heard. High-pitched blowing holosystolic murmur is present with a grade of 1/6 at the apex. Pulses:      Posterior tibial pulses are 2+ on the right side and 2+ on the left side.  Pulmonary/Chest: Effort normal and breath sounds normal. No respiratory distress. He has no decreased breath sounds. He has no wheezes. He has no rales. He exhibits no tenderness.  Abdominal: Soft. He exhibits no distension. There is no abdominal tenderness.  Musculoskeletal:        General: No edema.  Neurological: He is alert and oriented to person, place, and time.  Skin: Skin is warm and dry. No cyanosis. Nails show no clubbing.  Psychiatric: He has a normal mood and affect. His speech is normal and behavior is normal. Judgment and thought content normal.     EKG:  Was ordered and interpreted by me today. Shows NSR, 61 bpm, no acute st/t changes   Recent Labs: 07/10/2018: ALT 27; BUN 22; Creat 1.17; Hemoglobin 15.3; Platelets 360; Potassium 4.7; Sodium 142  07/10/2018: Cholesterol 115; HDL 58; LDL Cholesterol (Calc) 42; Total CHOL/HDL Ratio 2.0; Triglycerides 73   CrCl cannot be calculated (Patient's most recent lab result is older than the maximum 21 days allowed.).   Wt Readings from  Last 3 Encounters:  09/25/18 193 lb 1.9 oz (87.6 kg)  07/17/18 193 lb (87.5 kg)  01/15/18 185 lb (83.9 kg)     Other studies reviewed: Additional studies/records reviewed today include: summarized above  ASSESSMENT AND PLAN:  1. SOB: This has been pointed out to the patient by his wife or someone on the phone if he walks to it. Subjectively, the patient has not noted SOB. Schedule echo given prior valvuloplasty and Lexiscan Myoview to evaluate for high risk ischemia. No symptoms of palpitations at this time, therefore we will defer repeat cardiac monitoring at this time pending the above workup.   2. HFpEF: He appears euvolemic and well compensated. Check echo as above. Not currently needing a standing diuretic.   3. Mitral valve disease: Status post valvuloplasty in 2006. Check echo as above.   4. Paroxysmal SVT: No symptoms of palpitations. Continue flecainide and Toprol XL.   5. HLD: Tolerating Crestor 5 mg every other day with an LDL of 42 from 06/2018.   Disposition: F/u with Dr. Mariah Milling or an APP in 6 months, sooner if needed.  Current medicines are reviewed at length with the patient today.  The patient did not have any concerns regarding medicines.  Signed, Eula Listen, PA-C 09/25/2018 4:15 PM     [facility] [address] [place], [place] [address] [phone]

## 2018-09-25 ENCOUNTER — Encounter: Payer: Self-pay | Admitting: Physician Assistant

## 2018-09-25 ENCOUNTER — Other Ambulatory Visit: Payer: Self-pay

## 2018-09-25 ENCOUNTER — Ambulatory Visit: Payer: 59 | Admitting: Physician Assistant

## 2018-09-25 VITALS — BP 128/68 | HR 61 | Ht 70.0 in | Wt 193.1 lb

## 2018-09-25 DIAGNOSIS — R0602 Shortness of breath: Secondary | ICD-10-CM

## 2018-09-25 DIAGNOSIS — Z9889 Other specified postprocedural states: Secondary | ICD-10-CM | POA: Diagnosis not present

## 2018-09-25 DIAGNOSIS — I5032 Chronic diastolic (congestive) heart failure: Secondary | ICD-10-CM | POA: Diagnosis not present

## 2018-09-25 DIAGNOSIS — I059 Rheumatic mitral valve disease, unspecified: Secondary | ICD-10-CM | POA: Diagnosis not present

## 2018-09-25 DIAGNOSIS — E782 Mixed hyperlipidemia: Secondary | ICD-10-CM | POA: Diagnosis not present

## 2018-09-25 DIAGNOSIS — I471 Supraventricular tachycardia: Secondary | ICD-10-CM

## 2018-09-25 MED ORDER — ROSUVASTATIN CALCIUM 5 MG PO TABS: 5.0000 mg | ORAL_TABLET | ORAL | 3 refills | Status: DC

## 2018-09-25 MED ORDER — ASPIRIN EC 81 MG PO TBEC: 81.0000 mg | DELAYED_RELEASE_TABLET | Freq: Every day | ORAL | 3 refills | Status: AC

## 2018-09-25 MED ORDER — FLECAINIDE ACETATE 50 MG PO TABS: 50.0000 mg | ORAL_TABLET | Freq: Two times a day (BID) | ORAL | 3 refills | Status: DC

## 2018-09-25 MED ORDER — METOPROLOL SUCCINATE ER 25 MG PO TB24: 12.5000 mg | ORAL_TABLET | Freq: Every day | ORAL | 3 refills | Status: DC

## 2018-09-25 NOTE — Patient Instructions (Signed)
Medication Instructions:  Your physician recommends that you continue on your current medications as directed. Please refer to the Current Medication list given to you today.  If you need a refill on your cardiac medications before your next appointment, please call your pharmacy.   Lab work: None ordered  If you have labs (blood work) drawn today and your tests are completely normal, you will receive your results only by: Marland Kitchen. MyChart Message (if you have MyChart) OR . A paper copy in the mail If you have any lab test that is abnormal or we need to change your treatment, we will call you to review the results.  Testing/Procedures: 1- [facility] MYOVIEW  Your caregiver has ordered a Stress Test with nuclear imaging. The purpose of this test is to evaluate the blood supply to your heart muscle. This procedure is referred to as a "Non-Invasive Stress Test." This is because other than having an IV started in your vein, nothing is inserted or "invades" your body. Cardiac stress tests are done to find areas of poor blood flow to the heart by determining the extent of coronary artery disease (CAD). Some patients exercise on a treadmill, which naturally increases the blood flow to your heart, while others who are  unable to walk on a treadmill due to physical limitations have a pharmacologic/chemical stress agent called Lexiscan . This medicine will mimic walking on a treadmill by temporarily increasing your coronary blood flow.   Please note: these test may take anywhere between 2-4 hours to complete  PLEASE REPORT TO [facility]  THE VOLUNTEERS AT THE FIRST DESK WILL DIRECT YOU WHERE TO GO  Date of Procedure:_____________________________________  Arrival Time for Procedure:______________________________  Instructions regarding medication:   __x__:  Hold betablocker(s) night before procedure and morning of procedure (Toprol)  PLEASE NOTIFY THE OFFICE AT LEAST 24 HOURS IN ADVANCE IF YOU  ARE UNABLE TO KEEP YOUR APPOINTMENT.  [phone] AND  PLEASE NOTIFY N[facility]LEAR MEDICINE AT [facility] AT LEAST 24 HOURS IN ADVANCE IF YOU ARE UNABLE TO KEEP YOUR APPOINTMENT. [phone]  How to prepare for your Myoview test:  1. Do not eat or drink after midnight 2. No caffeine for 24 hours prior to test 3. No smoking 24 hours prior to test. 4. Your medication may be taken with water.  If your doctor stopped a medication because of this test, do not take that medication. 5. Skirts or pants are appropriate. Please wear a short sleeve shirt. 6. No perfume, cologne or lotion. 7. Wear comfortable walking shoes.   2- Echo  [facility] on ______________ at _______________ AM/PM for an Echocardiogram. Your physician has requested that you have an echocardiogram. Echocardiography is a painless test that uses sound waves to create images of your heart. It provides your doctor with information about the size and shape of your heart and how well your heart's chambers and valves are working. This procedure takes approximately one hour. There are no restrictions for this procedure. Please note; depending on visual quality an IV may need to be placed.   Follow-Up: At [facility], you and your health needs are our priority.  As part of our continuing mission to provide you with exceptional heart care, we have created designated Provider Care Teams.  These Care Teams include your primary Cardiologist (physician) and Advanced Practice Providers (APPs -  Physician Assistants and Nurse Practitioners) who all work together to provide you with the care you need, when you need  it. You will need a follow up virtual appointment in 6 weeks. Please see  Dr. Mariah Milling.

## 2018-10-03 ENCOUNTER — Encounter
Admission: RE | Admit: 2018-10-03 | Discharge: 2018-10-03 | Disposition: A | Discharge status: Home / Self Care | Payer: 59 | Source: Ambulatory Visit | Attending: Physician Assistant | Admitting: Physician Assistant

## 2018-10-03 ENCOUNTER — Other Ambulatory Visit: Payer: Self-pay

## 2018-10-03 DIAGNOSIS — R0602 Shortness of breath: Secondary | ICD-10-CM | POA: Diagnosis not present

## 2018-10-03 LAB — NM MYOCAR MULTI W/SPECT W/WALL MOTION / EF
LV dias vol: 118 mL (ref 62–150)
LV sys vol: 47 mL
Peak HR: 88 {beats}/min
Rest HR: 61 {beats}/min
SDS: 4
SRS: 1
SSS: 0
TID: 0.95

## 2018-10-03 MED ORDER — TECHNETIUM TC 99M TETROFOSMIN IV KIT
32.8900 | PACK | Freq: Once | INTRAVENOUS | Status: AC | PRN
  Administered 2018-10-03: 32.89 via INTRAVENOUS

## 2018-10-03 MED ORDER — TECHNETIUM TC 99M TETROFOSMIN IV KIT
10.0000 | PACK | Freq: Once | INTRAVENOUS | Status: AC | PRN
  Administered 2018-10-03: 10.429 via INTRAVENOUS

## 2018-10-03 MED ORDER — REGADENOSON 0.4 MG/5ML IV SOLN
0.4000 mg | Freq: Once | INTRAVENOUS | Status: AC
  Administered 2018-10-03: 0.4 mg via INTRAVENOUS

## 2018-10-08 ENCOUNTER — Other Ambulatory Visit: Payer: Self-pay

## 2018-10-08 ENCOUNTER — Ambulatory Visit (INDEPENDENT_AMBULATORY_CARE_PROVIDER_SITE_OTHER): Payer: 59

## 2018-10-08 DIAGNOSIS — Z23 Encounter for immunization: Secondary | ICD-10-CM | POA: Diagnosis not present

## 2018-10-08 NOTE — Patient Instructions (Signed)
Charge drop for vaccine.

## 2018-10-16 ENCOUNTER — Telehealth: Payer: Self-pay | Admitting: Cardiovascular Disease

## 2018-10-16 NOTE — Telephone Encounter (Signed)
COVID-19 Pre-Screening Questions:  In the past 7 to 10 days have you had a cough, shortness of breath, headache, congestion, fever (100 or greater) body aches, chills, sore throat, or sudden loss of taste or sense of smell?NO  Have you been around anyone with known Covid 19. NO Have you been around anyone who is awaiting Covid 19 test results in the past 7 to 10 days? NO  Have you been around anyone who has been exposed to Covid 19, or has mentioned symptoms of Covid 19 within the past 7 to 10 days? NO  If you have any concerns/questions about symptoms patients report during screening (either on the phone or at threshold). Contact the provider seeing the patient or DOD for further guidance. If neither are available contact a member of the leadership team.  Patient confirmed appt date and time

## 2018-10-17 ENCOUNTER — Other Ambulatory Visit: Payer: Self-pay

## 2018-10-17 ENCOUNTER — Ambulatory Visit (INDEPENDENT_AMBULATORY_CARE_PROVIDER_SITE_OTHER): Payer: 59

## 2018-10-17 DIAGNOSIS — R0602 Shortness of breath: Secondary | ICD-10-CM

## 2018-11-09 NOTE — Progress Notes (Signed)
Cardiology Office Note  Date:  11/11/2018   ID:  Austin MarrowRobert D Stewart, DOB 01/22/1956, MRN [id number]  PCP:  Smitty CordsKaramalegos, Austin J, DO   Chief Complaint  Patient presents with  . other    6 week follow up. Patient c/o Chest pain and SOB.  Meds reviewed verbally with patient.     HPI:  [name] is a 63 year old gentleman, history of  significant mitral valve regurgitation (torn leaflet? by his report) with associated shortness of breath,  mitral valve repair in [facility] at [facility] with maze procedure  shoulder surgery March 2013 canceled secondary to arrhythmia, likely PACs with followup echocardiogram and stress test showing no ischemia by report, shoulder surgery April 2013   taking flecainide presumably for ectopy. This was started by prior cardiologist. who presents for routine followup of his arrhythmia  In follow-up today he reports having continued and worsening shortness of breath Works in [employer], works [phone] steps per day  For his shortness of breath had recent echocardiogram and stress test  Seen 09/25/2018 in clinic noted some SOB if the patient has to walk for a distance that typically improves if he continues on with his walk. He states, "it's like my body needs to catch up."   EKG in 08/2018: nsr rate 60s  Low risk stress test 10/03/2018 Testing results reviewed with him in detail  Echo 10/17/2018 Images were pulled up in the office, mitral valve regurgitation discussed with him, images reviewed  1. The left ventricle has normal systolic function with an ejection fraction of 60-65%.   2. The right ventricle normal systolic function.  Right ventricular systolic pressure 34.4 mmHg.  3.  Annuloplasty valve is present in the mitral position. Procedure Date: 2006.  4.  Mitral valve regurgitation is moderate  5. Mildly dilated Aortic root, 3.8 cm  No regular exercise program Weight high, stable  Total 138, LDL 71 HBa1C 5.2  Other past medical history  reviewed CT scan March 2017 Coronary calcium score of 130. This was 3864 percentile for age and sex matched control.  Event monitor reviewed with him showing rare episodes of SVT ranging from 4 beats up to 19 beats, 12 episodes in total over 2 week.  Father passed away earlier in 2017, lived in [place]  echocardiogram in 2013 which showed well functioning mitral valve.  Holter monitor in 2013. He does not know what that showed.   cholesterol has not been a problem for the patient. No significant family history of coronary artery disease. Nonsmoker. no coronary artery disease seen on previous cardiac catheterization in 2006 He was started on flecainide 50 mg twice a day for ectopy in 2013.  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  PSH:    Past Surgical History:  Procedure Laterality Date  . CARDIAC CATHETERIZATION  2006   [facility]   . FOOT SURGERY  03/2017   right great toe Dr.Hyatt   . GREEN LIGHT LASER TURP (TRANSURETHRAL RESECTION OF PROSTATE N/A 01/15/2018   Procedure: GREEN LIGHT LASER TURP (TRANSURETHRAL RESECTION OF PROSTATE;  Surgeon: Orson ApeWolff, Michael R, MD;  Location: [facility] ORS;  Service: Urology;  Laterality: N/A;  . HERNIA REPAIR    . MITRAL VALVE REPAIR  2006  . rotator cuff repair    .  SHOULDER ARTHROSCOPY Left 08/24/2016   Procedure: ARTHROSCOPY SHOULDER with debridement;  Surgeon: Juanell FairlyKevin Krasinski, MD;  Location: [facility] ORS;  Service: Orthopedics;  Laterality: Left;  . TONSILLECTOMY      Current Outpatient Medications  Medication Sig Dispense Refill  . aspirin EC 81 MG tablet Take 1 tablet (81 mg total) by mouth daily. 90 tablet 3  . esomeprazole (NEXIUM) 20 MG  capsule Take 1 capsule (20 mg total) by mouth daily before breakfast. 90 capsule 3  . flecainide (TAMBOCOR) 50 MG tablet Take 1 tablet (50 mg total) by mouth 2 (two) times daily. 180 tablet 3  . fluticasone (FLONASE) 50 MCG/ACT nasal spray PLACE 2 SPRAYS INTO BOTH NOSTRILS DAILY 48 g 3  . levocetirizine (XYZAL) 5 MG tablet Take 1 tablet (5 mg total) by mouth every evening. 90 tablet 3  . meloxicam (MOBIC) 15 MG tablet Take 1 tablet (15 mg total) by mouth daily as needed for pain. Up to 1-2 weeks max then reduce 30 tablet 3  . metoprolol succinate (TOPROL-XL) 25 MG 24 hr tablet Take 0.5 tablets (12.5 mg total) by mouth daily. 45 tablet 3  . Multiple Vitamins-Minerals (MULTIVITAMIN WITH MINERALS) tablet Take 1 tablet by mouth daily.    Marland Kitchen. OVER THE COUNTER MEDICATION Place 20 drops under the tongue 2 (two) times daily. CBD Oil    . rosuvastatin (CRESTOR) 5 MG tablet Take 1 tablet (5 mg total) by mouth every other day. 45 tablet 3  . Turmeric 500 MG CAPS Take 500 mg by mouth 2 (two) times daily.     Marland Kitchen. Hyoscyamine Sulfate SL (LEVSIN/SL) 0.125 MG SUBL Place 0.125 mg under the tongue every 4 (four) hours as needed. 1-2 TABS (Patient not taking: Reported on 09/25/2018) 30 each 2   No current facility-administered medications for this visit.      Allergies:   Peach [prunus persica]   Social History:  The patient  reports that he has never smoked. He has never used smokeless tobacco. He reports that he does not drink alcohol or use drugs.   Family History:   family history includes Cancer in his maternal uncle; Dementia in his father; Emphysema in his father; Heart disease in his mother; Hypertension in his mother.   Review of Systems: Review of Systems  Constitutional: Negative.   HENT: Negative.   Respiratory: Positive for shortness of breath.   Cardiovascular: Negative.   Gastrointestinal: Negative.   Musculoskeletal: Negative.   Neurological: Negative.   Psychiatric/Behavioral: Negative.    All other systems reviewed and are negative.   PHYSICAL EXAM: VS:  BP 134/80 (BP Location: Left Arm, Patient Position: Sitting, Cuff Size: Normal)   Pulse 64   Ht 5\' 10"  (1.778 m)   Wt 190 lb (86.2 kg)   SpO2 98%   BMI 27.26 kg/m  , BMI Body mass index is 27.26 kg/m. Constitutional:  oriented to person, place, and time. No distress.  HENT:  Head: Grossly normal Eyes:  no discharge. No scleral icterus.  Neck: No JVD, no carotid bruits  Cardiovascular: Regular rate and rhythm, no murmurs appreciated Pulmonary/Chest: Clear to auscultation bilaterally, no wheezes or rails Abdominal: Soft.  no distension.  no tenderness.  Musculoskeletal: Normal range of motion Neurological:  normal muscle tone. Coordination normal. No atrophy Skin: Skin warm and dry Psychiatric: normal affect, pleasant  Recent Labs: 07/10/2018: ALT 27; BUN 22; Creat 1.17; Hemoglobin 15.3; Platelets 360; Potassium 4.7; Sodium 142    Lipid Panel Lab Results  Component Value Date  CHOL 115 07/10/2018   HDL 58 07/10/2018   LDLCALC 42 07/10/2018   TRIG 73 07/10/2018      Wt Readings from Last 3 Encounters:  11/11/18 190 lb (86.2 kg)  09/25/18 193 lb 1.9 oz (87.6 kg)  07/17/18 193 lb (87.5 kg)     ASSESSMENT AND PLAN:  Diastolic congestive heart failure, unspecified congestive heart failure chronicity ([place]) - Plan: EKG 12-Lead Appears euvolemic on today's visit,  Likely not a contributor to his shortness of breath Essentially normal right heart pressures on echocardiogram  H/O mitral valve repair - Plan: EKG 12-Lead Moderate mitral valve regurgitation on recent echocardiogram This could be contributing to some of his shortness of breath Also mild aortic valve regurgitation, TR, discussed with him  SVT/Heart palpitations - Plan: EKG 12-Lead Taking  flecainide with good symptom relief Denies having significant symptoms, continue metoprolol  Elevated lipids Cholesterol is at goal on the current  lipid regimen. No changes to the medications were made.  Prediabetes  Hemoglobin A1c well controlled We have encouraged continued exercise, careful diet management in an effort to lose weight.  Shortness of breath Much of today's visit spent discussing his shortness of breath symptoms Suspect secondary to conditioning, high weight Suggested some gentle weight loss, working on his conditioning Also likely a contribution from his moderate mitral valve regurgitation   Total encounter time more than 25 minutes  Greater than 50% was spent in counseling and coordination of care with the patient   Disposition:   F/U  12 months   No orders of the defined types were placed in this encounter.    Signed, Esmond Plants, M.D., Ph.D. 11/11/2018  [place], [place]

## 2018-11-11 ENCOUNTER — Encounter: Payer: Self-pay | Admitting: Cardiovascular Disease

## 2018-11-11 ENCOUNTER — Other Ambulatory Visit: Payer: Self-pay

## 2018-11-11 ENCOUNTER — Ambulatory Visit: Payer: 59 | Admitting: Cardiovascular Disease

## 2018-11-11 VITALS — BP 134/80 | HR 64 | Ht 70.0 in | Wt 190.0 lb

## 2018-11-11 DIAGNOSIS — I471 Supraventricular tachycardia, unspecified: Secondary | ICD-10-CM

## 2018-11-11 DIAGNOSIS — R0602 Shortness of breath: Secondary | ICD-10-CM

## 2018-11-11 DIAGNOSIS — Z9889 Other specified postprocedural states: Secondary | ICD-10-CM

## 2018-11-11 DIAGNOSIS — E782 Mixed hyperlipidemia: Secondary | ICD-10-CM | POA: Diagnosis not present

## 2018-11-11 DIAGNOSIS — I5032 Chronic diastolic (congestive) heart failure: Secondary | ICD-10-CM

## 2018-11-11 DIAGNOSIS — R7303 Prediabetes: Secondary | ICD-10-CM

## 2018-11-11 DIAGNOSIS — I059 Rheumatic mitral valve disease, unspecified: Secondary | ICD-10-CM | POA: Diagnosis not present

## 2018-11-11 NOTE — Patient Instructions (Signed)

## 2018-12-12 ENCOUNTER — Other Ambulatory Visit: Payer: Self-pay | Admitting: Family Medicine

## 2018-12-12 ENCOUNTER — Other Ambulatory Visit: Payer: Self-pay | Admitting: *Deleted

## 2018-12-12 DIAGNOSIS — M159 Polyosteoarthritis, unspecified: Secondary | ICD-10-CM

## 2018-12-12 DIAGNOSIS — K219 Gastro-esophageal reflux disease without esophagitis: Secondary | ICD-10-CM

## 2018-12-12 DIAGNOSIS — J302 Other seasonal allergic rhinitis: Secondary | ICD-10-CM

## 2018-12-12 DIAGNOSIS — M150 Primary generalized (osteo)arthritis: Secondary | ICD-10-CM

## 2018-12-12 MED ORDER — METOPROLOL SUCCINATE ER 25 MG PO TB24: 12.5000 mg | ORAL_TABLET | Freq: Every day | ORAL | 3 refills | Status: DC

## 2018-12-12 MED ORDER — ROSUVASTATIN CALCIUM 5 MG PO TABS: 5.0000 mg | ORAL_TABLET | ORAL | 3 refills | Status: DC

## 2018-12-12 MED ORDER — MELOXICAM 15 MG PO TABS: 15.0000 mg | ORAL_TABLET | Freq: Every day | ORAL | 0 refills | Status: DC | PRN

## 2018-12-12 MED ORDER — LEVOCETIRIZINE DIHYDROCHLORIDE 5 MG PO TABS: 5.0000 mg | ORAL_TABLET | Freq: Every evening | ORAL | 0 refills | Status: DC

## 2018-12-12 MED ORDER — FLECAINIDE ACETATE 50 MG PO TABS: 50.0000 mg | ORAL_TABLET | Freq: Two times a day (BID) | ORAL | 3 refills | Status: DC

## 2018-12-12 MED ORDER — ESOMEPRAZOLE MAGNESIUM 20 MG PO CPDR: 20.0000 mg | DELAYED_RELEASE_CAPSULE | Freq: Every day | ORAL | 1 refills | Status: DC

## 2019-01-28 ENCOUNTER — Encounter: Payer: Self-pay | Admitting: Family Medicine

## 2019-01-28 ENCOUNTER — Ambulatory Visit (INDEPENDENT_AMBULATORY_CARE_PROVIDER_SITE_OTHER): Payer: Medicare HMO | Admitting: Family Medicine

## 2019-01-28 ENCOUNTER — Other Ambulatory Visit: Payer: Self-pay

## 2019-01-28 DIAGNOSIS — G8929 Other chronic pain: Secondary | ICD-10-CM

## 2019-01-28 DIAGNOSIS — M150 Primary generalized (osteo)arthritis: Secondary | ICD-10-CM | POA: Diagnosis not present

## 2019-01-28 DIAGNOSIS — M25552 Pain in left hip: Secondary | ICD-10-CM | POA: Diagnosis not present

## 2019-01-28 DIAGNOSIS — M159 Polyosteoarthritis, unspecified: Secondary | ICD-10-CM

## 2019-01-28 MED ORDER — BACLOFEN 10 MG PO TABS: 5.0000 mg | ORAL_TABLET | Freq: Every evening | ORAL | 1 refills | Status: DC | PRN

## 2019-01-28 NOTE — Progress Notes (Signed)
Virtual Visit via Telephone The purpose of this virtual visit is to provide medical care while limiting exposure to the novel coronavirus (COVID19) for both patient and office staff.  Consent was obtained for phone visit:  Yes.   Answered questions that patient had about telehealth interaction:  Yes.   I discussed the limitations, risks, security and privacy concerns of performing an evaluation and management service by telephone. I also discussed with the patient that there may be a patient responsible charge related to this service. The patient expressed understanding and agreed to proceed.  Patient Location: Home Provider Location: [facility])  ---------------------------------------------------------------------- Chief Complaint  Patient presents with  . Hip Pain    S: Reviewed CMA documentation. I have called patient and gathered additional HPI as follows:  Osteoarthritis, LEFT HIP PAIN Chronic See prior note for background. Has seen [facility] - 3-4 years ago, [place] bilateral bone on bone, candidate for surgery - long recovery he is trying to avoid this Left hip pain, gradual worsening, past 2-3 weeks worsening, rolling on hip can wake him up due to pain - Previously 06/2018 was on meloxicam - In past had rotator cuff surgery, had arthritis in multiple joints - Now worsening L hip pain chronic issue. Walking up steps worse, even walking longer distance will be worse at evening - in past he took baclofen PRN at night, usually did okay during day - Not taken other meds before such as Gabapentin or Tramadol. - He has done PT before at [facility] request this - No hip imaging  Denies any high risk travel to areas of current concern for COVID19. Denies any known or suspected exposure to person with or possibly with COVID19.  Denies any fevers, chills, sweats, body ache, cough, shortness of breath, sinus pain or pressure, headache, abdominal pain, diarrhea  Denies  fall or injury  Past Medical History:  Diagnosis Date  . Allergic rhinitis, cause unspecified   . Allergy   . Diverticulosis of colon (without mention of hemorrhage)   . Esophageal reflux   . Measles without mention of complication   . Mumps without mention of complication   . 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 Paroxysmal SVT (supraventricular tachycardia) (HCC)    Social History   Tobacco Use  . Smoking status: Never Smoker  . Smokeless tobacco: Never Used  Substance Use Topics  . Alcohol use: No  . Drug use: No    Current Outpatient Medications:  .  aspirin EC 81 MG tablet, Take 1 tablet (81 mg total) by mouth daily., Disp: 90 tablet, Rfl: 3 .  esomeprazole (NEXIUM) 20 MG capsule, Take 1 capsule (20 mg total) by mouth daily before breakfast., Disp: 90 capsule, Rfl: 1 .  flecainide (TAMBOCOR) 50 MG tablet, Take 1 tablet (50 mg total) by mouth 2 (two) times daily., Disp: 180 tablet, Rfl: 3 .  fluticasone (FLONASE) 50 MCG/ACT nasal spray, PLACE 2 SPRAYS INTO BOTH NOSTRILS DAILY, Disp: 48 g, Rfl: 3 .  Hyoscyamine Sulfate SL (LEVSIN/SL) 0.125 MG SUBL, Place 0.125 mg under the tongue every 4 (four) hours as needed. 1-2 TABS, Disp: 30 each, Rfl: 2 .  levocetirizine (XYZAL) 5 MG tablet, Take 1 tablet (5 mg total) by mouth every evening., Disp: 90 tablet, Rfl: 0 .  meloxicam (MOBIC) 15 MG tablet, Take 1 tablet (15 mg total) by mouth daily as needed for pain. Up to 1-2 weeks max then reduce, Disp: 90  tablet, Rfl: 0 .  metoprolol succinate (TOPROL-XL) 25 MG 24 hr tablet, Take 0.5 tablets (12.5 mg total) by mouth daily., Disp: 45 tablet, Rfl: 3 .  Multiple Vitamins-Minerals (MULTIVITAMIN WITH MINERALS) tablet, Take 1 tablet by mouth daily., Disp: , Rfl:  .  OVER THE COUNTER MEDICATION, Place 20 drops under the tongue 2 (two) times daily. CBD Oil, Disp: , Rfl:  .  rosuvastatin (CRESTOR) 5 MG tablet, Take 1 tablet (5 mg total) by mouth every other  day., Disp: 45 tablet, Rfl: 3 .  Turmeric 500 MG CAPS, Take 500 mg by mouth 2 (two) times daily. , Disp: , Rfl:  .  baclofen (LIORESAL) 10 MG tablet, Take 0.5-1 tablets (5-10 mg total) by mouth at bedtime as needed for muscle spasms., Disp: 30 each, Rfl: 1  Depression screen [facility] 2/9 01/28/2019 07/17/2018 06/14/2016  Decreased Interest 0 0 0  Down, Depressed, Hopeless 0 0 0  PHQ - 2 Score 0 0 0  Some encounter information is confidential and restricted. Go to Review Flowsheets activity to see all data.    No flowsheet data found.  -------------------------------------------------------------------------- O: No physical exam performed due to remote telephone encounter.  Lab results reviewed.  No results found for this or any previous visit (from the past 2160 hour(s)).  -------------------------------------------------------------------------- A&P:  Problem List Items Addressed This Visit    Chronic left hip pain - Primary   Relevant Medications   baclofen (LIORESAL) 10 MG tablet   Other Relevant Orders   Ambulatory referral to Physical Therapy   DG HIP UNILAT W OR W/O PELVIS 2-3 VIEWS LEFT   Primary osteoarthritis involving multiple joints   Relevant Medications   baclofen (LIORESAL) 10 MG tablet   Other Relevant Orders   Ambulatory referral to Physical Therapy   DG HIP UNILAT W OR W/O PELVIS 2-3 VIEWS LEFT     Subacute on chronic gradual worsening problem with L Hip pain, likely osteoarthritis underlying given multiple joints, symptoms - No radiation of pain or radicular symptoms - Inadequate conservative therapy   Plan: 1. Continue existing meloxicam 15mg  daily PRN - not take regularly use as advised intermittent dosing 2. Start muscle relaxant with Baclofen 10mg  tabs - take 5-10mg  up to BID PRN, titrate up as tolerated 3. May use Tylenol PRN for breakthrough 4. Encouraged use of heating pad 1-2x daily for now then PRN. 5. L Hip X-ray ordered [facility] - future review  results 6. [facility] Ambulatory PT ordered 7. Follow-up 4-6 weeks if not improving, consider hip x-rays, future troch bursa steroid injection, may follow-up with already established Ortho if needed    Meds ordered this encounter  Medications  . baclofen (LIORESAL) 10 MG tablet    Sig: Take 0.5-1 tablets (5-10 mg total) by mouth at bedtime as needed for muscle spasms.    Dispense:  30 each    Refill:  1    Follow-up: - Return in 4 weeks if needed  Patient verbalizes understanding with the above medical recommendations including the limitation of remote medical advice.  Specific follow-up and call-back criteria were given for patient to follow-up or seek medical care more urgently if needed.  - Time spent in direct consultation with patient on phone: 13 minutes  , DO [facility] 01/28/2019, 4:34 PM

## 2019-01-28 NOTE — Patient Instructions (Addendum)
For hip pain Likely arthritis underlying  Start taking Baclofen (Lioresal) 10mg  (muscle relaxant) - start with half (cut) to one whole pill at night as needed for next 1-3 nights (may make you drowsy, caution with driving) see how it affects you, then if tolerated increase to one pill 2 to 3 times a day or (every 8 hours as needed) - prefer night time only  May continue Meloxicam as you are taking it.  Recommend to start taking Tylenol Extra Strength 500mg  tabs - take 1 to 2 tabs per dose (max 1000mg ) every 6-8 hours for pain (take regularly, don't skip a dose for next 7 days), max 24 hour daily dose is 6 tablets or 3000mg . In the future you can repeat the same everyday Tylenol course for 1-2 weeks at a time.   Referral to [facility] - stay tuned for apt.  Ordered Left Hip X-ray   [facility] Address: [address] [place], [place] [address] Phone: [phone] First come first serve - no apt needed, walk in for x-rays - stay tuned for results can be delayed as I will be out of office rest of this week and part of next week.  If need in future can do hip injection and or refer to Orthopedic  Please schedule a Follow-up Appointment to: Return in about 4 weeks (around 02/25/2019), or if symptoms worsen or fail to improve, for hip pain.  If you have any other questions or concerns, please feel free to call the office or send a message through [place]. You may also schedule an earlier appointment if necessary.  Additionally, you may be receiving a survey about your experience at our office within a few days to 1 week by e-mail or mail. We value your feedback.  Nobie Putnam, DO [facility]

## 2019-01-29 ENCOUNTER — Encounter: Payer: Self-pay | Admitting: Family Medicine

## 2019-01-30 ENCOUNTER — Ambulatory Visit
Admission: RE | Admit: 2019-01-30 | Discharge: 2019-01-30 | Disposition: A | Discharge status: Home / Self Care | Payer: Medicare HMO | Source: Ambulatory Visit | Attending: Family Medicine | Admitting: Family Medicine

## 2019-01-30 ENCOUNTER — Other Ambulatory Visit: Payer: Self-pay

## 2019-01-30 ENCOUNTER — Ambulatory Visit
Admission: RE | Admit: 2019-01-30 | Discharge: 2019-01-30 | Disposition: A | Discharge status: Home / Self Care | Payer: Medicare HMO | Attending: Family Medicine | Admitting: Family Medicine

## 2019-01-30 DIAGNOSIS — M159 Polyosteoarthritis, unspecified: Secondary | ICD-10-CM

## 2019-01-30 DIAGNOSIS — M4186 Other forms of scoliosis, lumbar region: Secondary | ICD-10-CM | POA: Diagnosis not present

## 2019-01-30 DIAGNOSIS — G8929 Other chronic pain: Secondary | ICD-10-CM

## 2019-01-30 DIAGNOSIS — M8949 Other hypertrophic osteoarthropathy, multiple sites: Secondary | ICD-10-CM | POA: Insufficient documentation

## 2019-01-30 DIAGNOSIS — M25552 Pain in left hip: Secondary | ICD-10-CM

## 2019-01-31 ENCOUNTER — Ambulatory Visit (INDEPENDENT_AMBULATORY_CARE_PROVIDER_SITE_OTHER): Payer: Medicare HMO

## 2019-01-31 DIAGNOSIS — Z23 Encounter for immunization: Secondary | ICD-10-CM | POA: Diagnosis not present

## 2019-01-31 NOTE — Progress Notes (Signed)
I

## 2019-02-12 ENCOUNTER — Other Ambulatory Visit: Payer: Self-pay

## 2019-02-12 ENCOUNTER — Ambulatory Visit: Payer: Medicare HMO | Attending: Family Medicine

## 2019-02-12 DIAGNOSIS — M25552 Pain in left hip: Secondary | ICD-10-CM | POA: Insufficient documentation

## 2019-02-12 DIAGNOSIS — M545 Low back pain, unspecified: Secondary | ICD-10-CM

## 2019-02-12 DIAGNOSIS — M6281 Muscle weakness (generalized): Secondary | ICD-10-CM | POA: Diagnosis not present

## 2019-02-12 NOTE — Patient Instructions (Signed)
Access Code: [email or web address]  URL: [email or web address]  Date: 02/12/2019  Prepared by: Joneen Boers   Exercises Seated Hip Internal Rotation AROM - 10 reps - 3 sets - 3x daily - 7x weekly

## 2019-02-12 NOTE — Therapy (Signed)
[facility]ICINE [address] [place], [address] Phone: [phone]   Fax:  [phone]  Physical Therapy Evaluation  Patient Details  Name: Austin Stewart MRN: [id number] Date of Birth: 1955-12-11 Referring Provider (PT): Saralyn Pilar, DO   Encounter Date: 02/12/2019  PT End of Session - 02/12/19 0806    Visit Number  1    Number of Visits  13    Date for PT Re-Evaluation  03/27/19    PT Start Time  0806    PT Stop Time  0919    PT Time Calculation (min)  73 min    Activity Tolerance  Patient tolerated treatment well    Behavior During Therapy  [facility] for tasks assessed/performed       Past Medical History:  Diagnosis Date  . Allergic rhinitis, cause unspecified   . Allergy   . Diverticulosis of colon (without mention of hemorrhage)   . Esophageal reflux   . Measles without mention of complication   . Mumps without mention of complication   . 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 Paroxysmal SVT (supraventricular tachycardia) (HCC)     Past Surgical History:  Procedure Laterality Date  . CARDIAC CATHETERIZATION  2006   [facility]   . FOOT SURGERY  03/2017   right great toe Dr.Hyatt   . GREEN LIGHT LASER TURP (TRANSURETHRAL RESECTION OF PROSTATE N/A 01/15/2018   Procedure: GREEN LIGHT LASER TURP (TRANSURETHRAL RESECTION OF PROSTATE;  Surgeon: Orson Ape, MD;  Location: [facility] ORS;  Service: Urology;  Laterality: N/A;  . HERNIA REPAIR    . MITRAL VALVE REPAIR  2006  . rotator cuff repair    . SHOULDER ARTHROSCOPY Left 08/24/2016   Procedure: ARTHROSCOPY SHOULDER with debridement;  Surgeon: Juanell Fairly, MD;  Location: [facility] ORS;  Service: Orthopedics;  Laterality: Left;  . TONSILLECTOMY      There were no vitals filed for this visit.   Subjective Assessment - 02/12/19 0810    Subjective  Pain located L posterior greater trochanter: 2/10 L hip  pain currently (pt sitting on a chair), 8/10 at worst for the past 2 months (laying on it at night); Low back pain:  4/10 currently (pt sitting), 4/10 at worst for the past 2 months.    Pertinent History  Chronic L hip pain. Gradual onset about 6 months ago, unknown method of injury. Does not have back pain. Pt also states having B calf tightness. When his L calf hurts, his hip will bother him at night. Had an X-ray L hip which did not reveal arthritis. Pt also had an x-ray for his back which revealed scoliosis. Pt also states having R low back pain. R hip pain usually increases when laying on it at night.  Pt also states that his R anterior thigh and posterior leg goes numb which has been going on for the past 2 years. Pt performs R hamstring stretches which helps it go away.  Pt also performs standing R and L side bend stretches and forward trunk flexion which helps the R LE paresthesia go away.    Patient Stated Goals  Be more mobile, decrease pain, sleep better.    Currently in Pain?  Yes    Pain Score  4     Pain Location  Back    Pain Orientation  Right;Lower    Pain Type  Chronic pain    Pain  Onset  More than a month ago    Pain Frequency  Occasional    Aggravating Factors   L hip: L S/L (wakes him up in the middle of the night), standing for about 30 minutes; Back: standing for about 30 minutes    Pain Relieving Factors  Baclofen helped with the hip pain but pain returns at times but not as intense; pt stretches (flexion preference)         [facility] PT Assessment - 02/12/19 0810      Assessment   Medical Diagnosis  Chronic L hip pain, primary arthritis involving multiple joints    Referring Provider (PT)  Nobie Putnam, DO    Onset Date/Surgical Date  01/28/19    Hand Dominance  Left    Prior Therapy  Pt had PT for shoulder surgery with good results; no known PT for current condition      Precautions   Precaution Comments  No known precautions      Restrictions   Other  Position/Activity Restrictions  No known restrictions      Balance Screen   Has the patient fallen in the past 6 months  No    Has the patient had a decrease in activity level because of a fear of falling?   No    Is the patient reluctant to leave their home because of a fear of falling?   No      Prior Function   Vocation  Full time Psychiatric nurse Requirements  PLOF: less difficulty standing up for about 30 minutes, less difficulty sleeping on his L side      Posture/Postural Control   Posture Comments  slight R lateral shift; L shoulder lower, R lumbar rotation, backward lean. L LE in slight ER      AROM   Overall AROM Comments  prone B hip IR: L hip more limited    Lumbar Flexion  full with B gastroc tightness which decreased with repeated flexion    Lumbar Extension  limited    Lumbar - Right Side Bend  WFL   low back pain with overpressure   Lumbar - Left Side Bend  WFL    Lumbar - Right Rotation  full    Lumbar - Left Rotation  WFL      PROM   Overall PROM Comments  supine hip 90/90: IR: 22 degrees L, 29 degrees R      Strength   Overall Strength Comments  L clamshell concentric and eccentric with manual resistance: no pain    Right Hip Flexion  4/5    Right Hip Extension  4/5    Right Hip External Rotation   4/5   with joint pain   Right Hip Internal Rotation  4-/5    Right Hip ABduction  4/5    Left Hip Flexion  4-/5    Left Hip Extension  3+/5    Left Hip External Rotation  4-/5    Left Hip Internal Rotation  4-/5    Left Hip ABduction  4/5    Right Knee Flexion  4/5    Right Knee Extension  5/5    Left Knee Flexion  4/5    Left Knee Extension  5/5      Palpation   Palpation comment  TTP L posterior greater trochanter. No TTP L piriformis; R lumbar paraspinal muscle tension       Special Tests   Other special tests  (+)  Slump test L LE (gastroc), (+) Slump test R LE; decreased femoral control when stepping up onto a step. L SLS,  slight R pelvic drop . Decreased mobility with R UPA to L5, L4, L3 with reproduction of R low back pain. Decreased mobility with CPA to L5 with L posterior hip pain (proximal sciatic, different from L hip pain complaint); (-) L piriformis test. (-) long sit test B LE. (+) L hip scour test      Ambulation/Gait   Gait Comments  B pelvic drop during stance phase, L anterior pelvic rotation during L LE swing phase, B foot pronation during stance phase.                 Objective measurements completed on examination: See above findings.    No latex band allergies   Medbridge Access Code: [email or web address]   Therapeutic exercise  L low back pain now instead of R in sitting prior to ther-ex  Supine L hip IR stretch with PT 2x  Supine L hip IR at 90/90 improved to 30 degrees   Seated L hip IR AROM 10x3  Reviewed and given as part of his HEP. Pt demonstrated and verbalized understanding. Handout provided.        Improved exercise technique, movement at target joints, use of target muscles after mod verbal, visual, tactile cues.    No sitting back pain afterwards   Pt is a 63 year old male who came to physical therapy secondary to L hip pain. He also presents with low back pain, altered posture, decreased L hip IR, B hip weakness, TTP posterior greater trochanter, TTP to R low back, and difficulty performing tasks which involve prolonged standing as well as difficulty maintaining sleep secondary to pain with L S/L. Pt will benefit from skilled physical therapy services to address the aforementioned deficits.        PT Education - 02/12/19 1237    Education provided  Yes    Education Details  ther-ex, HEP, plan of care    Person(s) Educated  Patient    Methods  Explanation;Demonstration;Tactile cues;Verbal cues    Comprehension  Verbalized understanding;Returned demonstration       PT Short Term Goals - 02/12/19 1928      PT SHORT TERM GOAL #1   Title  Patient will be  independent with his HEP to decrease L hip and low back pain, as well as improve ability to remain sleeping at night and improve standing tolerance.    Baseline  Pt has started his HEP (02/12/2019)    Time  3    Period  Weeks    Status  New    Target Date  03/06/19        PT Long Term Goals - 02/12/19 1930      PT LONG TERM GOAL #1   Title  Patient will have a decrease in L hip pain to 4/10 or less at worst to promote ability to remain sleeping at night as well as improve standing tolerance for work.    Baseline  8/10 L hip pain at worst for the past 2 months. (02/12/2019)    Time  6    Period  Weeks    Status  New    Target Date  03/27/19      PT LONG TERM GOAL #2   Title  Patient will have a decrease in low back pain to 2/10 or less at worst to promote ability to tolerated standing for  long periods for work.    Baseline  4/10 low back pain at worst for the past 2 months (02/12/2019)    Time  6    Period  Weeks    Status  New    Target Date  03/27/19      PT LONG TERM GOAL #3   Title  Patient will improve L hip IR PROM at 90/90 position by 10 degrees to promote mobility and help decrease L hip pain.    Baseline  22 degrees R hip IR PROM (02/12/2019)    Time  6    Period  Weeks    Status  New    Target Date  03/27/19      PT LONG TERM GOAL #4   Title  Patient will improve bilateral hip extension and abduction strength by at least 1/2 MMT grade to promote ability to perform standing tasks with less hip and back pain.    Baseline  hip extension: 4/5 R, 3+/5 L; hip abduction: 4/5 R, and L (02/12/2019)    Time  6    Period  Weeks    Status  New    Target Date  03/27/19             Plan - 02/12/19 1255    Clinical Impression Statement  Pt is a 63 year old male who came to physical therapy secondary to L hip pain. He also presents with low back pain, altered posture, decreased L hip IR, B hip weakness, TTP posterior greater trochanter, TTP to R low back, and difficulty  performing tasks which involve prolonged standing as well as difficulty maintaining sleep secondary to pain with L S/L. Pt will benefit from skilled physical therapy services to address the aforementioned deficits.    Personal Factors and Comorbidities  Age;Time since onset of injury/illness/exacerbation;Fitness;Profession;Comorbidity 1    Comorbidities  tachycardia    Examination-Activity Limitations  Stand;Sleep    Stability/Clinical Decision Making  Stable/Uncomplicated    Clinical Decision Making  Low    Rehab Potential  Fair    Clinical Impairments Affecting Rehab Potential  (-) Chronicity of condition, pain, hip weakness; (+) motivated    PT Frequency  2x / week    PT Duration  6 weeks    PT Treatment/Interventions  Aquatic Therapy;Electrical Stimulation;Iontophoresis /ml Dexamethasone;Ultrasound;Stair training;Functional mobility training;Therapeutic activities;Therapeutic exercise;Balance training;Neuromuscular re-education;Patient/family education;Manual techniques;Dry needling;Joint Manipulations;Spinal Manipulations   manipulations if appropriate   PT Next Visit Plan  hip ROM, hip strengthening, manual techniques, modalities PRN    PT Home Exercise Plan  Medbridge Access Code: [email or web address]    Consulted and Agree with Plan of Care  Patient       Patient will benefit from skilled therapeutic intervention in order to improve the following deficits and impairments:  Pain, Improper body mechanics, Postural dysfunction, Decreased strength, Decreased range of motion  Visit Diagnosis: Pain in left hip - Plan: PT plan of care cert/re-cert  Low back pain, unspecified back pain laterality, unspecified chronicity, unspecified whether sciatica present - Plan: PT plan of care cert/re-cert  Muscle weakness (generalized) - Plan: PT plan of care cert/re-cert     Problem List Patient Active Problem List   Diagnosis Date Noted  . Chronic left hip pain 01/28/2019  . Paroxysmal SVT  (supraventricular tachycardia) (HCC) 07/18/2018  . 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 Primary osteoarthritis involving multiple joints 01/11/2018  . History of shingles 08/27/2017  . Elevated serum creatinine 06/28/2017  .  Trochanteric bursitis, left hip 02/01/2016  . Pre-diabetes 08/05/2015  . Seasonal allergies 08/05/2015  . Elevated lipids 08/05/2015  . Chest tightness 06/11/2015  . Cutaneous skin tags 01/05/2015  . GERD without esophagitis 12/07/2014  . BPH (benign prostatic hyperplasia) 12/07/2014  . Diastolic CHF (HCC) 06/09/2014  . Shortness of breath 06/09/2013  . H/O mitral valve repair 06/03/2012  . Heart palpitations 06/03/2012  . Dizziness 06/03/2012     Loralyn FreshwaterMiguel Briar Sword PT, DPT   02/12/2019, 7:43 PM  [place] [facility] [address] [place], [place], [address] Phone: [phone]   Fax:  [phone]  Name: Austin Stewart MRN: [id number] Date of Birth: 12/11/1955

## 2019-02-17 ENCOUNTER — Other Ambulatory Visit: Payer: Self-pay

## 2019-02-17 ENCOUNTER — Ambulatory Visit: Payer: Medicare HMO

## 2019-02-17 DIAGNOSIS — M545 Low back pain, unspecified: Secondary | ICD-10-CM

## 2019-02-17 DIAGNOSIS — M25552 Pain in left hip: Secondary | ICD-10-CM | POA: Diagnosis not present

## 2019-02-17 DIAGNOSIS — M6281 Muscle weakness (generalized): Secondary | ICD-10-CM | POA: Diagnosis not present

## 2019-02-17 NOTE — Patient Instructions (Signed)
Access Code: [email or web address]  URL: [email or web address]  Date: 02/17/2019  Prepared by: Joneen Boers   Exercises Seated Hip Internal Rotation AROM - 10 reps - 3 sets - 3x daily - 7x weekly Prone Quadriceps Set - 10 reps - 3 sets - 5 seconds hold - 1x daily - 7x weekly

## 2019-02-17 NOTE — Therapy (Signed)
[facility] [address] [place], [address] Phone: [phone]   Fax:  [phone]  Physical Therapy Treatment  Patient Details  Name: Austin Stewart MRN: [id number] Date of Birth: 1956-04-21 Referring Provider (PT): Saralyn Pilar, DO   Encounter Date: 02/17/2019  PT End of Session - 02/17/19 1652    Visit Number  2    Number of Visits  13    Date for PT Re-Evaluation  03/27/19    PT Start Time  1652    PT Stop Time  1732    PT Time Calculation (min)  40 min    Activity Tolerance  Patient tolerated treatment well    Behavior During Therapy  [facility] for tasks assessed/performed       Past Medical History:  Diagnosis Date  . Allergic rhinitis, cause unspecified   . Allergy   . Diverticulosis of colon (without mention of hemorrhage)   . Esophageal reflux   . Measles without mention of complication   . Mumps without mention of complication   . 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 Paroxysmal SVT (supraventricular tachycardia) (HCC)     Past Surgical History:  Procedure Laterality Date  . CARDIAC CATHETERIZATION  2006   [facility]   . FOOT SURGERY  03/2017   right great toe Dr.Hyatt   . GREEN LIGHT LASER TURP (TRANSURETHRAL RESECTION OF PROSTATE N/A 01/15/2018   Procedure: GREEN LIGHT LASER TURP (TRANSURETHRAL RESECTION OF PROSTATE;  Surgeon: Orson Ape, MD;  Location: [facility] ORS;  Service: Urology;  Laterality: N/A;  . HERNIA REPAIR    . MITRAL VALVE REPAIR  2006  . rotator cuff repair    . SHOULDER ARTHROSCOPY Left 08/24/2016   Procedure: ARTHROSCOPY SHOULDER with debridement;  Surgeon: Juanell Fairly, MD;  Location: [facility] ORS;  Service: Orthopedics;  Laterality: Left;  . TONSILLECTOMY      There were no vitals filed for this visit.  Subjective Assessment - 02/17/19 1653    Subjective  L hip is doing better. The next day after the eval, pt was a  little sore. No L hip or low back currently. Low back pain today was less than a 5/10.    Pertinent History  Chronic L hip pain. Gradual onset about 6 months ago, unknown method of injury. Does not have back pain. Pt also states having B calf tightness. When his L calf hurts, his hip will bother him at night. Had an X-ray L hip which did not reveal arthritis. Pt also had an x-ray for his back which revealed scoliosis. Pt also states having R low back pain. R hip pain usually increases when laying on it at night.  Pt also states that his R anterior thigh and posterior leg goes numb which has been going on for the past 2 years. Pt performs R hamstring stretches which helps it go away.  Pt also performs standing R and L side bend stretches and forward trunk flexion which helps the R LE paresthesia go away.    Patient Stated Goals  Be more mobile, decrease pain, sleep better.    Currently in Pain?  No/denies    Pain Onset  More than a month ago                               PT Education - 02/17/19 1657  Education provided  Yes    Education Details  ther-ex    Person(s) Educated  Patient    Methods  Explanation;Demonstration;Tactile cues;Verbal cues    Comprehension  Returned demonstration;Verbalized understanding       Objective     No latex band allergies   Medbridge Access Code: [email or web address]          Therapeutic exercise  Supine L hip IR stretch with PT 1x             Supine L hip IR AAROM with PT 10x3            Supine bridge 10x3 with B ankle DF to promote glute max muscle strengthening   S/L hip abduction  L 10x2  R 10x2  Prone glute max extension   L 10x2  R 10x2  Prone glute and quad set  L 10x5 seconds for 3 sets  R 10x5 secondsfor 3 sets   Running man to promote glute max strengthening  L LE 10x3  SLS with B UE light touch to no UE assist to promote glute med muscle strengthening  L LE 10x5 seconds for 3 sets   Seated hip  adduction ball and glute max squeeze 10x5 seconds     Improved exercise technique, movement at target joints, use of target muscles after min to mod verbal, visual, tactile cues.   Response to treatment Good muscle use felt with exercises. Pt tolerated session well without aggravation of symptoms.   Clinical impression Continued working on improving L hip IR ROM, followed by glute max and med strengthening to promote proper mechanics at his L hip and low back. No complain of L hip pain throughout session. Pt will benefit from continued skilled physical therapy services to decrease pain, improve ROM, strength, and function.     PT Short Term Goals - 02/12/19 1928      PT SHORT TERM GOAL #1   Title  Patient will be independent with his HEP to decrease L hip and low back pain, as well as improve ability to remain sleeping at night and improve standing tolerance.    Baseline  Pt has started his HEP (02/12/2019)    Time  3    Period  Weeks    Status  New    Target Date  03/06/19        PT Long Term Goals - 02/12/19 1930      PT LONG TERM GOAL #1   Title  Patient will have a decrease in L hip pain to 4/10 or less at worst to promote ability to remain sleeping at night as well as improve standing tolerance for work.    Baseline  8/10 L hip pain at worst for the past 2 months. (02/12/2019)    Time  6    Period  Weeks    Status  New    Target Date  03/27/19      PT LONG TERM GOAL #2   Title  Patient will have a decrease in low back pain to 2/10 or less at worst to promote ability to tolerated standing for long periods for work.    Baseline  4/10 low back pain at worst for the past 2 months (02/12/2019)    Time  6    Period  Weeks    Status  New    Target Date  03/27/19      PT LONG TERM GOAL #3   Title  Patient will improve L  hip IR PROM at 90/90 position by 10 degrees to promote mobility and help decrease L hip pain.    Baseline  22 degrees R hip IR PROM (02/12/2019)     Time  6    Period  Weeks    Status  New    Target Date  03/27/19      PT LONG TERM GOAL #4   Title  Patient will improve bilateral hip extension and abduction strength by at least 1/2 MMT grade to promote ability to perform standing tasks with less hip and back pain.    Baseline  hip extension: 4/5 R, 3+/5 L; hip abduction: 4/5 R, and L (02/12/2019)    Time  6    Period  Weeks    Status  New    Target Date  03/27/19            Plan - 02/17/19 1704    Clinical Impression Statement  Continued working on improving L hip IR ROM, followed by glute max and med strengthening to promote proper mechanics at his L hip and low back. No complain of L hip pain throughout session. Pt will benefit from continued skilled physical therapy services to decrease pain, improve ROM, strength, and function.    Personal Factors and Comorbidities  Age;Time since onset of injury/illness/exacerbation;Fitness;Profession;Comorbidity 1    Comorbidities  tachycardia    Examination-Activity Limitations  Stand;Sleep    Stability/Clinical Decision Making  Stable/Uncomplicated    Rehab Potential  Fair    Clinical Impairments Affecting Rehab Potential  (-) Chronicity of condition, pain, hip weakness; (+) motivated    PT Frequency  2x / week    PT Duration  6 weeks    PT Treatment/Interventions  Aquatic Therapy;Electrical Stimulation;Iontophoresis 4mg /ml Dexamethasone;Ultrasound;Stair training;Functional mobility training;Therapeutic activities;Therapeutic exercise;Balance training;Neuromuscular re-education;Patient/family education;Manual techniques;Dry needling;Joint Manipulations;Spinal Manipulations   manipulations if appropriate   PT Next Visit Plan  hip ROM, hip strengthening, manual techniques, modalities PRN    PT Home Exercise Plan  Medbridge Access Code: [email or web address]    Consulted and Agree with Plan of Care  Patient       Patient will benefit from skilled therapeutic intervention in order to improve the  following deficits and impairments:  Pain, Improper body mechanics, Postural dysfunction, Decreased strength, Decreased range of motion  Visit Diagnosis: Pain in left hip  Low back pain, unspecified back pain laterality, unspecified chronicity, unspecified whether sciatica present  Muscle weakness (generalized)     Problem List Patient Active Problem List   Diagnosis Date Noted  . Chronic left hip pain 01/28/2019  . Paroxysmal SVT (supraventricular tachycardia) (HCC) 07/18/2018  . 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 Primary osteoarthritis involving multiple joints 01/11/2018  . History of shingles 08/27/2017  . Elevated serum creatinine 06/28/2017  . Trochanteric bursitis, left hip 02/01/2016  . Pre-diabetes 08/05/2015  . Seasonal allergies 08/05/2015  . Elevated lipids 08/05/2015  . Chest tightness 06/11/2015  . Cutaneous skin tags 01/05/2015  . GERD without esophagitis 12/07/2014  . BPH (benign prostatic hyperplasia) 12/07/2014  . Diastolic CHF (HCC) 06/09/2014  . Shortness of breath 06/09/2013  . H/O mitral valve repair 06/03/2012  . Heart palpitations 06/03/2012  . Dizziness 06/03/2012    Loralyn FreshwaterMiguel Laygo PT, DPT   02/17/2019, 5:50 PM  [facility] [address] [place], [place], [address] Phone: [phone]   Fax:  [phone]  Name: Austin Stewart MRN: [id number] Date of Birth:  08/09/1955   

## 2019-02-18 ENCOUNTER — Other Ambulatory Visit: Payer: Self-pay | Admitting: Family Medicine

## 2019-02-18 ENCOUNTER — Encounter: Payer: Self-pay | Admitting: Family Medicine

## 2019-02-18 ENCOUNTER — Other Ambulatory Visit: Payer: Self-pay

## 2019-02-18 ENCOUNTER — Ambulatory Visit (INDEPENDENT_AMBULATORY_CARE_PROVIDER_SITE_OTHER): Payer: Medicare HMO | Admitting: Family Medicine

## 2019-02-18 VITALS — BP 124/86 | HR 66 | Temp 98.2°F | Resp 18 | Ht 70.0 in | Wt 189.6 lb

## 2019-02-18 DIAGNOSIS — M25552 Pain in left hip: Secondary | ICD-10-CM | POA: Diagnosis not present

## 2019-02-18 DIAGNOSIS — M7062 Trochanteric bursitis, left hip: Secondary | ICD-10-CM | POA: Diagnosis not present

## 2019-02-18 DIAGNOSIS — N401 Enlarged prostate with lower urinary tract symptoms: Secondary | ICD-10-CM

## 2019-02-18 DIAGNOSIS — E785 Hyperlipidemia, unspecified: Secondary | ICD-10-CM

## 2019-02-18 DIAGNOSIS — G8929 Other chronic pain: Secondary | ICD-10-CM

## 2019-02-18 DIAGNOSIS — M150 Primary generalized (osteo)arthritis: Secondary | ICD-10-CM

## 2019-02-18 DIAGNOSIS — R7989 Other specified abnormal findings of blood chemistry: Secondary | ICD-10-CM

## 2019-02-18 DIAGNOSIS — R3914 Feeling of incomplete bladder emptying: Secondary | ICD-10-CM

## 2019-02-18 DIAGNOSIS — Z0000 Encounter for general adult medical examination without abnormal findings: Secondary | ICD-10-CM

## 2019-02-18 DIAGNOSIS — R7303 Prediabetes: Secondary | ICD-10-CM

## 2019-02-18 DIAGNOSIS — M159 Polyosteoarthritis, unspecified: Secondary | ICD-10-CM

## 2019-02-18 NOTE — Patient Instructions (Addendum)
Thank you for coming to the office today.  Continue physical therapy - may not need full 10 visits. Continue home exercise regimen.  May phase out of Meloxicam, take only when truly needed.  Baclofen is a safer option, can use at night as needed - or phase off of this as well.  Recommend to start taking Tylenol Extra Strength 500mg  tabs - take 1 to 2 tabs per dose (max 1000mg ) every 6-8 hours for pain (take regularly, don't skip a dose for next 7 days), max 24 hour daily dose is 6 tablets or 3000mg . In the future you can repeat the same everyday Tylenol course for 1-2 weeks at a time.   Contact us if you want to schedule for a steroid hip joint injection - we can offer this on side of hip if you need, if need the meloxicam longer or feel like symptoms are worsening.  Also can do a Virtual Telephone visit if needed for minor adjustments.  Future consider Orthopedic.  May use Mucinex for now and also can add very short course of Sudafed for sinus decongestion only short term if needed   DUE for FASTING BLOOD WORK (no food or drink after midnight before the lab appointment, only water or coffee without cream/sugar on the morning of)  SCHEDULE "Lab Only" visit in the morning at the clinic for lab draw in 5-6 MONTHS   - Make sure Lab Only appointment is at about 1 week before your next appointment, so that results will be available  For Lab Results, once available within 2-3 days of blood draw, you can can log in to MyChart online to view your results and a brief explanation. Also, we can discuss results at next follow-up visit.   Please schedule a Follow-up Appointment to: Return in about 5 months (around 07/19/2019) for Annual Physical.  If you have any other questions or concerns, please feel free to call the office or send a message through [place]. You may also schedule an earlier appointment if necessary.  Additionally, you may be receiving a survey about your experience at our office  within a few days to 1 week by e-mail or mail. We value your feedback.  Nobie Putnam, DO [place]

## 2019-02-18 NOTE — Progress Notes (Signed)
Subjective:    Patient ID: Austin Stewart, male    DOB: May 08, 1955, 63 y.o.   MRN: [id number]  Austin Stewart is a 63 y.o. male presenting on 02/18/2019 for Hip Pain (Left )   HPI   Follow-up Left Hip Pain, Chronic - Last visit with me 01/28/19, for initial visit for same problem, treated with meloxicam / baclofen PRN, refer to [facility] PT, see prior notes for background information. - Interval update with x 2 PT sessions done, now plans to wean off meds - Today patient reports that his L hip is still sore at times, but overall some improved, [facility] PT - focusing on lower back and gluteal strengthening, some mild to moderate improvement, he is not sure if he is ready to complete with all sessions but will continue for now. Also Left calf area, less stiff now it is improving with therapy, it was cramping before. - Still taking Meloxicam 15mg  daily PRN, wants to phase off of this, and Baclofen PRN QHS - Had prior L hip x-ray, requesting to review results, see below - Known arthritis in other joints. - No prior injection or surgery or orthopedic consultation Denies any new injury fall trauma numbness tingling weakness   Health Maintenance: UTD Flu Vaccine.  Depression screen [facility] 2/9 02/18/2019 01/28/2019 07/17/2018  Decreased Interest 1 0 0  Down, Depressed, Hopeless 0 0 0  PHQ - 2 Score 1 0 0  Altered sleeping 0 - -  Tired, decreased energy 0 - -  Change in appetite 0 - -  Feeling bad or failure about yourself  0 - -  Trouble concentrating 0 - -  Moving slowly or fidgety/restless 0 - -  Suicidal thoughts 0 - -  PHQ-9 Score 1 - -  Difficult doing work/chores Not difficult at all - -  Some encounter information is confidential and restricted. Go to Review Flowsheets activity to see all data.    Social History   Tobacco Use  . Smoking status: Never Smoker  . Smokeless tobacco: Never Used  Substance Use Topics  . Alcohol use: No  . Drug use: No    Review of Systems Per HPI  unless specifically indicated above     Objective:    BP 124/86 (BP Location: Left Arm, Cuff Size: Normal)   Pulse 66   Temp 98.2 F (36.8 C) (Oral)   Resp 18   Ht 5\' 10"  (1.778 m)   Wt 189 lb 9.6 oz (86 kg)   BMI 27.20 kg/m   Wt Readings from Last 3 Encounters:  02/18/19 189 lb 9.6 oz (86 kg)  11/11/18 190 lb (86.2 kg)  09/25/18 193 lb 1.9 oz (87.6 kg)    Physical Exam Vitals signs and nursing note reviewed.  Constitutional:      General: He is not in acute distress.    Appearance: He is well-developed. He is not diaphoretic.     Comments: Well-appearing, comfortable, cooperative  HENT:     Head: Normocephalic and atraumatic.  Eyes:     General:        Right eye: No discharge.        Left eye: No discharge.     Conjunctiva/sclera: Conjunctivae normal.  Cardiovascular:     Rate and Rhythm: Normal rate.  Pulmonary:     Effort: Pulmonary effort is normal.  Musculoskeletal:     Comments: Left Hip, good range of motion hip flex/ext, initially mild stiff but improved. Localized tenderness over lateral greater  trochanter L side, has good internal rotation some mild provoked discomfort.  Skin:    General: Skin is warm and dry.     Findings: No erythema or rash.  Neurological:     Mental Status: He is alert and oriented to person, place, and time.  Psychiatric:        Behavior: Behavior normal.     Comments: Well groomed, good eye contact, normal speech and thoughts       I have personally reviewed the radiology report from 01/30/19 L Hip X-ray  G HIP UNILAT W OR W/O PELVIS 2-3 VIEWS LEFTPerformed 01/30/2019 Final result  Notes recorded by Galen Manila, NP on 01/31/2019 at 3:25 PM EDT Narrowing of the hip joint may be cause of some of your pain. It may also be normal for you. No evidence of joint deterioration or other cause of pain. Continue current treatment plan.   Study Result CLINICAL DATA: Progressive chronic pain  EXAM: DG HIP (WITH OR WITHOUT PELVIS)  2-3V LEFT  COMPARISON: None.  FINDINGS: Frontal pelvis as well as frontal and lateral left hip images were obtained. No fracture or dislocation. There is slight symmetric narrowing of each hip joint. Sacroiliac joints appear unremarkable bilaterally. No erosive change. There is lower lumbar dextroscoliosis. Postoperative changes are noted in the pelvis.  IMPRESSION: Slight symmetric narrowing of each hip joint. Sacroiliac joints appear normal bilaterally. No erosive change. There is lower lumbar dextroscoliosis. No fracture or dislocation.   Electronically Signed By: Bretta Bang III M.D. On: 01/30/2019 14:25   Results for orders placed or performed during the hospital encounter of 10/03/18  NM Myocar Multi W/Spect W/Wall Motion / EF  Result Value Ref Range   SSS 0    SRS 1    SDS 4    TID 0.95    LV sys vol 47 mL   LV dias vol 118 62 - 150 mL   Rest HR 61 bpm   Rest BP 141/89 mmHg   Peak HR 88 bpm   Peak BP 140/78 mmHg      Assessment & Plan:   Problem List Items Addressed This Visit    Trochanteric bursitis, left hip   Chronic left hip pain - Primary      No orders of the defined types were placed in this encounter.  Gradual improvement on meds / PT Subacute on chronic L Hip pain, likely osteoarthritis underlying with trochanteric bursitis flare up - No radiation of pain or radicular symptoms  Plan: 1. Continue [facility] PT - has sessions scheduled, proceed as planned for now, may not need to complete entire series if significant improvement or can transition to home exercise regimen 2. Discussion on phasing off Meloxicam 15mg  daily PRN - use PRN ONLY for now, goal to limit NSAID use. Also can phase off of Baclofen PRN QHS if not needed. 3. May use Tylenol PRN for breakthrough 4. Encouraged use of heating pad 1-2x daily for now then PRN. 5. Follow-up as needed now in next several weeks to months if interested in troch bursa steroid injection (discussed today)  or can return to prior orthopedic if indicated   Follow up plan: Return in about 5 months (around 07/19/2019) for Annual Physical.  Future labs ordered for 06/2019   07/2019, DO [facility] 02/18/2019, 4:22 PM

## 2019-02-19 ENCOUNTER — Ambulatory Visit: Payer: Medicare HMO

## 2019-02-19 DIAGNOSIS — M25552 Pain in left hip: Secondary | ICD-10-CM | POA: Diagnosis not present

## 2019-02-19 DIAGNOSIS — M545 Low back pain, unspecified: Secondary | ICD-10-CM

## 2019-02-19 DIAGNOSIS — M6281 Muscle weakness (generalized): Secondary | ICD-10-CM | POA: Diagnosis not present

## 2019-02-19 NOTE — Therapy (Signed)
[facility] [address] [place], [address] Phone: [phone]   Fax:  [phone]  Physical Therapy Treatment  Patient Details  Name: Austin Stewart MRN: [id number] Date of Birth: May 06, 1955 Referring Provider (PT): Saralyn Pilar, DO   Encounter Date: 02/19/2019  PT End of Session - 02/19/19 1742    Visit Number  3    Number of Visits  13    Date for PT Re-Evaluation  03/27/19    PT Start Time  1743    PT Stop Time  1825    PT Time Calculation (min)  42 min    Activity Tolerance  Patient tolerated treatment well    Behavior During Therapy  [facility] for tasks assessed/performed       Past Medical History:  Diagnosis Date  . Allergic rhinitis, cause unspecified   . Allergy   . Diverticulosis of colon (without mention of hemorrhage)   . Esophageal reflux   . Measles without mention of complication   . Mumps without mention of complication   . 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 Paroxysmal SVT (supraventricular tachycardia) (HCC)     Past Surgical History:  Procedure Laterality Date  . CARDIAC CATHETERIZATION  2006   [facility]   . FOOT SURGERY  03/2017   right great toe Dr.Hyatt   . GREEN LIGHT LASER TURP (TRANSURETHRAL RESECTION OF PROSTATE N/A 01/15/2018   Procedure: GREEN LIGHT LASER TURP (TRANSURETHRAL RESECTION OF PROSTATE;  Surgeon: Orson Ape, MD;  Location: [facility] ORS;  Service: Urology;  Laterality: N/A;  . HERNIA REPAIR    . MITRAL VALVE REPAIR  2006  . rotator cuff repair    . SHOULDER ARTHROSCOPY Left 08/24/2016   Procedure: ARTHROSCOPY SHOULDER with debridement;  Surgeon: Juanell Fairly, MD;  Location: [facility] ORS;  Service: Orthopedics;  Laterality: Left;  . TONSILLECTOMY      There were no vitals filed for this visit.  Subjective Assessment - 02/19/19 1743    Subjective  Was not able to do his HEP. L hip is doing pretty good. Was  aching last night and early this morning. Was not as bad as it has been. Pt however was standing up for about 45 minutes yesterday to vote. Pt states that he wants to get off the baclofen and the mobic. His MD told him to try just taking them on PT days.    Pertinent History  Chronic L hip pain. Gradual onset about 6 months ago, unknown method of injury. Does not have back pain. Pt also states having B calf tightness. When his L calf hurts, his hip will bother him at night. Had an X-ray L hip which did not reveal arthritis. Pt also had an x-ray for his back which revealed scoliosis. Pt also states having R low back pain. R hip pain usually increases when laying on it at night.  Pt also states that his R anterior thigh and posterior leg goes numb which has been going on for the past 2 years. Pt performs R hamstring stretches which helps it go away.  Pt also performs standing R and L side bend stretches and forward trunk flexion which helps the R LE paresthesia go away.    Patient Stated Goals  Be more mobile, decrease pain, sleep better.    Currently in Pain?  No/denies    Pain Onset  More than a month ago  PT Education - 02/19/19 1745    Education provided  Yes    Education Details  ther-ex    [employer]) Educated  Patient    Methods  Explanation;Demonstration;Tactile cues;Verbal cues    Comprehension  Verbalized understanding;Returned demonstration        Objective     No latex band allergies   MedbridgeAccess Code: [email or web address]  Therapeutic exercise  Supine L hip IR stretch with towel 30 seconds x 3  Supine L hip IRAAROMwith PT 10x3  Supine bridge 10x3 to promote glute max muscle strengthening   S/L hip abduction             L 10x2 (more difficult compared to R)              R 10x2  Prone glute max extension              L 10x2             R 10x2  SLS with B UE light touch to no UE  assist to promote glute med muscle strengthening             L LE 10x5 seconds for 3 sets  Running man to promote glute max strengthening             L LE 10x3  Max cues for proper form and keep back in neutral extension (decrease flexion posture and R rotation). Decreased posterior hamstring pull and improved L glute max and quadriceps muscle activation.    Seated hip adduction ball and glute max squeeze 10x5 seconds for 2 sets   Seated B scapular retraction in upright posture green band 10x5 seconds for 3 sets to promote thoracic and gentle lumbar extension   Improved exercise technique, movement at target joints, use of target muscles after min to max verbal, visual, tactile cues.    Response to treatment Good muscle use felt with exercises. Pt tolerated session well without aggravation of symptoms.   Clinical impression Pt demonstrates overall improving L hip pain based on subjective reports and desire to stop taking his pain medications. Continued working on decreasing L hip IR restriction followed by glute med, and max strengthening. Pt tolerated session well without complain of pain. Pt will benefit from continued skilled physical therapy services to decrease pain, improve strength, and function.     PT Short Term Goals - 02/12/19 1928      PT SHORT TERM GOAL #1   Title  Patient will be independent with his HEP to decrease L hip and low back pain, as well as improve ability to remain sleeping at night and improve standing tolerance.    Baseline  Pt has started his HEP (02/12/2019)    Time  3    Period  Weeks    Status  New    Target Date  03/06/19        PT Long Term Goals - 02/12/19 1930      PT LONG TERM GOAL #1   Title  Patient will have a decrease in L hip pain to 4/10 or less at worst to promote ability to remain sleeping at night as well as improve standing tolerance for work.    Baseline  8/10 L hip pain at worst for the past 2 months. (02/12/2019)     Time  6    Period  Weeks    Status  New    Target Date  03/27/19      PT LONG  TERM GOAL #2   Title  Patient will have a decrease in low back pain to 2/10 or less at worst to promote ability to tolerated standing for long periods for work.    Baseline  4/10 low back pain at worst for the past 2 months (02/12/2019)    Time  6    Period  Weeks    Status  New    Target Date  03/27/19      PT LONG TERM GOAL #3   Title  Patient will improve L hip IR PROM at 90/90 position by 10 degrees to promote mobility and help decrease L hip pain.    Baseline  22 degrees R hip IR PROM (02/12/2019)    Time  6    Period  Weeks    Status  New    Target Date  03/27/19      PT LONG TERM GOAL #4   Title  Patient will improve bilateral hip extension and abduction strength by at least 1/2 MMT grade to promote ability to perform standing tasks with less hip and back pain.    Baseline  hip extension: 4/5 R, 3+/5 L; hip abduction: 4/5 R, and L (02/12/2019)    Time  6    Period  Weeks    Status  New    Target Date  03/27/19            Plan - 02/19/19 1739    Clinical Impression Statement  Pt demonstrates overall improving L hip pain based on subjective reports and desire to stop taking his pain medications. Continued working on decreasing L hip IR restriction followed by glute med, and max strengthening. Pt tolerated session well without complain of pain. Pt will benefit from continued skilled physical therapy services to decrease pain, improve strength, and function.    Personal Factors and Comorbidities  Age;Time since onset of injury/illness/exacerbation;Fitness;Profession;Comorbidity 1    Comorbidities  tachycardia    Examination-Activity Limitations  Stand;Sleep    Stability/Clinical Decision Making  Stable/Uncomplicated    Rehab Potential  Fair    Clinical Impairments Affecting Rehab Potential  (-) Chronicity of condition, pain, hip weakness; (+) motivated    PT Frequency  2x / week    PT  Duration  6 weeks    PT Treatment/Interventions  Aquatic Therapy;Electrical Stimulation;Iontophoresis 4mg /ml Dexamethasone;Ultrasound;Stair training;Functional mobility training;Therapeutic activities;Therapeutic exercise;Balance training;Neuromuscular re-education;Patient/family education;Manual techniques;Dry needling;Joint Manipulations;Spinal Manipulations   manipulations if appropriate   PT Next Visit Plan  hip ROM, hip strengthening, manual techniques, modalities PRN    PT Home Exercise Plan  Medbridge Access Code: [email or web address]    Consulted and Agree with Plan of Care  Patient       Patient will benefit from skilled therapeutic intervention in order to improve the following deficits and impairments:  Pain, Improper body mechanics, Postural dysfunction, Decreased strength, Decreased range of motion  Visit Diagnosis: Pain in left hip  Low back pain, unspecified back pain laterality, unspecified chronicity, unspecified whether sciatica present  Muscle weakness (generalized)     Problem List Patient Active Problem List   Diagnosis Date Noted  . Chronic left hip pain 01/28/2019  . Paroxysmal SVT (supraventricular tachycardia) (HCC) 07/18/2018  . 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 Primary osteoarthritis involving multiple joints 01/11/2018  . History of shingles 08/27/2017  . Elevated serum creatinine 06/28/2017  . Trochanteric bursitis, left hip 02/01/2016  . Pre-diabetes 08/05/2015  . Seasonal allergies 08/05/2015  . Elevated lipids 08/05/2015  .  Chest tightness 06/11/2015  . Cutaneous skin tags 01/05/2015  . GERD without esophagitis 12/07/2014  . BPH (benign prostatic hyperplasia) 12/07/2014  . Diastolic CHF ([place]) 23/36/1224  . Shortness of breath 06/09/2013  . H/O mitral valve repair 06/03/2012  . Heart palpitations 06/03/2012  . Dizziness 06/03/2012    Joneen Boers PT, DPT   02/19/2019, 6:30 PM  [facility] [address], [place], [address] Phone: [phone]   Fax:  [phone]  Name: Austin Stewart MRN: [id number] Date of Birth: 1956/01/17

## 2019-02-19 NOTE — Patient Instructions (Addendum)
Access Code: [email or web address]  URL: [email or web address]  Date: 02/19/2019  Prepared by: Joneen Boers   Exercises Seated Hip Internal Rotation AROM - 10 reps - 3 sets - 3x daily - 7x weekly Prone Quadriceps Set - 10 reps - 3 sets - 5 seconds hold - 1x daily - 7x weekly Sidelying Hip Abduction - 10 reps - 2 sets - 1x daily - 7x weekly Supine Bridge - 10 reps - 3 sets - 1x daily - 7x weekly

## 2019-02-23 ENCOUNTER — Other Ambulatory Visit: Payer: Self-pay | Admitting: Family Medicine

## 2019-02-23 DIAGNOSIS — J302 Other seasonal allergic rhinitis: Secondary | ICD-10-CM

## 2019-02-23 DIAGNOSIS — M159 Polyosteoarthritis, unspecified: Secondary | ICD-10-CM

## 2019-02-24 ENCOUNTER — Ambulatory Visit: Payer: Medicare HMO

## 2019-02-24 ENCOUNTER — Other Ambulatory Visit: Payer: Self-pay

## 2019-02-24 DIAGNOSIS — M545 Low back pain, unspecified: Secondary | ICD-10-CM

## 2019-02-24 DIAGNOSIS — M6281 Muscle weakness (generalized): Secondary | ICD-10-CM

## 2019-02-24 DIAGNOSIS — M25552 Pain in left hip: Secondary | ICD-10-CM | POA: Diagnosis not present

## 2019-02-24 NOTE — Therapy (Signed)
[facility] [address] [place], [address] Phone: [phone]   Fax:  [phone]  Physical Therapy Treatment And Discharge Summary  Patient Details  Name: Austin Stewart MRN: [id number] Date of Birth: [birth year] Referring Provider (PT): Nobie Putnam, DO   Encounter Date: 02/24/2019  PT End of Session - 02/24/19 1620    Visit Number  4    Number of Visits  13    Date for PT Re-Evaluation  03/27/19    PT Start Time  1620    PT Stop Time  1649    PT Time Calculation (min)  29 min    Activity Tolerance  Patient tolerated treatment well    Behavior During Therapy  [facility] for tasks assessed/performed       Past Medical History:  Diagnosis Date  . Allergic rhinitis, cause unspecified   . Allergy   . Diverticulosis of colon (without mention of hemorrhage)   . Esophageal reflux   . Measles without mention of complication   . Mumps without mention of complication   . 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 Paroxysmal SVT (supraventricular tachycardia) (HCC)     Past Surgical History:  Procedure Laterality Date  . CARDIAC CATHETERIZATION  2006   [facility]   . FOOT SURGERY  03/2017   right great toe Dr.Hyatt   . GREEN LIGHT LASER TURP (TRANSURETHRAL RESECTION OF PROSTATE N/A 01/15/2018   Procedure: GREEN LIGHT LASER TURP (TRANSURETHRAL RESECTION OF PROSTATE;  Surgeon: Royston Cowper, MD;  Location: [facility];  Service: Urology;  Laterality: N/A;  . HERNIA REPAIR    . MITRAL VALVE REPAIR  2006  . rotator cuff repair    . SHOULDER ARTHROSCOPY Left 08/24/2016   Procedure: ARTHROSCOPY SHOULDER with debridement;  Surgeon: Thornton Park, MD;  Location: [facility] OR[facility];  Service: Orthopedics;  Laterality: Left;  . TONSILLECTOMY      There were no vitals filed for this visit.  Subjective Assessment - 02/24/19 1621    Subjective  L hip is doing good. No pain  currently. Wants to see if today can be his graduation day.Feels like he can continue his progress with his HEP.  Able to sleep without pain. Has not taken meloxicam since last Wednesday. L hip and back are better during his stand up meetings at work.  Able to sleep at night with less hip pain.    Pertinent History  Chronic L hip pain. Gradual onset about 6 months ago, unknown method of injury. Does not have back pain. Pt also states having B calf tightness. When his L calf hurts, his hip will bother him at night. Had an X-ray L hip which did not reveal arthritis. Pt also had an x-ray for his back which revealed scoliosis. Pt also states having R low back pain. R hip pain usually increases when laying on it at night.  Pt also states that his R anterior thigh and posterior leg goes numb which has been going on for the past 2 years. Pt performs R hamstring stretches which helps it go away.  Pt also performs standing R and L side bend stretches and forward trunk flexion which helps the R LE paresthesia go away.    Patient Stated Goals  Be more mobile, decrease pain, sleep better.    Currently in Pain?  No/denies    Pain Score  0-No pain    Pain Onset  More than a month ago                               PT Education - 02/24/19 1640    Education provided  Yes    Education Details  ther-ex, HEP    Person(s) Educated  Patient    Methods  Explanation;Demonstration;Tactile cues;Verbal cues;Handout    Comprehension  Verbalized understanding;Returned demonstration       Objective     No latex band allergies  Feels like he can continue his progress with his HEP. Able to sleep without pain. Has not taken meloxicam since last Wednesday. L hip and back are better during his stand up meetings at work.    MedbridgeAccess Code: [email or web address]  Therapeutic exercise  Supine L hip IR stretch with towel 30 seconds x 3   Reviewed and given as part of his HEP. Pt  demonstrated and verbalized understanding.    Prone manually resisted hip extension, S/L manually resisted hip abduction 1x each way for each LE  Reviewed progress/current status with PT towards goals   Prone glute extension 10x3 each LE   SLS with B UE light touch to no UE assist to promote glute med muscle strengthening L LE 10x5 seconds for 3 sets   Improved exercise technique, movement at target joints, use of target muscles after min to max verbal, visual, tactile cues.    Response to treatment Good muscle use felt with exercises. Pt tolerated session well without aggravation of symptoms.   Clinical impression  Pt demonstrates significant decrease in L hip and low back pain, improved glute med and max muscle strength, improved ability to sleep at night with less pain (based on subjective reports). Pt demonstrates independence and consistency with his HEP. Skilled physical therapy services discharged with patient continuing progress with his exercises at home.     PT Short Term Goals - 02/24/19 1623      PT SHORT TERM GOAL #1   Title  Patient will be independent with his HEP to decrease L hip and low back pain, as well as improve ability to remain sleeping at night and improve standing tolerance.    Baseline  Pt has started his HEP (02/12/2019); No questions wiht HEP. Has been performing them regularly (02/24/2019)    Time  3    Period  Weeks    Status  Achieved    Target Date  03/06/19        PT Long Term Goals - 02/24/19 1624      PT LONG TERM GOAL #1   Title  Patient will have a decrease in L hip pain to 4/10 or less at worst to promote ability to remain sleeping at night as well as improve standing tolerance for work.    Baseline  8/10 L hip pain at worst for the past 2 months. (02/12/2019); 2-3/10 L hip pain at most for the past 7 days (02/24/2019)    Time  6    Period  Weeks    Status  Achieved    Target Date  03/27/19      PT LONG TERM  GOAL #2   Title  Patient will have a decrease in low back pain to 2/10 or less at worst to promote ability to tolerated standing for long periods for work.    Baseline  4/10 low back pain at worst for the past 2 months (02/12/2019); 2/10 low back pain  at most for the past 7 days (02/24/2019)    Time  6    Period  Weeks    Status  Achieved    Target Date  03/27/19      PT LONG TERM GOAL #3   Title  Patient will improve L hip IR PROM at 90/90 position by 10 degrees to promote mobility and help decrease L hip pain.    Baseline  22 degrees R hip IR PROM (02/12/2019); 28 degrees L hip IR PROM (02/24/2019)    Time  6    Period  Weeks    Status  Partially Met    Target Date  03/27/19      PT LONG TERM GOAL #4   Title  Patient will improve bilateral hip extension and abduction strength by at least 1/2 MMT grade to promote ability to perform standing tasks with less hip and back pain.    Baseline  hip extension: 4/5 R, 3+/5 L; hip abduction: 4/5 R, and L (02/12/2019); hip extension 4+/5 R, 5/5 L, hip abduction 5/5 R, 5/5 L (02/24/2019)    Time  6    Period  Weeks    Status  Achieved    Target Date  03/27/19            Plan - 02/24/19 1644    Clinical Impression Statement  Pt demonstrates significant decrease in L hip and low back pain, improved glute med and max muscle strength, improved ability to sleep at night with less pain (based on subjective reports). Pt demonstrates independence and consistency with his HEP. Skilled physical therapy services discharged with patient continuing progress with his exercises at home.    Personal Factors and Comorbidities  Age;Time since onset of injury/illness/exacerbation;Fitness;Profession;Comorbidity 1    Comorbidities  tachycardia    Examination-Activity Limitations  Stand;Sleep    Stability/Clinical Decision Making  Stable/Uncomplicated    Clinical Decision Making  Low    Rehab Potential  Fair    Clinical Impairments Affecting Rehab Potential   (-) Chronicity of condition, pain, hip weakness; (+) motivated    PT Frequency  --    PT Duration  --    PT Treatment/Interventions  Therapeutic activities;Therapeutic exercise;Manual techniques;Neuromuscular re-education;Patient/family education   manipulations if appropriate   PT Next Visit Plan  continue progress with his HEP    PT Home Exercise Plan  Medbridge Access Code: [email or web address]    Consulted and Agree with Plan of Care  Patient       Patient will benefit from skilled therapeutic intervention in order to improve the following deficits and impairments:  Pain, Improper body mechanics, Postural dysfunction, Decreased strength, Decreased range of motion  Visit Diagnosis: Pain in left hip  Low back pain, unspecified back pain laterality, unspecified chronicity, unspecified whether sciatica present  Muscle weakness (generalized)     Problem List Patient Active Problem List   Diagnosis Date Noted  . Chronic left hip pain 01/28/2019  . Paroxysmal SVT (supraventricular tachycardia) ([place]) 07/18/2018  . 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 Primary osteoarthritis involving multiple joints 01/11/2018  . History of shingles 08/27/2017  . Elevated serum creatinine 06/28/2017  . Trochanteric bursitis, left hip 02/01/2016  . Pre-diabetes 08/05/2015  . Seasonal allergies 08/05/2015  . Elevated lipids 08/05/2015  . Chest tightness 06/11/2015  . Cutaneous skin tags 01/05/2015  . GERD without esophagitis 12/07/2014  . BPH (benign prostatic hyperplasia) 12/07/2014  . Diastolic CHF ([place]) 35/70/1779  . Shortness of breath  06/09/2013  . H/O mitral valve repair 06/03/2012  . Heart palpitations 06/03/2012  . Dizziness 06/03/2012    Thank you for your referral.   Joneen Boers PT, DPT   02/24/2019, 6:03 PM  [facility] [address] [place], [address] Phone: [phone]    Fax:  [phone]  Name: Austin Stewart MRN: [id number] Date of Birth: October 08, 1955

## 2019-02-24 NOTE — Patient Instructions (Addendum)
Access Code: [email or web address]  URL: [email or web address]  Date: 02/24/2019  Prepared by: Joneen Boers   Exercises Seated Hip Internal Rotation AROM - 10 reps - 3 sets - 3x daily - 7x weekly Prone Quadriceps Set - 10 reps - 3 sets - 5 seconds hold - 1x daily - 7x weekly Sidelying Hip Abduction - 10 reps - 2 sets - 1x daily - 7x weekly Supine Bridge - 10 reps - 3 sets - 1x daily - 7x weekly Prone Hip Extension with Bent Knee - 10 reps - 3 sets - 1x daily - 7x weekly     Supine L hip IR stretch with towel 30 seconds x 3   Reviewed and given as part of his HEP. Pt demonstrated and verbalized understanding.

## 2019-04-09 DIAGNOSIS — H524 Presbyopia: Secondary | ICD-10-CM | POA: Diagnosis not present

## 2019-05-07 ENCOUNTER — Other Ambulatory Visit: Payer: Self-pay | Admitting: Family Medicine

## 2019-05-07 DIAGNOSIS — K219 Gastro-esophageal reflux disease without esophagitis: Secondary | ICD-10-CM

## 2019-05-26 DIAGNOSIS — Z0100 Encounter for examination of eyes and vision without abnormal findings: Secondary | ICD-10-CM | POA: Diagnosis not present

## 2019-06-11 ENCOUNTER — Telehealth: Payer: Medicare HMO | Admitting: Family

## 2019-06-11 DIAGNOSIS — J019 Acute sinusitis, unspecified: Secondary | ICD-10-CM | POA: Diagnosis not present

## 2019-06-11 MED ORDER — AMOXICILLIN-POT CLAVULANATE 875-125 MG PO TABS: 1.0000 | ORAL_TABLET | Freq: Two times a day (BID) | ORAL | 0 refills | Status: DC

## 2019-06-11 NOTE — Progress Notes (Signed)

## 2019-07-16 ENCOUNTER — Other Ambulatory Visit: Payer: Self-pay

## 2019-07-16 ENCOUNTER — Encounter: Payer: Self-pay | Admitting: Family Medicine

## 2019-07-16 ENCOUNTER — Ambulatory Visit (INDEPENDENT_AMBULATORY_CARE_PROVIDER_SITE_OTHER): Payer: Medicare HMO | Admitting: Family Medicine

## 2019-07-16 VITALS — BP 124/71 | HR 66 | Temp 97.1°F | Ht 70.0 in | Wt 190.0 lb

## 2019-07-16 DIAGNOSIS — R109 Unspecified abdominal pain: Secondary | ICD-10-CM | POA: Diagnosis not present

## 2019-07-16 DIAGNOSIS — K219 Gastro-esophageal reflux disease without esophagitis: Secondary | ICD-10-CM | POA: Diagnosis not present

## 2019-07-16 DIAGNOSIS — R1011 Right upper quadrant pain: Secondary | ICD-10-CM

## 2019-07-16 DIAGNOSIS — K8020 Calculus of gallbladder without cholecystitis without obstruction: Secondary | ICD-10-CM

## 2019-07-16 LAB — POCT URINALYSIS DIPSTICK
Bilirubin, UA: NEGATIVE
Glucose, UA: NEGATIVE
Ketones, UA: NEGATIVE
Leukocytes, UA: NEGATIVE
Nitrite, UA: NEGATIVE
Protein, UA: NEGATIVE
Spec Grav, UA: 1.01 (ref 1.010–1.025)
Urobilinogen, UA: 0.2 E.U./dL
pH, UA: 6 (ref 5.0–8.0)

## 2019-07-16 MED ORDER — SUCRALFATE 1 G PO TABS: 1.0000 g | ORAL_TABLET | Freq: Three times a day (TID) | ORAL | 0 refills | Status: DC

## 2019-07-16 NOTE — Progress Notes (Signed)
Subjective:    Patient ID: Austin Stewart, male    DOB: 08/24/1955, 64 y.o.   MRN: [id number]  Austin Stewart is a 64 y.o. male presenting on 07/16/2019 for Abdominal Pain (Right side mid abdominal pain that radaites around the back, with softer bm and more acid reflux. The discomfort is worsen with certain food choices. x 2 mths )  Patient presents for a same day appointment.  HPI   R Sided Colicky Abdominal Pain Reports symptoms overall present for past 2 months - initial onset with R sided mid to upper abdominal pain, thought it was linked to fatty meats or dairy products. Seems to be intermittent, he was able to go several weeks pain free without episode. Now currently has acute flare that has persisted for past 3 days. Describes R sided mid abdominal pain with some radiation to R back area. He said Sunday night ate hot dog french fries french onion and it triggered it next day - Admits soft BM, slightly dark but well formed pieces. - Admits increased acid reflux, gas with burping and acidic taste. - He bent over today and felt sudden nausea but nothing came back up - Not taken any OTC medications - He admits still has appetite - He still has gallbladder and appendix. Has not had abdominal surgery except hernia - Denies fevers chills sweats, cough vomiting, constipation, blood in stool  Past Surgical History:  Procedure Laterality Date  . CARDIAC CATHETERIZATION  2006   [facility]   . FOOT SURGERY  03/2017   right great toe Dr.Hyatt   . GREEN LIGHT LASER TURP (TRANSURETHRAL RESECTION OF PROSTATE N/A 01/15/2018   Procedure: GREEN LIGHT LASER TURP (TRANSURETHRAL RESECTION OF PROSTATE;  Surgeon: Orson Ape, MD;  Location: [facility] ORS;  Service: Urology;  Laterality: N/A;  . HERNIA REPAIR    . MITRAL VALVE REPAIR  2006  . rotator cuff repair    . SHOULDER ARTHROSCOPY Left 08/24/2016   Procedure: ARTHROSCOPY SHOULDER with debridement;  Surgeon: Juanell Fairly, MD;  Location: [facility]  ORS;  Service: Orthopedics;  Laterality: Left;  . TONSILLECTOMY       Depression screen [facility] 2/9 02/18/2019 01/28/2019 07/17/2018  Decreased Interest 1 0 0  Down, Depressed, Hopeless 0 0 0  PHQ - 2 Score 1 0 0  Altered sleeping 0 - -  Tired, decreased energy 0 - -  Change in appetite 0 - -  Feeling bad or failure about yourself  0 - -  Trouble concentrating 0 - -  Moving slowly or fidgety/restless 0 - -  Suicidal thoughts 0 - -  PHQ-9 Score 1 - -  Difficult doing work/chores Not difficult at all - -  Some encounter information is confidential and restricted. Go to Review Flowsheets activity to see all data.    Social History   Tobacco Use  . Smoking status: Never Smoker  . Smokeless tobacco: Never Used  Substance Use Topics  . Alcohol use: No  . Drug use: No    Review of Systems Per HPI unless specifically indicated above     Objective:    BP 124/71 (BP Location: Left Arm, Patient Position: Sitting, Cuff Size: Normal)   Pulse 66   Temp (!) 97.1 F (36.2 C) (Temporal)   Ht 5\' 10"  (1.778 m)   Wt 190 lb (86.2 kg)   BMI 27.26 kg/m   Wt Readings from Last 3 Encounters:  07/16/19 190 lb (86.2 kg)  02/18/19 189 lb 9.6  oz (86 kg)  11/11/18 190 lb (86.2 kg)    Physical Exam Vitals and nursing note reviewed.  Constitutional:      General: He is not in acute distress.    Appearance: He is well-developed. He is not diaphoretic.     Comments: Well-appearing, comfortable, cooperative  HENT:     Head: Normocephalic and atraumatic.  Eyes:     General:        Right eye: No discharge.        Left eye: No discharge.     Conjunctiva/sclera: Conjunctivae normal.  Neck:     Thyroid: No thyromegaly.  Cardiovascular:     Rate and Rhythm: Normal rate and regular rhythm.     Heart sounds: Normal heart sounds. No murmur.  Pulmonary:     Effort: Pulmonary effort is normal. No respiratory distress.     Breath sounds: Normal breath sounds. No wheezing or rales.  Abdominal:      General: Bowel sounds are normal. There is no distension.     Palpations: Abdomen is soft. There is no hepatomegaly or splenomegaly.     Tenderness: There is abdominal tenderness in the right upper quadrant. There is no right CVA tenderness, left CVA tenderness, guarding or rebound. Positive signs include Murphy's sign. Negative signs include Rovsing's sign and McBurney's sign.  Musculoskeletal:        General: Normal range of motion.     Cervical back: Normal range of motion and neck supple.  Lymphadenopathy:     Cervical: No cervical adenopathy.  Skin:    General: Skin is warm and dry.     Findings: No erythema or rash.  Neurological:     Mental Status: He is alert and oriented to person, place, and time.  Psychiatric:        Behavior: Behavior normal.     Comments: Well groomed, good eye contact, normal speech and thoughts      Results for orders placed or performed in visit on 07/16/19  POCT Urinalysis Dipstick  Result Value Ref Range   Color, UA yelow    Clarity, UA clear    Glucose, UA Negative Negative   Bilirubin, UA neg    Ketones, UA neg    Spec Grav, UA 1.010 1.010 - 1.025   Blood, UA trace    pH, UA 6.0 5.0 - 8.0   Protein, UA Negative Negative   Urobilinogen, UA 0.2 0.2 or 1.0 E.U./dL   Nitrite, UA neg    Leukocytes, UA Negative Negative   Appearance     Odor none       Assessment & Plan:   Problem List Items Addressed This Visit    GERD without esophagitis   Relevant Medications   sucralfate (CARAFATE) 1 g tablet    Other Visit Diagnoses    Colicky RUQ abdominal pain    -  Primary   Relevant Medications   sucralfate (CARAFATE) 1 g tablet   Other Relevant Orders   US ABDOMEN LIMITED RUQ   Acute right flank pain       Relevant Orders   POCT Urinalysis Dipstick      Suspected acute biliary colick with likely cholelithiasis RUQ abdominal pain based on current clinical course  Reassuring pain is improving overall but still has persistent symptoms. He  has modified diet to avoid triggers fried fatty foods  Differential includes - Possible but less likely given symptoms Nephrolithiasis, has some trace blood on UA dipstick but seems to have  lack of all other urinary symptoms and pain is not consistent with kidney stone - Possible component of GERD flare up as well with assoc reflux symptoms worse - Unlikely appendicitis given duration 2 months episodic with weeks between symptoms, exam is benign abdomen, not concerning today and also has good appetite, no fevers or other red flag symptoms  - No other significant GI red flags (no unintentional wt loss, night-sweats, refractory abdominal pain n/v, hematemesis or melena).  Plan: 1. Discussed differential - will start with work-up for RUQ GB - ordered STAT abdominal US, scheduled for tomorrow 07/17/19 - Defer labs today, we can reconsider if indicated 2. Empiric trial on Carafate PRN pain 3. Continue Nexium PPI 20mg  - now increase to BID temporarily to see if helpful 4. Diet modifications Cholelithiasis/GERD given Return criteria reviewed  Can go to hospital ED if acute worsening. Future consider refer to Surgeon  If RUQ is negative for cholelithiasis or biliary cause of symptoms, we can broaden our search and consider [facility] x-ray vs other imaging for possible nephrolithiasis or other cause. May add basic blood panel otherwise labs due in 3-4 weeks anyway for physical.  Orders Placed This Encounter  Procedures  . US ABDOMEN LIMITED RUQ    Standing Status:   Future    Standing Expiration Date:   09/14/2020    Order Specific Question:   Reason for exam:    Answer:   episodic RUQ abdominal pain nausea, concern for cholelithiasis biliary colick    Order Specific Question:   Preferred imaging location?    Answer:   [facility]  . POCT Urinalysis Dipstick      Meds ordered this encounter  Medications  . sucralfate (CARAFATE) 1 g tablet    Sig: Take 1 tablet (1 g total) by mouth 4  (four) times daily -  with meals and at bedtime. As needed    Dispense:  40 tablet    Refill:  0     Follow up plan: Return in about 1 week (around 07/23/2019), or if symptoms worsen or fail to improve, for RUQ abdominal pain.  07/25/2019, DO [facility] 07/16/2019, 10:15 AM

## 2019-07-16 NOTE — Patient Instructions (Addendum)
Thank you for coming to the office today.  I am not sure the exact cause of your abdominal pain, however I am concerned that one significant possibility could be - Gallstones, this can cause episodic gallbladder pain that you may be describing - Potentially uncontrolled Acid Reflux (GERD) and may have developed an Ulcer (Peptic Ulcer of stomach) - this seems less likely to me  We will schedule a RUQ Abdominal Ultrasound for evaluating gallbladder within a week approx, this is done at [facility] or Outpatient Imaging, you will be called with apt.  It was ordered STAT hopefully they can call today. They can tell you how long it has to be without eating before this test, likely 4 hours  Take other prescribed medicine Carafate (Sucralfate) as needed up to 4 times daily (3 meals and bedtime)  to coat stomach lining to ease symptoms, if it helps reduce symptoms then it is more likely to be due to acid and/or ulcer.  Use Nexium TWICE a day for now  DIET RECOMMENDATIONS - Avoid spicy, greasy, fried foods, also things like caffeine, dark chocolate, peppermint can worsen - Avoid large meals and late night snacks, also do not go more than 4-5 hours without a snack or meal (not eating will worsen reflux symptoms due to stomach acid) - You may also elevate the head of your bed at night to sleep at very slight incline to help reduce symptoms  If symptoms are worsening or persistent despite treatment or develop any different severe esophagus or abdominal pain, unable to swallow solids or liquids, nausea, vomiting especially blood in vomit, fever/chills, or unintentional weight loss / no appetite, please follow-up sooner in office or seek more immediate medical attention at hospital Emergency Department.  Regarding other medicines:  - STOP taking Ibuprofen, Advil, Motrin, Goody's / BC powder - DO NOT take without discussing with your doctor. These medicines can put you at high risk for future bleeding.  If  need pain medicine, may take Tylenol Extra Strength (Acetaminophen) 500mg  tabs - take 1 to 2 tabs per dose (max 1000mg ) every 6-8 hours for pain (take regularly, don't skip a dose for next 7 days), max 24 hour daily dose is 6 tablets or 3000mg . In the future you can repeat the same everyday Tylenol course for 1-2 weeks at a time.     Please schedule a Follow-up Appointment to: Return in about 1 week (around 07/23/2019), or if symptoms worsen or fail to improve, for RUQ abdominal pain.  If you have any other questions or concerns, please feel free to call the office or send a message through MyChart. You may also schedule an earlier appointment if necessary.  Additionally, you may be receiving a survey about your experience at our office within a few days to 1 week by e-mail or mail. We value your feedback.  , DO [facility], 07/25/2019

## 2019-07-17 ENCOUNTER — Ambulatory Visit
Admission: RE | Admit: 2019-07-17 | Discharge: 2019-07-17 | Disposition: A | Discharge status: Home / Self Care | Payer: Medicare HMO | Source: Ambulatory Visit | Attending: Family Medicine | Admitting: Family Medicine

## 2019-07-17 ENCOUNTER — Other Ambulatory Visit: Payer: Self-pay | Admitting: Family Medicine

## 2019-07-17 DIAGNOSIS — K760 Fatty (change of) liver, not elsewhere classified: Secondary | ICD-10-CM | POA: Diagnosis not present

## 2019-07-17 DIAGNOSIS — J302 Other seasonal allergic rhinitis: Secondary | ICD-10-CM

## 2019-07-17 DIAGNOSIS — K8020 Calculus of gallbladder without cholecystitis without obstruction: Secondary | ICD-10-CM | POA: Diagnosis not present

## 2019-07-17 DIAGNOSIS — R1011 Right upper quadrant pain: Secondary | ICD-10-CM | POA: Diagnosis not present

## 2019-07-17 NOTE — Addendum Note (Signed)
Addended by: Smitty Cords on: 07/17/2019 01:05 PM   Modules accepted: Orders

## 2019-07-24 ENCOUNTER — Telehealth: Payer: Self-pay | Admitting: Cardiovascular Disease

## 2019-07-24 ENCOUNTER — Other Ambulatory Visit: Payer: Self-pay

## 2019-07-24 ENCOUNTER — Ambulatory Visit: Payer: Medicare HMO | Admitting: General Surgery

## 2019-07-24 ENCOUNTER — Encounter: Payer: Self-pay | Admitting: General Surgery

## 2019-07-24 VITALS — BP 126/89 | HR 67 | Temp 97.5°F | Resp 14 | Ht 70.0 in | Wt 188.4 lb

## 2019-07-24 DIAGNOSIS — K8020 Calculus of gallbladder without cholecystitis without obstruction: Secondary | ICD-10-CM | POA: Diagnosis not present

## 2019-07-24 NOTE — Progress Notes (Signed)
Patient ID: Austin Stewart, male   DOB: 08-12-1955, 64 y.o.   MRN: [id number]  Chief Complaint  Patient presents with  . New Patient (Initial Visit)    gallbladder    HPI Austin Stewart is a 64 y.o. male.   He has been referred by his primary care provider, Dr. Parks Ranger, for further evaluation of cholelithiasis.  According to the patient, he has had chronic issues with dyspepsia and diarrhea with fried or fatty foods.  He was started on Carafate which seemed to help the dyspepsia.  2 weeks ago, however, he began having worsening pain in his right upper quadrant.  The pain radiates around to his back.  It is strongly associated with fatty food intake.  These episodes are sometimes accompanied by nausea.  He has never had jaundice, acholic stools, or dark, tea-colored urine.  He had a right upper quadrant ultrasound that demonstrated cholelithiasis without cholecystitis.  He has been referred for further evaluation and management.   Past Medical History:  Diagnosis Date  . Allergic rhinitis, cause unspecified   . Allergy   . CHF (congestive heart failure) ([place])   . Diverticulosis of colon (without mention of hemorrhage)   . Esophageal reflux   . Measles without mention of complication   . Mumps without mention of complication   . 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 Paroxysmal SVT (supraventricular tachycardia) ([facility])     Past Surgical History:  Procedure Laterality Date  . CARDIAC CATHETERIZATION  2006   [facility]   . FOOT SURGERY  03/2017   right great toe Dr.Hyatt   . GREEN LIGHT LASER TURP (TRANSURETHRAL RESECTION OF PROSTATE N/A 01/15/2018   Procedure: GREEN LIGHT LASER TURP (TRANSURETHRAL RESECTION OF PROSTATE;  Surgeon: Royston Cowper, MD;  Location: [facility] ORS;  Service: Urology;  Laterality: N/A;  . HERNIA REPAIR    . MITRAL VALVE REPAIR  2006  . rotator cuff repair    . SHOULDER ARTHROSCOPY Left 08/24/2016   Procedure: ARTHROSCOPY  SHOULDER with debridement;  Surgeon: Thornton Park, MD;  Location: [facility] ORS;  Service: Orthopedics;  Laterality: Left;  . TONSILLECTOMY      Family History  Problem Relation Age of Onset  . Heart disease Mother        s/p stent placement  . Hypertension Mother   . Gallbladder disease Mother   . Emphysema Father   . Dementia Father   . Osteoporosis Father   . Cancer Maternal Uncle        kidney cancer    Social History Social History   Tobacco Use  . Smoking status: Never Smoker  . Smokeless tobacco: Never Used  Substance Use Topics  . Alcohol use: No  . Drug use: No    Allergies  Allergen Reactions  . Peach [Prunus Persica] Swelling    Facial swelling    Current Outpatient Medications  Medication Sig Dispense Refill  . aspirin EC 81 MG tablet Take 1 tablet (81 mg total) by mouth daily. 90 tablet 3  . baclofen (LIORESAL) 10 MG tablet Take 0.5-1 tablets (5-10 mg total) by mouth at bedtime as needed for muscle spasms. 30 each 1  . esomeprazole (NEXIUM) 20 MG capsule TAKE 1 CAPSULE (20 MG TOTAL) BY MOUTH DAILY BEFORE BREAKFAST  **REPLACES OMEPRAZOLE** 90 capsule 1  . flecainide (TAMBOCOR) 50 MG tablet Take 1 tablet (50 mg total) by mouth 2 (two) times daily. 180 tablet 3  .  fluticasone (FLONASE) 50 MCG/ACT nasal spray PLACE 2 SPRAYS INTO BOTH NOSTRILS DAILY 48 g 3  . levocetirizine (XYZAL) 5 MG tablet TAKE 1 TABLET EVERY EVENING 90 tablet 3  . meloxicam (MOBIC) 15 MG tablet TAKE 1 TABLET (15 MG TOTAL) BY MOUTH DAILY AS NEEDED FOR PAIN. UP TO 1-2 WEEKS MAX THEN REDUCE 90 tablet 1  . metoprolol succinate (TOPROL-XL) 25 MG 24 hr tablet Take 0.5 tablets (12.5 mg total) by mouth daily. 45 tablet 3  . Multiple Vitamins-Minerals (MULTIVITAMIN WITH MINERALS) tablet Take 1 tablet by mouth daily.    . OVER THE COUNTER MEDICATION Place 20 drops under the tongue 2 (two) times daily. CBD Oil    . rosuvastatin (CRESTOR) 5 MG tablet Take 1 tablet (5 mg total) by mouth every other day. 45  tablet 3  . sucralfate (CARAFATE) 1 g tablet Take 1 tablet (1 g total) by mouth 4 (four) times daily -  with meals and at bedtime. As needed 40 tablet 0  . Turmeric 500 MG CAPS Take 500 mg by mouth 2 (two) times daily.      No current facility-administered medications for this visit.    Review of Systems Review of Systems  Gastrointestinal: Positive for abdominal pain, diarrhea and nausea.  All other systems reviewed and are negative.   Blood pressure 126/89, pulse 67, temperature (!) 97.5 F (36.4 C), resp. rate 14, height 5' 10" (1.778 m), weight 188 lb 6.4 oz (85.5 kg), SpO2 98 %. Body mass index is 27.03 kg/m.  Physical Exam Physical Exam Constitutional:      General: He is not in acute distress.    Appearance: Normal appearance.  HENT:     Head: Normocephalic and atraumatic.     Nose:     Comments: Covered with a mask secondary to COVID-19 precautions    Mouth/Throat:     Comments: Covered with a mask secondary to COVID-19 precautions Eyes:     General: No scleral icterus.       Right eye: No discharge.        Left eye: No discharge.  Neck:     Comments: Thyromegaly or palpable thyroid masses appreciated. Cardiovascular:     Rate and Rhythm: Normal rate and regular rhythm.     Pulses: Normal pulses.  Pulmonary:     Effort: Pulmonary effort is normal. No respiratory distress.     Breath sounds: Normal breath sounds.  Abdominal:     General: Abdomen is flat. Bowel sounds are normal.     Palpations: Abdomen is soft.     Tenderness: There is no abdominal tenderness.     Comments: Murphy sign is negative  Genitourinary:    Comments: Deferred Musculoskeletal:        General: No swelling or tenderness.  Lymphadenopathy:     Cervical: No cervical adenopathy.  Skin:    General: Skin is warm and dry.  Neurological:     General: No focal deficit present.     Mental Status: He is alert and oriented to person, place, and time.  Psychiatric:        Mood and Affect:  Mood normal.        Behavior: Behavior normal.     Data Reviewed The most recent labs available are from 1 year ago.  These are copied here and did not show any sign of biliary obstruction. Results for Mcerlean, Monish D "BOB" (MRN [id number]) as of 07/24/2019 11:52  Ref. Range 07/10/2018 07:59  Sodium   Latest Ref Range: 135 - 146 mmol/L 142  Potassium Latest Ref Range: 3.5 - 5.3 mmol/L 4.7  Chloride Latest Ref Range: 98 - 110 mmol/L 107  CO2 Latest Ref Range: 20 - 32 mmol/L 27  Glucose Latest Ref Range: 65 - 99 mg/dL 94  Mean Plasma Glucose Latest Units: (calc) 108  BUN Latest Ref Range: 7 - 25 mg/dL 22  Creatinine Latest Ref Range: 0.70 - 1.25 mg/dL 5.57  Calcium Latest Ref Range: 8.6 - 10.3 mg/dL 9.8  BUN/Creatinine Ratio Latest Ref Range: 6 - 22 (calc) NOT APPLICABLE  AG Ratio Latest Ref Range: 1.0 - 2.5 (calc) 2.2  AST Latest Ref Range: 10 - 35 U/L 25  ALT Latest Ref Range: 9 - 46 U/L 27  Total Protein Latest Ref Range: 6.1 - 8.1 g/dL 6.7  Total Bilirubin Latest Ref Range: 0.2 - 1.2 mg/dL 0.8  GFR, Est Non African American Latest Ref Range: > OR = 60 mL/min/1.55m2 65  GFR, Est African American Latest Ref Range: > OR = 60 mL/min/1.66m2 76  Total CHOL/HDL Ratio Latest Ref Range: <5.0 (calc) 2.0  Cholesterol Latest Ref Range: <200 mg/dL 322  HDL Cholesterol Latest Ref Range: > OR = 40 mg/dL 58  LDL Cholesterol (Calc) Latest Units: mg/dL (calc) 42  Non-HDL Cholesterol (Calc) Latest Ref Range: <130 mg/dL (calc) 57  Triglycerides Latest Ref Range: <150 mg/dL 73  Alkaline phosphatase (APISO) Latest Ref Range: 35 - 144 U/L 76  Globulin Latest Ref Range: 1.9 - 3.7 g/dL (calc) 2.1  WBC Latest Ref Range: 3.8 - 10.8 Thousand/uL 6.4  RBC Latest Ref Range: 4.20 - 5.80 Million/uL 5.07  Hemoglobin Latest Ref Range: 13.2 - 17.1 g/dL 02.5  HCT Latest Ref Range: 38.5 - 50.0 % 44.8  MCV Latest Ref Range: 80.0 - 100.0 fL 88.4  MCH Latest Ref Range: 27.0 - 33.0 pg 30.2  MCHC Latest Ref Range: 32.0 -  36.0 g/dL 42.7  RDW Latest Ref Range: 11.0 - 15.0 % 13.3  Platelets Latest Ref Range: 140 - 400 Thousand/uL 360  MPV Latest Ref Range: 7.5 - 12.5 fL 10.3   I reviewed the ultrasound performed 17 July 2019.  I concur with the radiologist report which is copied here:  CLINICAL DATA:  64 year Stewart male with history of episodic right upper quadrant abdominal pain and nausea.  EXAM: ULTRASOUND ABDOMEN LIMITED RIGHT UPPER QUADRANT  COMPARISON:  No priors.  FINDINGS: Gallbladder:  In the dependent portion of the gallbladder there is some echogenic material which demonstrates some mild posterior acoustic shadowing, compatible with small calculi, largest of which measures only 5 mm. Gallbladder is not distended. Gallbladder wall thickness is normal at 2.3 mm. No pericholecystic fluid. Per report from the sonographer, the patient did not exhibit a sonographic Murphy's sign on examination.  Common bile duct:  Diameter: 2 mm  Liver:  No focal lesion identified. Increased echogenicity throughout the hepatic parenchyma, indicative of hepatic steatosis. Portal vein is patent on color Doppler imaging with normal direction of blood flow towards the liver.  Other: None.  IMPRESSION: 1. Cholelithiasis without evidence of acute cholecystitis. 2. Hepatic steatosis.  I also reviewed the most recent note from his cardiologist, Dr. Mariah Milling.  This was on 11 November 2018.  He does have diastolic congestive heart failure and is on flecainide for paroxysmal supraventricular tachycardia.  Assessment This is a 84 year Stewart man who has right upper quadrant pain associated with nausea but seems to be brought on by injection of fatty foods.  These  symptoms are consistent with symptomatic cholelithiasis.  I recommended that he undergo laparoscopic cholecystectomy.  Plan I discussed the procedure in detail.  We discussed the risks and benefits of a laparoscopic cholecystectomy and possible  cholangiogram including, but not limited to: bleeding, infection, injury to surrounding structures such as the intestine or liver, bile leak, retained gallstones, need to convert to an open procedure, prolonged diarrhea, blood clots such as DVT, common bile duct injury, anesthesia risks, and possible need for additional procedures. The patient had the opportunity to ask any questions and these were answered to his satisfaction.  We will request cardiac clearance from Dr. Mariah Milling.  We will work on getting him scheduled at the soonest possible mutually available date for his surgery.  [name] 07/24/2019, 11:49 AM

## 2019-07-24 NOTE — H&P (View-Only) (Signed)
Patient ID: Austin Stewart, male   DOB: 08-12-1955, 64 y.o.   MRN: [id number]  Chief Complaint  Patient presents with  . New Patient (Initial Visit)    gallbladder    HPI Austin Stewart is a 64 y.o. male.   He has been referred by his primary care provider, Dr. Parks Stewart, for further evaluation of cholelithiasis.  According to the patient, he has had chronic issues with dyspepsia and diarrhea with fried or fatty foods.  He was started on Carafate which seemed to help the dyspepsia.  2 weeks ago, however, he began having worsening pain in his right upper quadrant.  The pain radiates around to his back.  It is strongly associated with fatty food intake.  These episodes are sometimes accompanied by nausea.  He has never had jaundice, acholic stools, or dark, tea-colored urine.  He had a right upper quadrant ultrasound that demonstrated cholelithiasis without cholecystitis.  He has been referred for further evaluation and management.   Past Medical History:  Diagnosis Date  . Allergic rhinitis, cause unspecified   . Allergy   . CHF (congestive heart failure) ([place])   . Diverticulosis of colon (without mention of hemorrhage)   . Esophageal reflux   . Measles without mention of complication   . Mumps without mention of complication   . 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 Paroxysmal SVT (supraventricular tachycardia) ([facility])     Past Surgical History:  Procedure Laterality Date  . CARDIAC CATHETERIZATION  2006   [facility]   . FOOT SURGERY  03/2017   right great toe Austin Stewart   . GREEN LIGHT LASER TURP (TRANSURETHRAL RESECTION OF PROSTATE N/A 01/15/2018   Procedure: GREEN LIGHT LASER TURP (TRANSURETHRAL RESECTION OF PROSTATE;  Surgeon: Austin Cowper, MD;  Location: [facility] ORS;  Service: Urology;  Laterality: N/A;  . HERNIA REPAIR    . MITRAL VALVE REPAIR  2006  . rotator cuff repair    . SHOULDER ARTHROSCOPY Left 08/24/2016   Procedure: ARTHROSCOPY  SHOULDER with debridement;  Surgeon: Austin Park, MD;  Location: [facility] ORS;  Service: Orthopedics;  Laterality: Left;  . TONSILLECTOMY      Family History  Problem Relation Age of Onset  . Heart disease Mother        s/p stent placement  . Hypertension Mother   . Gallbladder disease Mother   . Emphysema Father   . Dementia Father   . Osteoporosis Father   . Cancer Maternal Uncle        kidney cancer    Social History Social History   Tobacco Use  . Smoking status: Never Smoker  . Smokeless tobacco: Never Used  Substance Use Topics  . Alcohol use: No  . Drug use: No    Allergies  Allergen Reactions  . Peach [Prunus Persica] Swelling    Facial swelling    Current Outpatient Medications  Medication Sig Dispense Refill  . aspirin EC 81 MG tablet Take 1 tablet (81 mg total) by mouth daily. 90 tablet 3  . baclofen (LIORESAL) 10 MG tablet Take 0.5-1 tablets (5-10 mg total) by mouth at bedtime as needed for muscle spasms. 30 each 1  . esomeprazole (NEXIUM) 20 MG capsule TAKE 1 CAPSULE (20 MG TOTAL) BY MOUTH DAILY BEFORE BREAKFAST  **REPLACES OMEPRAZOLE** 90 capsule 1  . flecainide (TAMBOCOR) 50 MG tablet Take 1 tablet (50 mg total) by mouth 2 (two) times daily. 180 tablet 3  .  fluticasone (FLONASE) 50 MCG/ACT nasal spray PLACE 2 SPRAYS INTO BOTH NOSTRILS DAILY 48 g 3  . levocetirizine (XYZAL) 5 MG tablet TAKE 1 TABLET EVERY EVENING 90 tablet 3  . meloxicam (MOBIC) 15 MG tablet TAKE 1 TABLET (15 MG TOTAL) BY MOUTH DAILY AS NEEDED FOR PAIN. UP TO 1-2 WEEKS MAX THEN REDUCE 90 tablet 1  . metoprolol succinate (TOPROL-XL) 25 MG 24 hr tablet Take 0.5 tablets (12.5 mg total) by mouth daily. 45 tablet 3  . Multiple Vitamins-Minerals (MULTIVITAMIN WITH MINERALS) tablet Take 1 tablet by mouth daily.    Marland Kitchen OVER THE COUNTER MEDICATION Place 20 drops under the tongue 2 (two) times daily. CBD Oil    . rosuvastatin (CRESTOR) 5 MG tablet Take 1 tablet (5 mg total) by mouth every other day. 45  tablet 3  . sucralfate (CARAFATE) 1 g tablet Take 1 tablet (1 g total) by mouth 4 (four) times daily -  with meals and at bedtime. As needed 40 tablet 0  . Turmeric 500 MG CAPS Take 500 mg by mouth 2 (two) times daily.      No current facility-administered medications for this visit.    Review of Systems Review of Systems  Gastrointestinal: Positive for abdominal pain, diarrhea and nausea.  All other systems reviewed and are negative.   Blood pressure 126/89, pulse 67, temperature (!) 97.5 F (36.4 C), resp. rate 14, height 5\' 10"  (1.778 m), weight 188 lb 6.4 oz (85.5 kg), SpO2 98 %. Body mass index is 27.03 kg/m.  Physical Exam Physical Exam Constitutional:      General: He is not in acute distress.    Appearance: Normal appearance.  HENT:     Head: Normocephalic and atraumatic.     Nose:     Comments: Covered with a mask secondary to COVID-19 precautions    Mouth/Throat:     Comments: Covered with a mask secondary to COVID-19 precautions Eyes:     General: No scleral icterus.       Right eye: No discharge.        Left eye: No discharge.  Neck:     Comments: Thyromegaly or palpable thyroid masses appreciated. Cardiovascular:     Rate and Rhythm: Normal rate and regular rhythm.     Pulses: Normal pulses.  Pulmonary:     Effort: Pulmonary effort is normal. No respiratory distress.     Breath sounds: Normal breath sounds.  Abdominal:     General: Abdomen is flat. Bowel sounds are normal.     Palpations: Abdomen is soft.     Tenderness: There is no abdominal tenderness.     Comments: sign is negative  Genitourinary:    Comments: Deferred Musculoskeletal:        General: No swelling or tenderness.  Lymphadenopathy:     Cervical: No cervical adenopathy.  Skin:    General: Skin is warm and dry.  Neurological:     General: No focal deficit present.     Mental Status: He is alert and oriented to person, place, and time.  Psychiatric:        Mood and Affect:  Mood normal.        Behavior: Behavior normal.     Data Reviewed The most recent labs available are from 1 year ago.  These are copied here and did not show any sign of biliary obstruction. Results for Austin, Stewart (MRN Austin Sleigh) as of 07/24/2019 11:52  Ref. Range 07/10/2018 07:59  Sodium  Latest Ref Range: 135 - 146 mmol/L 142  Potassium Latest Ref Range: 3.5 - 5.3 mmol/L 4.7  Chloride Latest Ref Range: 98 - 110 mmol/L 107  CO2 Latest Ref Range: 20 - 32 mmol/L 27  Glucose Latest Ref Range: 65 - 99 mg/dL 94  Mean Plasma Glucose Latest Units: (calc) 108  BUN Latest Ref Range: 7 - 25 mg/dL 22  Creatinine Latest Ref Range: 0.70 - 1.25 mg/dL 5.57  Calcium Latest Ref Range: 8.6 - 10.3 mg/dL 9.8  BUN/Creatinine Ratio Latest Ref Range: 6 - 22 (calc) NOT APPLICABLE  AG Ratio Latest Ref Range: 1.0 - 2.5 (calc) 2.2  AST Latest Ref Range: 10 - 35 U/L 25  ALT Latest Ref Range: 9 - 46 U/L 27  Total Protein Latest Ref Range: 6.1 - 8.1 g/dL 6.7  Total Bilirubin Latest Ref Range: 0.2 - 1.2 mg/dL 0.8  GFR, Est Non African American Latest Ref Range: > OR = 60 mL/min/1.55m2 65  GFR, Est African American Latest Ref Range: > OR = 60 mL/min/1.66m2 76  Total CHOL/HDL Ratio Latest Ref Range: <5.0 (calc) 2.0  Cholesterol Latest Ref Range: <200 mg/dL 322  HDL Cholesterol Latest Ref Range: > OR = 40 mg/dL 58  LDL Cholesterol (Calc) Latest Units: mg/dL (calc) 42  Non-HDL Cholesterol (Calc) Latest Ref Range: <130 mg/dL (calc) 57  Triglycerides Latest Ref Range: <150 mg/dL 73  Alkaline phosphatase (APISO) Latest Ref Range: 35 - 144 U/L 76  Globulin Latest Ref Range: 1.9 - 3.7 g/dL (calc) 2.1  WBC Latest Ref Range: 3.8 - 10.8 Thousand/uL 6.4  RBC Latest Ref Range: 4.20 - 5.80 Million/uL 5.07  Hemoglobin Latest Ref Range: 13.2 - 17.1 g/dL 02.5  HCT Latest Ref Range: 38.5 - 50.0 % 44.8  MCV Latest Ref Range: 80.0 - 100.0 fL 88.4  MCH Latest Ref Range: 27.0 - 33.0 pg 30.2  MCHC Latest Ref Range: 32.0 -  36.0 g/dL 42.7  RDW Latest Ref Range: 11.0 - 15.0 % 13.3  Platelets Latest Ref Range: 140 - 400 Thousand/uL 360  MPV Latest Ref Range: 7.5 - 12.5 fL 10.3   I reviewed the ultrasound performed 17 July 2019.  I concur with the radiologist report which is copied here:  CLINICAL DATA:  64 year Stewart male with history of episodic right upper quadrant abdominal pain and nausea.  EXAM: ULTRASOUND ABDOMEN LIMITED RIGHT UPPER QUADRANT  COMPARISON:  No priors.  FINDINGS: Gallbladder:  In the dependent portion of the gallbladder there is some echogenic material which demonstrates some mild posterior acoustic shadowing, compatible with small calculi, largest of which measures only 5 mm. Gallbladder is not distended. Gallbladder wall thickness is normal at 2.3 mm. No pericholecystic fluid. Per report from the sonographer, the patient did not exhibit a sonographic Murphy's sign on examination.  Common bile duct:  Diameter: 2 mm  Liver:  No focal lesion identified. Increased echogenicity throughout the hepatic parenchyma, indicative of hepatic steatosis. Portal vein is patent on color Doppler imaging with normal direction of blood flow towards the liver.  Other: None.  IMPRESSION: 1. Cholelithiasis without evidence of acute cholecystitis. 2. Hepatic steatosis.  I also reviewed the most recent note from his cardiologist, Dr. Mariah Milling.  This was on 11 November 2018.  He does have diastolic congestive heart failure and is on flecainide for paroxysmal supraventricular tachycardia.  Assessment This is a 84 year Stewart man who has right upper quadrant pain associated with nausea but seems to be brought on by injection of fatty foods.  These  symptoms are consistent with symptomatic cholelithiasis.  I recommended that he undergo laparoscopic cholecystectomy.  Plan I discussed the procedure in detail.  We discussed the risks and benefits of a laparoscopic cholecystectomy and possible  cholangiogram including, but not limited to: bleeding, infection, injury to surrounding structures such as the intestine or liver, bile leak, retained gallstones, need to convert to an open procedure, prolonged diarrhea, blood clots such as DVT, common bile duct injury, anesthesia risks, and possible need for additional procedures. The patient had the opportunity to ask any questions and these were answered to his satisfaction.  We will request cardiac clearance from Dr. Mariah Milling.  We will work on getting him scheduled at the soonest possible mutually available date for his surgery.  Austin Stewart 07/24/2019, 11:49 AM

## 2019-07-24 NOTE — Patient Instructions (Signed)
You have requested to have your gallbladder removed. This will be done at [facility] with Dr. Lady Gary.  You will most likely be out of work 1-2 weeks for this surgery. You will return after your post-op appointment with a lifting restriction for approximately 4 more weeks.  You will be able to eat anything you would like to following surgery. But, start by eating a bland diet and advance this as tolerated. The Gallbladder diet is below, please go as closely by this diet as possible prior to surgery to avoid any further attacks.  Please see the (blue)pre-care form that you have been given today. Our surgery scheduler will be in contact with you to look at surgery dates and go over Covid-19 testing information. If you have any questions, please call our office.   Laparoscopic Cholecystectomy Laparoscopic cholecystectomy is surgery to remove the gallbladder. The gallbladder is located in the upper right part of the abdomen, behind the liver. It is a storage sac for bile, which is produced in the liver. Bile aids in the digestion and absorption of fats. Cholecystectomy is often done for inflammation of the gallbladder (cholecystitis). This condition is usually caused by a buildup of gallstones (cholelithiasis) in the gallbladder. Gallstones can block the flow of bile, and that can result in inflammation and pain. In severe cases, emergency surgery may be required. If emergency surgery is not required, you will have time to prepare for the procedure. Laparoscopic surgery is an alternative to open surgery. Laparoscopic surgery has a shorter recovery time. Your common bile duct may also need to be examined during the procedure. If stones are found in the common bile duct, they may be removed. LET [facility] CARE PROVIDER KNOW ABOUT:  Any allergies you have.  All medicines you are taking, including vitamins, herbs, eye drops, creams, and over-the-counter medicines.  Previous problems you or members  of your family have had with the use of anesthetics.  Any blood disorders you have.  Previous surgeries you have had.    Any medical conditions you have. RISKS AND COMPLICATIONS Generally, this is a safe procedure. However, problems may occur, including:  Infection.  Bleeding.  Allergic reactions to medicines.  Damage to other structures or organs.  A stone remaining in the common bile duct.  A bile leak from the cyst duct that is clipped when your gallbladder is removed.  The need to convert to open surgery, which requires a larger incision in the abdomen. This may be necessary if your surgeon thinks that it is not safe to continue with a laparoscopic procedure. BEFORE THE PROCEDURE  Ask your health care provider about:  Changing or stopping your regular medicines. This is especially important if you are taking diabetes medicines or blood thinners.  Taking medicines such as aspirin and ibuprofen. These medicines can thin your blood. Do not take these medicines before your procedure if your health care provider instructs you not to.  Follow instructions from your health care provider about eating or drinking restrictions.  Let your health care provider know if you develop a cold or an infection before surgery.  Plan to have someone take you home after the procedure.  Ask your health care provider how your surgical site will be marked or identified.  You may be given antibiotic medicine to help prevent infection. PROCEDURE  To reduce your risk of infection:  Your health care team will wash or sanitize their hands.  Your skin will be washed with soap.  An  IV tube may be inserted into one of your veins.  You will be given a medicine to make you fall asleep (general anesthetic).  A breathing tube will be placed in your mouth.  The surgeon will make several small cuts (incisions) in your abdomen.  A thin, lighted tube (laparoscope) that has a tiny camera on the  end will be inserted through one of the small incisions. The camera on the laparoscope will send a picture to a TV screen (monitor) in the operating room. This will give the surgeon a good view inside your abdomen.  A gas will be pumped into your abdomen. This will expand your abdomen to give the surgeon more room to perform the surgery.  Other tools that are needed for the procedure will be inserted through the other incisions. The gallbladder will be removed through one of the incisions.  After your gallbladder has been removed, the incisions will be closed with stitches (sutures), staples, or skin glue.  Your incisions may be covered with a bandage (dressing). The procedure may vary among health care providers and hospitals. AFTER THE PROCEDURE  Your blood pressure, heart rate, breathing rate, and blood oxygen level will be monitored often until the medicines you were given have worn off.  You will be given medicines as needed to control your pain.   This information is not intended to replace advice given to you by your health care provider. Make sure you discuss any questions you have with your health care provider.   Document Released: 04/17/2005 Document Revised: 01/06/2015 Document Reviewed: 11/27/2012 Elsevier Interactive Patient Education 2016 [place] Diet for Gallbladder Conditions A low-fat diet can be helpful if you have pancreatitis or a gallbladder condition. With these conditions, your pancreas and gallbladder have trouble digesting fats. A healthy eating plan with less fat will help rest your pancreas and gallbladder and reduce your symptoms. WHAT DO I NEED TO KNOW ABOUT THIS DIET?  Eat a low-fat diet.  Reduce your fat intake to less than 20-30% of your total daily calories. This is less than 50-60 g of fat per day.  Remember that you need some fat in your diet. Ask your dietician what your daily goal should be.  Choose nonfat and low-fat healthy  foods. Look for the words "nonfat," "low fat," or "fat free."  As a guide, look on the label and choose foods with less than 3 g of fat per serving. Eat only one serving.  Avoid alcohol.  Do not smoke. If you need help quitting, talk with your health care provider.  Eat small frequent meals instead of three large heavy meals. WHAT FOODS CAN I EAT? Grains Include healthy grains and starches such as potatoes, wheat bread, fiber-rich cereal, and brown rice. Choose whole grain options whenever possible. In adults, whole grains should account for 45-65% of your daily calories.  Fruits and Vegetables Eat plenty of fruits and vegetables. Fresh fruits and vegetables add fiber to your diet. Meats and Other Protein Sources Eat lean meat such as chicken and pork. Trim any fat off of meat before cooking it. Eggs, fish, and beans are other sources of protein. In adults, these foods should account for 10-35% of your daily calories. Dairy Choose low-fat milk and dairy options. Dairy includes fat and protein, as well as calcium.  Fats and Oils Limit high-fat foods such as fried foods, sweets, baked goods, sugary drinks.  Other Creamy sauces and condiments, such as mayonnaise, can add extra  fat. Think about whether or not you need to use them, or use smaller amounts or low fat options. WHAT FOODS ARE NOT RECOMMENDED?  High fat foods, such as:  [employer].  Ice cream.  [place] toast.  Sweet rolls.  Pizza.  Cheese bread.  Foods covered with batter, butter, creamy sauces, or cheese.  Fried foods.  Sugary drinks and desserts.  Foods that cause gas or bloating   This information is not intended to replace advice given to you by your health care provider. Make sure you discuss any questions you have with your health care provider.   Document Released: 04/22/2013 Document Reviewed: 04/22/2013 Elsevier Interactive Patient Education [employer]

## 2019-07-24 NOTE — Telephone Encounter (Signed)
   [facility] Pre-operative Risk Assessment    Request for surgical clearance:  1. What type of surgery is being performed? Laparoscopic cholecystectomy   2. When is this surgery scheduled? 08/01/19  3. What type of clearance is required (medical clearance vs. Pharmacy clearance to hold med vs. Both)? medical  4. Are there any medications that need to be held prior to surgery and how long? None listed, please advise if needed  5. Practice name and name of physician performing surgery?  [facility], Dr Fredirick Maudlin  6. What is your office phone number [phone]   7.   What is your office fax number [phone]  8.   Anesthesia type (None, local, MAC, general) ? General    Austin Stewart 07/24/2019, 4:56 PM  _________________________________________________________________   (provider comments below)

## 2019-07-25 ENCOUNTER — Telehealth: Payer: Self-pay | Admitting: General Surgery

## 2019-07-25 NOTE — Telephone Encounter (Signed)
   Primary Cardiologist: Julien Nordmann, MD  Chart reviewed as part of pre-operative protocol coverage. Patient was contacted 07/25/2019 in reference to pre-operative risk assessment for pending surgery as outlined below.  HAJIME ASFAW was last seen on 11/11/2018 by Dr. Mariah Milling.  Since that day, YOVANI COGBURN has done well. He has no known hx of CAD. He has a history of mitral valve repair in 2006. Most recent echo in 09/2018 showed normal EF and moderate mitral regurgitation. He is able to achieve greater than 4 Mets of activity without any exertional chest discomfort or shortness of breath.   Therefore, based on ACC/AHA guidelines, the patient would be at acceptable risk for the planned procedure without further cardiovascular testing.   I will route this recommendation to the requesting party via Epic fax function and remove from pre-op pool.  Please call with questions.  Berton Bon, NP 07/25/2019, 2:40 PM

## 2019-07-25 NOTE — Telephone Encounter (Signed)
Pt has been advised of Pre-Admission date/time, COVID Testing date and Surgery date.  Surgery Date: 08/01/19 Preadmission Testing Date: 07/29/19 (phone 8a - 1p) Covid Testing Date: 07/30/19 - patient advised to [facility] ([address] [facility]) between 8a-1p  Patient has been made aware to call [phone], between 1-3:00pm the day before surgery, to find out what time to arrive for surgery.

## 2019-07-28 NOTE — Progress Notes (Signed)
Cardiac Clearance received from Dr Windell Hummingbird office. Patient is cleared at acceptable risk. Notes are in Epic.

## 2019-07-29 ENCOUNTER — Other Ambulatory Visit: Payer: Self-pay

## 2019-07-29 ENCOUNTER — Other Ambulatory Visit
Admission: RE | Admit: 2019-07-29 | Discharge: 2019-07-29 | Disposition: A | Discharge status: Home / Self Care | Payer: Medicare HMO | Source: Ambulatory Visit | Attending: General Surgery | Admitting: General Surgery

## 2019-07-29 ENCOUNTER — Other Ambulatory Visit: Payer: Medicare HMO

## 2019-07-29 DIAGNOSIS — M8949 Other hypertrophic osteoarthropathy, multiple sites: Secondary | ICD-10-CM | POA: Diagnosis not present

## 2019-07-29 DIAGNOSIS — R3914 Feeling of incomplete bladder emptying: Secondary | ICD-10-CM | POA: Diagnosis not present

## 2019-07-29 DIAGNOSIS — N401 Enlarged prostate with lower urinary tract symptoms: Secondary | ICD-10-CM | POA: Diagnosis not present

## 2019-07-29 DIAGNOSIS — R7303 Prediabetes: Secondary | ICD-10-CM | POA: Diagnosis not present

## 2019-07-29 DIAGNOSIS — E785 Hyperlipidemia, unspecified: Secondary | ICD-10-CM | POA: Diagnosis not present

## 2019-07-29 DIAGNOSIS — R7989 Other specified abnormal findings of blood chemistry: Secondary | ICD-10-CM | POA: Diagnosis not present

## 2019-07-29 DIAGNOSIS — Z0000 Encounter for general adult medical examination without abnormal findings: Secondary | ICD-10-CM | POA: Diagnosis not present

## 2019-07-29 NOTE — Patient Instructions (Addendum)
Your procedure is scheduled on: Friday, August 01, 2019 Report to Day Surgery on the 2nd floor of the [facility] To find out your arrival time, please call [phone] between 1PM - 3PM on: Thursday, July 31, 2019  REMEMBER: Instructions that are not followed completely may result in serious medical risk, up to and including death; or upon the discretion of your surgeon and anesthesiologist your surgery may need to be rescheduled.  Do not eat food after midnight the night before surgery.  No gum chewing, lozengers or hard candies.  You may however, drink CLEAR liquids up to 2 hours before you are scheduled to arrive for your surgery. Do not drink anything within 2 hours of the start of your surgery.  Clear liquids include: - water  - apple juice without pulp - gatorade (not RED) - black coffee or tea (Do NOT add milk or creamers to the coffee or tea) Do NOT drink anything that is not on this list.  TAKE THESE MEDICATIONS THE MORNING OF SURGERY WITH A SIP OF WATER:  1.  Nexium - (take one the night before and one on the morning of surgery - helps to prevent nausea after   surgery.) 2.  Flecainide 3.  Metoprolol 4.  Sucralfate (Carafate)  Follow recommendations from Cardiologist, Pulmonologist or PCP regarding stopping Aspirin.  Stop Anti-inflammatories (NSAIDS) such as MELOXICAM, Advil, Aleve, Ibuprofen, Motrin, Naproxen, Naprosyn and Aspirin based products such as Excedrin, Goodys Powder, BC Powder. (May take Tylenol or Acetaminophen if needed.)  Stop ANY OVER THE COUNTER supplements until after surgery. [facility]) (May continue multivitamin.)  No Alcohol for 24 hours before or after surgery.  On the morning of surgery brush your teeth with toothpaste and water, you may rinse your mouth with mouthwash if you wish. Do not swallow any toothpaste or mouthwash.  Do not wear jewelry.  Do not wear lotions, powders, or perfumes.   Do not shave 48 hours prior to surgery.   Do  not bring valuables to the hospital, including drivers license, insurance or credit cards.  [facility] is not responsible for any belongings or valuables.   Use CHG Soap as directed on instruction sheet..   Notify your doctor if there is any change in your medical condition (cold, fever, infection).  Wear comfortable clothing (specific to your surgery type) to the hospital.  If you are being discharged the day of surgery, you will not be allowed to drive home. You will need a responsible adult to drive you home and stay with you that night.   If you are taking public transportation, you will need to have a responsible adult with you. Please confirm with your physician that it is acceptable to use public transportation.   Please call [phone] if you have any questions about these instructions.  Visitation Policy:  Patients undergoing a surgery or procedure in a hospital may have one family member or support person with them as long as that person is not COVID-19 positive or experiencing its symptoms. That person may remain in the waiting area during the procedure. Should the patient need to stay at the hospital during part of their recovery, the support person may visit during visiting hours; 7 am to 8 pm.

## 2019-07-30 ENCOUNTER — Encounter
Admission: RE | Admit: 2019-07-30 | Discharge: 2019-07-30 | Disposition: A | Discharge status: Home / Self Care | Payer: Medicare HMO | Source: Ambulatory Visit | Attending: General Surgery | Admitting: General Surgery

## 2019-07-30 ENCOUNTER — Inpatient Hospital Stay: Admission: RE | Admit: 2019-07-30 | Payer: Medicare HMO | Source: Ambulatory Visit

## 2019-07-30 DIAGNOSIS — Z20822 Contact with and (suspected) exposure to covid-19: Secondary | ICD-10-CM | POA: Insufficient documentation

## 2019-07-30 DIAGNOSIS — Z01812 Encounter for preprocedural laboratory examination: Secondary | ICD-10-CM | POA: Diagnosis not present

## 2019-07-30 DIAGNOSIS — Z01810 Encounter for preprocedural cardiovascular examination: Secondary | ICD-10-CM | POA: Insufficient documentation

## 2019-07-30 LAB — CBC WITH DIFFERENTIAL/PLATELET
Absolute Monocytes: 410 cells/uL (ref 200–950)
Basophils Absolute: 68 cells/uL (ref 0–200)
Basophils Relative: 1.2 %
Eosinophils Absolute: 291 cells/uL (ref 15–500)
Eosinophils Relative: 5.1 %
HCT: 43.7 % (ref 38.5–50.0)
Hemoglobin: 14.4 g/dL (ref 13.2–17.1)
Lymphs Abs: 1186 cells/uL (ref 850–3900)
MCH: 29.8 pg (ref 27.0–33.0)
MCHC: 33 g/dL (ref 32.0–36.0)
MCV: 90.5 fL (ref 80.0–100.0)
MPV: 10.1 fL (ref 7.5–12.5)
Monocytes Relative: 7.2 %
Neutro Abs: 3745 cells/uL (ref 1500–7800)
Neutrophils Relative %: 65.7 %
Platelets: 339 10*3/uL (ref 140–400)
RBC: 4.83 10*6/uL (ref 4.20–5.80)
RDW: 13.2 % (ref 11.0–15.0)
Total Lymphocyte: 20.8 %
WBC: 5.7 10*3/uL (ref 3.8–10.8)

## 2019-07-30 LAB — COMPLETE METABOLIC PANEL WITH GFR
AG Ratio: 2.2 (calc) (ref 1.0–2.5)
ALT: 24 U/L (ref 9–46)
AST: 22 U/L (ref 10–35)
Albumin: 4.2 g/dL (ref 3.6–5.1)
Alkaline phosphatase (APISO): 87 U/L (ref 35–144)
BUN: 18 mg/dL (ref 7–25)
CO2: 26 mmol/L (ref 20–32)
Calcium: 9.5 mg/dL (ref 8.6–10.3)
Chloride: 110 mmol/L (ref 98–110)
Creat: 1.07 mg/dL (ref 0.70–1.25)
GFR, Est African American: 84 mL/min/{1.73_m2} (ref >=60)
GFR, Est Non African American: 72 mL/min/{1.73_m2} (ref >=60)
Globulin: 1.9 g/dL (calc) (ref 1.9–3.7)
Glucose, Bld: 102 mg/dL — ABNORMAL HIGH (ref 65–99)
Potassium: 4.4 mmol/L (ref 3.5–5.3)
Sodium: 143 mmol/L (ref 135–146)
Total Bilirubin: 0.8 mg/dL (ref 0.2–1.2)
Total Protein: 6.1 g/dL (ref 6.1–8.1)

## 2019-07-30 LAB — BASIC METABOLIC PANEL
Anion gap: 6 (ref 5–15)
BUN: 23 mg/dL (ref 8–23)
CO2: 26 mmol/L (ref 22–32)
Calcium: 9.1 mg/dL (ref 8.9–10.3)
Chloride: 110 mmol/L (ref 98–111)
Creatinine, Ser: 1.11 mg/dL (ref 0.61–1.24)
GFR calc Af Amer: >60 mL/min (ref >60)
GFR calc non Af Amer: >60 mL/min (ref >60)
Glucose, Bld: 97 mg/dL (ref 70–99)
Potassium: 4.2 mmol/L (ref 3.5–5.1)
Sodium: 142 mmol/L (ref 135–145)

## 2019-07-30 LAB — CBC
HCT: 42.2 % (ref 39.0–52.0)
Hemoglobin: 14.2 g/dL (ref 13.0–17.0)
MCH: 30 pg (ref 26.0–34.0)
MCHC: 33.6 g/dL (ref 30.0–36.0)
MCV: 89.2 fL (ref 80.0–100.0)
Platelets: 351 10*3/uL (ref 150–400)
RBC: 4.73 MIL/uL (ref 4.22–5.81)
RDW: 13.5 % (ref 11.5–15.5)
WBC: 7.3 10*3/uL (ref 4.0–10.5)
nRBC: 0 % (ref 0.0–0.2)

## 2019-07-30 LAB — LIPID PANEL
Cholesterol: 98 mg/dL (ref <200)
HDL: 41 mg/dL (ref >=40)
LDL Cholesterol (Calc): 39 mg/dL (calc)
Non-HDL Cholesterol (Calc): 57 mg/dL (calc) (ref <130)
Total CHOL/HDL Ratio: 2.4 (calc) (ref <5.0)
Triglycerides: 92 mg/dL (ref <150)

## 2019-07-30 LAB — SARS CORONAVIRUS 2 (TAT 6-24 HRS): SARS Coronavirus 2: NEGATIVE

## 2019-07-30 LAB — HEMOGLOBIN A1C
Hgb A1c MFr Bld: 5.2 % of total Hgb (ref <5.7)
Mean Plasma Glucose: 103 (calc)
eAG (mmol/L): 5.7 (calc)

## 2019-07-30 LAB — PSA: PSA: 0.4 ng/mL (ref <=4.0)

## 2019-08-01 ENCOUNTER — Ambulatory Visit: Payer: Medicare HMO | Admitting: Family

## 2019-08-01 ENCOUNTER — Other Ambulatory Visit: Payer: Self-pay

## 2019-08-01 ENCOUNTER — Encounter: Payer: Self-pay | Admitting: General Surgery

## 2019-08-01 ENCOUNTER — Encounter
Admission: RE | Disposition: A | Discharge status: Home / Self Care | Payer: Self-pay | Source: Home / Self Care | Attending: General Surgery

## 2019-08-01 ENCOUNTER — Ambulatory Visit
Admission: RE | Admit: 2019-08-01 | Discharge: 2019-08-01 | Disposition: A | Discharge status: Home / Self Care | Payer: Medicare HMO | Attending: General Surgery | Admitting: General Surgery

## 2019-08-01 DIAGNOSIS — K8080 Other cholelithiasis without obstruction: Secondary | ICD-10-CM | POA: Diagnosis not present

## 2019-08-01 DIAGNOSIS — J309 Allergic rhinitis, unspecified: Secondary | ICD-10-CM | POA: Insufficient documentation

## 2019-08-01 DIAGNOSIS — I471 Supraventricular tachycardia: Secondary | ICD-10-CM | POA: Insufficient documentation

## 2019-08-01 DIAGNOSIS — Z79899 Other long term (current) drug therapy: Secondary | ICD-10-CM | POA: Insufficient documentation

## 2019-08-01 DIAGNOSIS — K8020 Calculus of gallbladder without cholecystitis without obstruction: Secondary | ICD-10-CM

## 2019-08-01 DIAGNOSIS — Q249 Congenital malformation of heart, unspecified: Secondary | ICD-10-CM | POA: Diagnosis not present

## 2019-08-01 DIAGNOSIS — Z7982 Long term (current) use of aspirin: Secondary | ICD-10-CM | POA: Insufficient documentation

## 2019-08-01 DIAGNOSIS — K219 Gastro-esophageal reflux disease without esophagitis: Secondary | ICD-10-CM | POA: Insufficient documentation

## 2019-08-01 DIAGNOSIS — K8010 Calculus of gallbladder with chronic cholecystitis without obstruction: Secondary | ICD-10-CM | POA: Diagnosis not present

## 2019-08-01 HISTORY — PX: CHOLECYSTECTOMY: SHX55

## 2019-08-01 SURGERY — LAPAROSCOPIC CHOLECYSTECTOMY
Anesthesia: General | Site: Abdomen

## 2019-08-01 MED ORDER — EPHEDRINE SULFATE 50 MG/ML IJ SOLN
INTRAMUSCULAR | Status: DC | PRN
  Administered 2019-08-01: 10 mg via INTRAVENOUS
  Administered 2019-08-01: 5 mg via INTRAVENOUS
  Administered 2019-08-01: 10 mg via INTRAVENOUS

## 2019-08-01 MED ORDER — PROPOFOL 10 MG/ML IV BOLUS
INTRAVENOUS | Status: AC
  Filled 2019-08-01: qty 40

## 2019-08-01 MED ORDER — DEXAMETHASONE SODIUM PHOSPHATE 10 MG/ML IJ SOLN
INTRAMUSCULAR | Status: AC
  Filled 2019-08-01: qty 1

## 2019-08-01 MED ORDER — SEVOFLURANE IN SOLN
RESPIRATORY_TRACT | Status: AC
  Filled 2019-08-01: qty 250

## 2019-08-01 MED ORDER — CELECOXIB 200 MG PO CAPS: 200.0000 mg | ORAL_CAPSULE | ORAL | Status: AC

## 2019-08-01 MED ORDER — MIDAZOLAM HCL 2 MG/2ML IJ SOLN
INTRAMUSCULAR | Status: AC
  Filled 2019-08-01: qty 2

## 2019-08-01 MED ORDER — CELECOXIB 200 MG PO CAPS
ORAL_CAPSULE | ORAL | Status: AC
  Administered 2019-08-01: 200 mg via ORAL
  Filled 2019-08-01: qty 1

## 2019-08-01 MED ORDER — LACTATED RINGERS IV SOLN: INTRAVENOUS | Status: DC

## 2019-08-01 MED ORDER — PROMETHAZINE HCL 25 MG/ML IJ SOLN: 6.2500 mg | INTRAMUSCULAR | Status: DC | PRN

## 2019-08-01 MED ORDER — CHLORHEXIDINE GLUCONATE CLOTH 2 % EX PADS: 6.0000 | MEDICATED_PAD | Freq: Once | CUTANEOUS | Status: DC

## 2019-08-01 MED ORDER — CEFAZOLIN SODIUM-DEXTROSE 2-4 GM/100ML-% IV SOLN
INTRAVENOUS | Status: AC
  Filled 2019-08-01: qty 100

## 2019-08-01 MED ORDER — ONDANSETRON HCL 4 MG/2ML IJ SOLN
INTRAMUSCULAR | Status: AC
  Filled 2019-08-01: qty 2

## 2019-08-01 MED ORDER — LIDOCAINE-EPINEPHRINE 1 %-1:100000 IJ SOLN
INTRAMUSCULAR | Status: AC
  Filled 2019-08-01: qty 1

## 2019-08-01 MED ORDER — FENTANYL CITRATE (PF) 100 MCG/2ML IJ SOLN
INTRAMUSCULAR | Status: DC | PRN
  Administered 2019-08-01: 25 ug via INTRAVENOUS
  Administered 2019-08-01: 50 ug via INTRAVENOUS
  Administered 2019-08-01: 25 ug via INTRAVENOUS

## 2019-08-01 MED ORDER — ROCURONIUM BROMIDE 100 MG/10ML IV SOLN
INTRAVENOUS | Status: DC | PRN
  Administered 2019-08-01: 50 mg via INTRAVENOUS

## 2019-08-01 MED ORDER — ROCURONIUM BROMIDE 10 MG/ML (PF) SYRINGE
PREFILLED_SYRINGE | INTRAVENOUS | Status: AC
  Filled 2019-08-01: qty 10

## 2019-08-01 MED ORDER — PROPOFOL 10 MG/ML IV BOLUS
INTRAVENOUS | Status: DC | PRN
  Administered 2019-08-01: 160 mg via INTRAVENOUS

## 2019-08-01 MED ORDER — ACETAMINOPHEN 500 MG PO TABS
ORAL_TABLET | ORAL | Status: AC
  Administered 2019-08-01: 07:00:00 1000 mg via ORAL
  Filled 2019-08-01: qty 2

## 2019-08-01 MED ORDER — FENTANYL CITRATE (PF) 100 MCG/2ML IJ SOLN: 25.0000 ug | INTRAMUSCULAR | Status: DC | PRN

## 2019-08-01 MED ORDER — HYDROCODONE-ACETAMINOPHEN 5-325 MG PO TABS: 1.0000 | ORAL_TABLET | Freq: Four times a day (QID) | ORAL | 0 refills | Status: DC | PRN

## 2019-08-01 MED ORDER — GABAPENTIN 300 MG PO CAPS: 300.0000 mg | ORAL_CAPSULE | ORAL | Status: AC

## 2019-08-01 MED ORDER — LIDOCAINE HCL (CARDIAC) PF 100 MG/5ML IV SOSY
PREFILLED_SYRINGE | INTRAVENOUS | Status: DC | PRN
  Administered 2019-08-01: 60 mg via INTRAVENOUS

## 2019-08-01 MED ORDER — ONDANSETRON HCL 4 MG/2ML IJ SOLN
INTRAMUSCULAR | Status: DC | PRN
  Administered 2019-08-01: 4 mg via INTRAVENOUS

## 2019-08-01 MED ORDER — CEFAZOLIN SODIUM-DEXTROSE 2-4 GM/100ML-% IV SOLN
2.0000 g | INTRAVENOUS | Status: AC
  Administered 2019-08-01: 2 g via INTRAVENOUS

## 2019-08-01 MED ORDER — SUGAMMADEX SODIUM 200 MG/2ML IV SOLN
INTRAVENOUS | Status: DC | PRN
  Administered 2019-08-01: 100 mg via INTRAVENOUS

## 2019-08-01 MED ORDER — ACETAMINOPHEN 500 MG PO TABS: 1000.0000 mg | ORAL_TABLET | ORAL | Status: AC

## 2019-08-01 MED ORDER — GABAPENTIN 300 MG PO CAPS
ORAL_CAPSULE | ORAL | Status: AC
  Administered 2019-08-01: 300 mg via ORAL
  Filled 2019-08-01: qty 1

## 2019-08-01 MED ORDER — DEXAMETHASONE SODIUM PHOSPHATE 10 MG/ML IJ SOLN
INTRAMUSCULAR | Status: DC | PRN
  Administered 2019-08-01: 10 mg via INTRAVENOUS

## 2019-08-01 MED ORDER — BUPIVACAINE HCL (PF) 0.25 % IJ SOLN
INTRAMUSCULAR | Status: AC
  Filled 2019-08-01: qty 30

## 2019-08-01 MED ORDER — PHENYLEPHRINE HCL (PRESSORS) 10 MG/ML IV SOLN
INTRAVENOUS | Status: DC | PRN
  Administered 2019-08-01: 100 ug via INTRAVENOUS

## 2019-08-01 MED ORDER — MIDAZOLAM HCL 2 MG/2ML IJ SOLN
INTRAMUSCULAR | Status: DC | PRN
  Administered 2019-08-01: 2 mg via INTRAVENOUS

## 2019-08-01 MED ORDER — FENTANYL CITRATE (PF) 100 MCG/2ML IJ SOLN
INTRAMUSCULAR | Status: AC
  Filled 2019-08-01: qty 2

## 2019-08-01 MED ORDER — LIDOCAINE-EPINEPHRINE 1 %-1:100000 IJ SOLN
INTRAMUSCULAR | Status: DC | PRN
  Administered 2019-08-01: 10 mL via INTRAMUSCULAR

## 2019-08-01 SURGICAL SUPPLY — 50 items
"PENCIL ELECTRO HAND CTR " (MISCELLANEOUS) ×1 IMPLANT
APPLIER CLIP 5 13 M/L LIGAMAX5 (MISCELLANEOUS) ×2
BLADE CLIPPER SURG (BLADE) ×1 IMPLANT
BLADE SURG SZ11 CARB STEEL (BLADE) ×2 IMPLANT
CANISTER SUCT 1200ML W/VALVE (MISCELLANEOUS) ×2 IMPLANT
CATH CHOLANGI 4FR 420404F (CATHETERS) IMPLANT
CHLORAPREP W/TINT 26 (MISCELLANEOUS) ×2 IMPLANT
CLIP APPLIE 5 13 M/L LIGAMAX5 (MISCELLANEOUS) ×1 IMPLANT
COVER WAND RF STERILE (DRAPES) ×2 IMPLANT
DECANTER SPIKE VIAL GLASS SM (MISCELLANEOUS) ×4 IMPLANT
DEFOGGER SCOPE WARMER CLEARIFY (MISCELLANEOUS) ×2 IMPLANT
DERMABOND ADVANCED (GAUZE/BANDAGES/DRESSINGS) ×1
DERMABOND ADVANCED .7 DNX12 (GAUZE/BANDAGES/DRESSINGS) ×1 IMPLANT
ELECT CAUTERY BLADE TIP 2.5 (TIP) ×2
ELECT REM PT RETURN 9FT ADLT (ELECTROSURGICAL) ×2
ELECTRODE CAUTERY BLDE TIP 2.5 (TIP) ×1 IMPLANT
ELECTRODE REM PT RTRN 9FT ADLT (ELECTROSURGICAL) ×1 IMPLANT
GLOVE BIO SURGEON STRL SZ 6.5 (GLOVE) ×2 IMPLANT
GLOVE INDICATOR 7.0 STRL GRN (GLOVE) ×2 IMPLANT
GOWN STRL REUS W/ TWL LRG LVL3 (GOWN DISPOSABLE) ×3 IMPLANT
GOWN STRL REUS W/TWL LRG LVL3 (GOWN DISPOSABLE) ×3
GRASPER SUT TROCAR 14GX15 (MISCELLANEOUS) ×1 IMPLANT
IRRIGATION STRYKERFLOW (MISCELLANEOUS) ×1 IMPLANT
IRRIGATOR STRYKERFLOW (MISCELLANEOUS) ×2
IV CATH ANGIO 12GX3 LT BLUE (NEEDLE) IMPLANT
IV NS 1000ML (IV SOLUTION) ×1
IV NS 1000ML BAXH (IV SOLUTION) ×1 IMPLANT
KIT TURNOVER KIT A (KITS) ×2 IMPLANT
KITTNER LAPARASCOPIC 5X40 (MISCELLANEOUS) ×1 IMPLANT
LABEL OR SOLS (LABEL) ×2 IMPLANT
NEEDLE HYPO 22GX1.5 SAFETY (NEEDLE) ×2 IMPLANT
NS IRRIG 500ML POUR BTL (IV SOLUTION) ×2 IMPLANT
PACK LAP CHOLECYSTECTOMY (MISCELLANEOUS) ×2 IMPLANT
PENCIL ELECTRO HAND CTR (MISCELLANEOUS) ×2 IMPLANT
POUCH SPECIMEN RETRIEVAL 10MM (ENDOMECHANICALS) ×2 IMPLANT
SCISSORS METZENBAUM CVD 33 (INSTRUMENTS) ×2 IMPLANT
SET TUBE SMOKE EVAC HIGH FLOW (TUBING) ×2 IMPLANT
SLEEVE ADV FIXATION 5X100MM (TROCAR) ×4 IMPLANT
SOLUTION ELECTROLUBE (MISCELLANEOUS) ×2 IMPLANT
STRIP CLOSURE SKIN 1/2X4 (GAUZE/BANDAGES/DRESSINGS) ×2 IMPLANT
SUT MNCRL 4-0 (SUTURE) ×1
SUT MNCRL 4-0 27XMFL (SUTURE) ×1
SUT VIC AB 3-0 SH 27 (SUTURE) ×1
SUT VIC AB 3-0 SH 27X BRD (SUTURE) ×1 IMPLANT
SUT VICRYL 0 AB UR-6 (SUTURE) ×4 IMPLANT
SUTURE MNCRL 4-0 27XMF (SUTURE) ×1 IMPLANT
SYS KII FIOS ACCESS ABD 5X100 (TROCAR) ×2
SYSTEM KII FIOS ACES ABD 5X100 (TROCAR) ×1 IMPLANT
TROCAR ADV FIXATION 12X100MM (TROCAR) ×2 IMPLANT
WATER STERILE IRR 1000ML POUR (IV SOLUTION) ×2 IMPLANT

## 2019-08-01 NOTE — Anesthesia Preprocedure Evaluation (Signed)
Anesthesia Evaluation  Patient identified by MRN, date of birth, ID band Patient awake    Reviewed: Allergy & Precautions, NPO status , Patient's Chart, lab work & pertinent test results  History of Anesthesia Complications Negative for: history of anesthetic complications  Airway Mallampati: III       Dental  (+) Dental Advidsory Given, Caps, Missing, Teeth Intact   Pulmonary neg sleep apnea, neg COPD, Not current smoker,    Pulmonary exam normal        Cardiovascular (-) hypertension(-) angina(-) Past MI, (-) Cardiac Stents and (-) CHF Normal cardiovascular exam+ dysrhythmias (pt with afib s/p MVR converted medically, SR since) Atrial Fibrillation + Valvular Problems/Murmurs (MV Repair)      Neuro/Psych neg Seizures    GI/Hepatic Neg liver ROS, GERD  Medicated and Controlled,  Endo/Other  neg diabetes  Renal/GU negative Renal ROS     Musculoskeletal   Abdominal   Peds  Hematology   Anesthesia Other Findings Past Medical History: No date: Allergic rhinitis, cause unspecified No date: Allergy No date: CHF (congestive heart failure) (HCC) No date: Diverticulosis of colon (without mention of hemorrhage) No date: Esophageal reflux No date: Measles without mention of complication No date: Mumps without mention of complication No date: 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 No date: Paroxysmal SVT (supraventricular tachycardia) (HCC)   Reproductive/Obstetrics                             Anesthesia Physical  Anesthesia Plan  ASA: II  Anesthesia Plan: General   Post-op Pain Management:    Induction: Intravenous  PONV Risk Score and Plan: 2 and Dexamethasone, Ondansetron, Midazolam and Promethazine  Airway Management Planned: Oral ETT  Additional Equipment:   Intra-op Plan:   Post-operative Plan: Extubation in OR  Informed  Consent: I have reviewed the patients History and Physical, chart, labs and discussed the procedure including the risks, benefits and alternatives for the proposed anesthesia with the patient or authorized representative who has indicated his/her understanding and acceptance.       Plan Discussed with:   Anesthesia Plan Comments:         Anesthesia Quick Evaluation

## 2019-08-01 NOTE — Anesthesia Procedure Notes (Signed)
Procedure Name: Intubation Date/Time: 08/01/2019 7:40 AM Performed by: Gentry Fitz, CRNA Pre-anesthesia Checklist: Patient identified, Emergency Drugs available, Suction available and Patient being monitored Patient Re-evaluated:Patient Re-evaluated prior to induction Oxygen Delivery Method: Circle system utilized Preoxygenation: Pre-oxygenation with 100% oxygen Induction Type: IV induction Ventilation: Mask ventilation without difficulty Laryngoscope Size: Mac Grade View: Grade I Tube type: Oral Tube size: 7.5 mm Number of attempts: 1 Airway Equipment and Method: Stylet Placement Confirmation: ETT inserted through vocal cords under direct vision and positive ETCO2 Secured at: 23 cm Tube secured with: Tape Dental Injury: Teeth and Oropharynx as per pre-operative assessment

## 2019-08-01 NOTE — OR Nursing (Signed)
Per Dr. Lady Gary secure chat, patient may resume aspirin and mobic tomorrow, added to d/c instructions, medication section.

## 2019-08-01 NOTE — Transfer of Care (Signed)
Immediate Anesthesia Transfer of Care Note  Patient: Austin Stewart  Procedure(s) Performed: LAPAROSCOPIC CHOLECYSTECTOMY (N/A Abdomen)  Patient Location: PACU  Anesthesia Type:General  Level of Consciousness: awake, alert  and drowsy  Airway & Oxygen Therapy: Patient Spontanous Breathing and Patient connected to face mask oxygen  Post-op Assessment: Report given to RN and Post -op Vital signs reviewed and stable  Post vital signs: Reviewed and stable  Last Vitals:  Vitals Value Taken Time  BP 129/64 08/01/19 0906  Temp    Pulse 71 08/01/19 0907  Resp 18 08/01/19 0907  SpO2 100 % 08/01/19 0907  Vitals shown include unvalidated device data.  Last Pain:  Vitals:   08/01/19 0634  TempSrc: Temporal  PainSc: 0-No pain         Complications: No apparent anesthesia complications

## 2019-08-01 NOTE — Op Note (Signed)
Laparoscopic Cholecystectomy  Pre-operative Diagnosis: Symptomatic cholelithiasis  Post-operative Diagnosis: Same  Procedure: Laparoscopic cholecystectomy  Surgeon: [name], MD  Anesthesia: GETA  Assistant: None   Findings: Normal biliary anatomy, no concern for acute cholecystitis.   Estimated Blood Loss: Less than 5 cc         Drains: None         Specimens: Gallbladder           Complications: none   Procedure Details  The patient was seen again in the preoperative holding area. The benefits, complications, treatment options, and expected outcomes were discussed with the patient. The risks of bleeding, infection, recurrence of symptoms, failure to resolve symptoms, bile duct damage, bile duct leak, retained common bile duct stone, bowel injury, any of which could require further surgery and/or ERCP, stent, or papillotomy were reviewed with the patient. The likelihood of improving the patient's symptoms with return to their baseline status is good.  The patient and/or family concurred with the proposed plan, giving informed consent.  The patient was taken to operating room, identified as Austin Stewart and the procedure verified as Laparoscopic Cholecystectomy. A time out was performed and the above information confirmed.  Prior to the induction of general anesthesia, antibiotic prophylaxis was administered. VTE prophylaxis was in place. General endotracheal anesthesia was then administered and tolerated well. After the induction, the abdomen was prepped with Chloraprep and draped in the sterile fashion. The patient was positioned in the supine position.  Optiview technique was used to enter the abdomen in the right upper quadrant, just below the costal margin in the midclavicular line.  Pneumoperitoneum was then created with CO2 and tolerated well without any adverse changes in the patient's vital signs.  A 10 mm supraumbilical port was placed, followed by 2 additional  right upper quadrant ports, all under direct vision. All skin incisions  were infiltrated with a local anesthetic agent before making the incision and placing the trocars.   The patient was positioned  in reverse Trendelenburg, tilted slightly to the patient's left.  The gallbladder was identified, the fundus grasped and retracted cephalad. Adhesions were lysed bluntly. The infundibulum was grasped and retracted laterally, exposing the peritoneum overlying the triangle of Calot. This was then divided and exposed in a blunt fashion. An extended critical view of the cystic duct and cystic artery was obtained.  The cystic duct was clearly identified and bluntly dissected free. Both the cystic artery and duct were double clipped and divided.  The gallbladder was taken from the gallbladder fossa in a retrograde fashion with the electrocautery. The gallbladder was removed and placed in an Endo pouch bag. The liver bed was irrigated and inspected. Hemostasis was achieved with the electrocautery. Copious saline irrigation was utilized and was repeatedly aspirated until clear.  The gallbladder and Endo pouch sac were then removed through a port site.    Inspection of the right upper quadrant was performed. No bleeding, bile duct injury or leak, or bowel injury was noted. Pneumoperitoneum was released.  The periumbilical port site was closed with interrumpted 0 Vicryl sutures. 4-0 subcuticular Monocryl was used to close the skin. Dermabond was applied.  The patient was then extubated and brought to the recovery room in stable condition. Sponge, lap, and needle counts were correct at closure and at the conclusion of the case.               [name], MD, FACS

## 2019-08-01 NOTE — Interval H&P Note (Signed)
History and Physical Interval Note:  08/01/2019 7:13 AM  Austin Stewart  has presented today for surgery, with the diagnosis of Symptomatic cholelithiasis.  The various methods of treatment have been discussed with the patient and family. After consideration of risks, benefits and other options for treatment, the patient has consented to  Procedure(s): LAPAROSCOPIC CHOLECYSTECTOMY (N/A) as a surgical intervention.  The patient's history has been reviewed, patient examined, no change in status, stable for surgery.  I have reviewed the patient's chart and labs.  Questions were answered to the patient's satisfaction.     [name]

## 2019-08-02 NOTE — Anesthesia Postprocedure Evaluation (Signed)
Anesthesia Post Note  Patient: Austin Stewart  Procedure(s) Performed: LAPAROSCOPIC CHOLECYSTECTOMY (N/A Abdomen)  Patient location during evaluation: PACU Anesthesia Type: General Level of consciousness: awake and alert Pain management: pain level controlled Vital Signs Assessment: post-procedure vital signs reviewed and stable Respiratory status: spontaneous breathing, nonlabored ventilation, respiratory function stable and patient connected to nasal cannula oxygen Cardiovascular status: blood pressure returned to baseline and stable Postop Assessment: no apparent nausea or vomiting Anesthetic complications: no     Last Vitals:  Vitals:   08/01/19 0946 08/01/19 1011  BP: 138/82 (!) 137/97  Pulse: 63 63  Resp: 16 18  Temp: (!) 35.9 C   SpO2: 100% 100%    Last Pain:  Vitals:   08/01/19 1011  TempSrc:   PainSc: 0-No pain                 Lenard Simmer

## 2019-08-04 LAB — SURGICAL PATHOLOGY

## 2019-08-05 ENCOUNTER — Other Ambulatory Visit: Payer: Self-pay

## 2019-08-05 ENCOUNTER — Encounter: Payer: Self-pay | Admitting: Family Medicine

## 2019-08-05 ENCOUNTER — Other Ambulatory Visit: Payer: Self-pay | Admitting: Family Medicine

## 2019-08-05 ENCOUNTER — Ambulatory Visit (INDEPENDENT_AMBULATORY_CARE_PROVIDER_SITE_OTHER): Payer: Medicare HMO | Admitting: Family Medicine

## 2019-08-05 VITALS — BP 120/72 | HR 70 | Temp 97.5°F | Resp 16 | Ht 70.0 in | Wt 182.0 lb

## 2019-08-05 DIAGNOSIS — M8949 Other hypertrophic osteoarthropathy, multiple sites: Secondary | ICD-10-CM

## 2019-08-05 DIAGNOSIS — R7303 Prediabetes: Secondary | ICD-10-CM | POA: Diagnosis not present

## 2019-08-05 DIAGNOSIS — K219 Gastro-esophageal reflux disease without esophagitis: Secondary | ICD-10-CM | POA: Diagnosis not present

## 2019-08-05 DIAGNOSIS — Z23 Encounter for immunization: Secondary | ICD-10-CM

## 2019-08-05 DIAGNOSIS — I471 Supraventricular tachycardia: Secondary | ICD-10-CM

## 2019-08-05 DIAGNOSIS — N401 Enlarged prostate with lower urinary tract symptoms: Secondary | ICD-10-CM

## 2019-08-05 DIAGNOSIS — Z9889 Other specified postprocedural states: Secondary | ICD-10-CM

## 2019-08-05 DIAGNOSIS — Z0000 Encounter for general adult medical examination without abnormal findings: Secondary | ICD-10-CM | POA: Diagnosis not present

## 2019-08-05 DIAGNOSIS — E785 Hyperlipidemia, unspecified: Secondary | ICD-10-CM

## 2019-08-05 DIAGNOSIS — R3914 Feeling of incomplete bladder emptying: Secondary | ICD-10-CM

## 2019-08-05 DIAGNOSIS — E663 Overweight: Secondary | ICD-10-CM

## 2019-08-05 DIAGNOSIS — M159 Polyosteoarthritis, unspecified: Secondary | ICD-10-CM

## 2019-08-05 NOTE — Assessment & Plan Note (Signed)
Controlled on Nexium 20mg 

## 2019-08-05 NOTE — Assessment & Plan Note (Signed)
Followed by Cardiology On anti arrhythmic, BB

## 2019-08-05 NOTE — Assessment & Plan Note (Signed)
Followed by Cardiology Controlled on low dose intermittent statin, Rosuvastatin 5mg  every other day Primarily for ASCVD primary risk reduction

## 2019-08-05 NOTE — Assessment & Plan Note (Signed)
Well-controlled Pre-DM with A1c 5.2 Seems likely resolved issue, can adjust dx in future  Plan:  1. Not on any therapy currently  2. Encourage improved lifestyle - low carb, low sugar diet, reduce portion size, continue improving regular exercise

## 2019-08-05 NOTE — Progress Notes (Signed)
Subjective:    Patient ID: Austin Stewart, male    DOB: March 11, 1956, 64 y.o.   MRN: [id number]  Austin Stewart is a 64 y.o. male presenting on 08/05/2019 for Annual Exam   HPI   Here for Annual Physical and Lab Review.  Osteoarthritis, Hips / bilateral thumb joints CMC, other joints See prior note for background. Previously seen [facility] >4+ year ago. Podiatry plantar fasciitis Rarely taking Meloxicam 15mg  PRN 4-5 times a month and doing well. For L hip arthritis pain, he was referred to PT in 2020. He had some weakened muscle group with Left hip, and has done well on that therapy now. He maintains these exercises and stretches. Off baclofen  Cardiology / history of SVT w/ palpitations / H/o Mitral Valve Repair History of HLD Followed by Cardiology. Previous history of Diastolic CHF in past, but now on last ECHO 09/2018 it has normalized and no relaxation dysfunction or abnormality Continues on Flecanide and low dose BB metoprolol Tolerating Rosuva 5mg  every other day, improved lower LDL He will return to Cardiology as planned.  BPH, with LUTS Previously w Dr 10/2018 Uro, 12/2017 had prostate laser surgery He is doing well still. Not on flomax any more.  Allergic Rhinitis - using Xyzal 5mg  daily, instead of Singulair  GERD Controlled on Nexium 20mg   History of Cholelithiasis / Chronic Cholecystitis / s/p Lap Choleycsytectomy 08/01/19 Recent course, see prior note, work up since 06/2019, confirmed on imaging, established with Dr Gen Surgery.  He is doing well post-op. Pain mostly resolved. Off Hydrocodone since Saturday Incision sites are doing well. He says post-op is 08/19/19 approx Taking Meloxicam PRN daily for now still helping. Reduced fat diet, not eating out as much  Retired 06/2019  Health Maintenance:  Last PSA done 0.4 (06/2019) - negative, previously seen by Urology Dr Saturday  Colon CA Screening: Last Colonoscopy (done 10 year ago) no  polyps good for 10 years. Now UTD Cologuard 08/04/2018 negative. Next due in 2 years either colonoscopy or cologuard.  Due for initial pneumonia vaccine at age 45 - will receive Prevnar-13 today   Depression screen [facility] 2/9 08/05/2019 02/18/2019 01/28/2019  Decreased Interest 0 1 0  Down, Depressed, Hopeless 0 0 0  PHQ - 2 Score 0 1 0  Altered sleeping - 0 -  Tired, decreased energy - 0 -  Change in appetite - 0 -  Feeling bad or failure about yourself  - 0 -  Trouble concentrating - 0 -  Moving slowly or fidgety/restless - 0 -  Suicidal thoughts - 0 -  PHQ-9 Score - 1 -  Difficult doing work/chores - Not difficult at all -  Some encounter information is confidential and restricted. Go to Review Flowsheets activity to see all data.    Past Medical History:  Diagnosis Date  . Allergic rhinitis, cause unspecified   . Allergy   . Diverticulosis of colon (without mention of hemorrhage)   . Esophageal reflux   . Measles without mention of complication   . Mumps without mention of complication   . 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 Paroxysmal SVT (supraventricular tachycardia) (HCC)    Past Surgical History:  Procedure Laterality Date  . CARDIAC CATHETERIZATION  2006   [facility]   . CHOLECYSTECTOMY N/A 08/01/2019   Procedure: LAPAROSCOPIC CHOLECYSTECTOMY;  Surgeon: 02/20/2019, MD;  Location: [facility] ORS;  Service: General;  Laterality: N/A;  .  FOOT SURGERY  03/2017   right great toe Dr.Hyatt   . GREEN LIGHT LASER TURP (TRANSURETHRAL RESECTION OF PROSTATE N/A 01/15/2018   Procedure: GREEN LIGHT LASER TURP (TRANSURETHRAL RESECTION OF PROSTATE;  Surgeon: Orson Ape, MD;  Location: [facility] ORS;  Service: Urology;  Laterality: N/A;  . HERNIA REPAIR    . MITRAL VALVE REPAIR  2006  . rotator cuff repair    . SHOULDER ARTHROSCOPY Left 08/24/2016   Procedure: ARTHROSCOPY SHOULDER with debridement;  Surgeon: Juanell Fairly, MD;  Location: [facility]  ORS;  Service: Orthopedics;  Laterality: Left;  . TONSILLECTOMY     Social History   Socioeconomic History  . Marital status: Married    Spouse name: Not on file  . Number of children: Not on file  . Years of education: Not on file  . Highest education level: Not on file  Occupational History  . Not on file  Tobacco Use  . Smoking status: Never Smoker  . Smokeless tobacco: Never Used  Substance and Sexual Activity  . Alcohol use: No  . Drug use: No  . Sexual activity: Not on file  Other Topics Concern  . Not on file  Social History Narrative  . Not on file   Social Determinants of Health   Financial Resource Strain:   . Difficulty of Paying Living Expenses:   Food Insecurity:   . Worried About Programme researcher, broadcasting/film/video in the Last Year:   . Barista in the Last Year:   Transportation Needs:   . Freight forwarder (Medical):   Marland Kitchen Lack of Transportation (Non-Medical):   Physical Activity:   . Days of Exercise per Week:   . Minutes of Exercise per Session:   Stress:   . Feeling of Stress :   Social Connections:   . Frequency of Communication with Friends and Family:   . Frequency of Social Gatherings with Friends and Family:   . Attends Religious Services:   . Active Member of Clubs or Organizations:   . Attends Banker Meetings:   Marland Kitchen Marital Status:   Intimate Partner Violence:   . Fear of Current or Ex-Partner:   . Emotionally Abused:   Marland Kitchen Physically Abused:   . Sexually Abused:    Family History  Problem Relation Age of Onset  . Heart disease Mother        s/p stent placement  . Hypertension Mother   . Gallbladder disease Mother   . Emphysema Father   . Dementia Father   . Osteoporosis Father   . Cancer Maternal Uncle        kidney cancer   Current Outpatient Medications on File Prior to Visit  Medication Sig  . aspirin EC 81 MG tablet Take 1 tablet (81 mg total) by mouth daily.  Marland Kitchen esomeprazole (NEXIUM) 20 MG capsule TAKE 1 CAPSULE  (20 MG TOTAL) BY MOUTH DAILY BEFORE BREAKFAST  **REPLACES OMEPRAZOLE** (Patient taking differently: Take 20 mg by mouth daily before breakfast. )  . flecainide (TAMBOCOR) 50 MG tablet Take 1 tablet (50 mg total) by mouth 2 (two) times daily.  . fluticasone (FLONASE) 50 MCG/ACT nasal spray PLACE 2 SPRAYS INTO BOTH NOSTRILS DAILY (Patient taking differently: Place 2 sprays into both nostrils daily as needed for allergies. )  . levocetirizine (XYZAL) 5 MG tablet TAKE 1 TABLET EVERY EVENING (Patient taking differently: Take 5 mg by mouth every evening. )  . meloxicam (MOBIC) 15 MG tablet TAKE 1 TABLET (15  MG TOTAL) BY MOUTH DAILY AS NEEDED FOR PAIN. UP TO 1-2 WEEKS MAX THEN REDUCE  . metoprolol succinate (TOPROL-XL) 25 MG 24 hr tablet Take 0.5 tablets (12.5 mg total) by mouth daily.  . Multiple Vitamins-Minerals (MULTIVITAMIN WITH MINERALS) tablet Take 1 tablet by mouth daily.  Marland Kitchen OVER THE COUNTER MEDICATION Place 20 drops under the tongue 2 (two) times daily. CBD Oil  . rosuvastatin (CRESTOR) 5 MG tablet Take 1 tablet (5 mg total) by mouth every other day.  . Turmeric 500 MG CAPS Take 500 mg by mouth 2 (two) times daily.    No current facility-administered medications on file prior to visit.    Review of Systems  Constitutional: Negative for activity change, appetite change, chills, diaphoresis, fatigue and fever.  HENT: Negative for congestion and hearing loss.   Eyes: Negative for visual disturbance.  Respiratory: Negative for apnea, cough, chest tightness, shortness of breath and wheezing.   Cardiovascular: Negative for chest pain, palpitations and leg swelling.  Gastrointestinal: Negative for abdominal pain, anal bleeding, blood in stool, constipation, diarrhea, nausea and vomiting.  Endocrine: Negative for cold intolerance.  Genitourinary: Negative for difficulty urinating, dysuria, frequency and hematuria.  Musculoskeletal: Negative for arthralgias, back pain and neck pain.  Skin: Negative  for rash.  Allergic/Immunologic: Negative for environmental allergies.  Neurological: Negative for dizziness, weakness, light-headedness, numbness and headaches.  Hematological: Negative for adenopathy.  Psychiatric/Behavioral: Negative for behavioral problems, dysphoric mood and sleep disturbance. The patient is not nervous/anxious.    Per HPI unless specifically indicated above      Objective:    BP 120/72   Pulse 70   Temp (!) 97.5 F (36.4 C) (Temporal)   Resp 16   Ht  (1.778 m)   Wt 182 lb (82.6 kg)   BMI 26.11 kg/m   Wt Readings from Last 3 Encounters:  08/05/19 182 lb (82.6 kg)  08/01/19 190 lb 0.6 oz (86.2 kg)  07/29/19 190 lb (86.2 kg)    Physical Exam Vitals and nursing note reviewed.  Constitutional:      General: He is not in acute distress.    Appearance: He is well-developed. He is not diaphoretic.     Comments: Well-appearing, comfortable, cooperative  HENT:     Head: Normocephalic and atraumatic.  Eyes:     General:        Right eye: No discharge.        Left eye: No discharge.     Conjunctiva/sclera: Conjunctivae normal.     Pupils: Pupils are equal, round, and reactive to light.  Neck:     Thyroid: No thyromegaly.  Cardiovascular:     Rate and Rhythm: Normal rate and regular rhythm.     Heart sounds: Normal heart sounds. No murmur.  Pulmonary:     Effort: Pulmonary effort is normal. No respiratory distress.     Breath sounds: Normal breath sounds. No wheezing or rales.  Abdominal:     General: Bowel sounds are normal. There is no distension.     Palpations: Abdomen is soft. There is no mass.     Tenderness: There is no abdominal tenderness.     Comments: 4 incision sites from recent laparoscopic cholecystectomy. Have original steri-strips intact. No oozing or bleeding. Some mild ecchymosis surrounding local, no erythema, no nodular density or abnormality  Musculoskeletal:        General: No tenderness. Normal range of motion.     Cervical  back: Normal range of motion and neck  supple.     Comments: Upper / Lower Extremities: - Normal muscle tone, strength bilateral upper extremities 5/5, lower extremities 5/5  Lymphadenopathy:     Cervical: No cervical adenopathy.  Skin:    General: Skin is warm and dry.     Findings: No erythema or rash.  Neurological:     Mental Status: He is alert and oriented to person, place, and time.     Comments: Distal sensation intact to light touch all extremities  Psychiatric:        Behavior: Behavior normal.     Comments: Well groomed, good eye contact, normal speech and thoughts      ECHOCARDIOGRAM REPORT       Patient Name:  ISSAC MOURE Date of Exam: 10/17/2018  Medical Rec #: [id number]    Height:    70.0 in  Accession #:  [id number]   Weight:    193.1 lb  Date of Birth: 01/19/1956    BSA:     2.06 m  Patient Age:  62 years    BP:      128/72 mmHg  Patient Gender: M        HR:      64 bpm.  Exam Location: [facility]     Procedure: 2D Echo, Cardiac Doppler and Color Doppler   Indications:  R06.02 SOB; I34.8 Other nonrheumatic mitral valve  disorders    History:    Patient has prior history of Echocardiogram examinations,  most         recent 07/10/2011. CHF Mitral Valve Disease Signs/Symptoms:         Shortness of Breath, Chest Pain and Dizziness Risk  Factors:         Non-Smoker, Hypertension and Dyslipidemia. Mitral Valve:         Annuloplasty is present in the mitral position. Procedure  Date:         2006.    Sonographer:  Quentin Ore RDMS, RVT, RDCS  Referring Phys: [id number] Raymon Mutton DUNN   IMPRESSIONS    1. The left ventricle has normal systolic function with an ejection  fraction of 60-65%. The cavity size was normal. Left ventricular diastolic  parameters were normal.  2. The right ventricle has normal systolic function. The cavity was  normal. There  is no increase in right ventricular wall thickness. Right  ventricular systolic pressure is mildly elevated with an estimated  pressure of 34.4 mmHg.  3. An Annuloplasty valve is present in the mitral position. Procedure  Date: 2006.  4. The mitral valve is grossly normal. Mitral valve regurgitation is  moderate  5. Mildly dilated Aortic root, 3.8 cm   FINDINGS  Left Ventricle: The left ventricle has normal systolic function, with an  ejection fraction of 60-65%. The cavity size was normal. There is no  increase in left ventricular wall thickness. Left ventricular diastolic  parameters were normal.   Right Ventricle: The right ventricle has normal systolic function. The  cavity was normal. There is no increase in right ventricular wall  thickness. Right ventricular systolic pressure is mildly elevated with an  estimated pressure of 34.4 mmHg.   Left Atrium: Left atrial size was normal in size.   Right Atrium: Right atrial size was normal in size. Right atrial pressure  is estimated at 10 mmHg.   Interatrial Septum: No atrial level shunt detected by color flow Doppler.   Pericardium: There is no evidence of pericardial effusion.   Mitral Valve: The mitral valve  is grossly normal. Mitral valve  regurgitation is moderate by color flow Doppler. A Annuloplasty valve is  present in the mitral position. Procedure Date: 2006.   Tricuspid Valve: The tricuspid valve is normal in structure. Tricuspid  valve regurgitation is mild by color flow Doppler.   Aortic Valve: The aortic valve is grossly normal Aortic valve  regurgitation is trivial by color flow Doppler.   Pulmonic Valve: The pulmonic valve was grossly normal. Pulmonic valve  regurgitation is mild by color flow Doppler.   Venous: The inferior vena cava is normal in size with greater than 50%  respiratory variability.     +--------------+--------++  LEFT VENTRICLE       +----------------+----------++   +--------------+--------++   Diastology          PLAX 2D           +----------------+----------++  +--------------+--------++   LV e' lateral: 11.00 cm/s  LVIDd:    4.95 cm    +----------------+----------++  +--------------+--------++   LV E/e' lateral:7.9      LVIDs:    3.85 cm    +----------------+----------++  +--------------+--------++   LV e' medial:  8.92 cm/s   LV PW:    0.95 cm    +----------------+----------++  +--------------+--------++   LV E/e' medial: 9.8      LV IVS:    0.80 cm    +----------------+----------++  +--------------+--------++  LVOT diam:  2.30 cm   +--------------+--------++  LV SV:    52 ml    +--------------+--------++  LV SV Index: 24.61    +--------------+--------++  LVOT Area:  4.15 cm  +--------------+--------++               +--------------+--------++    +------------------+---------++  LV Volumes (MOD)        +------------------+---------++  LV area d, A4C:  42.50 cm  +------------------+---------++  LV area s, A4C:  28.80 cm  +------------------+---------++  LV major d, A4C: 9.16 cm   +------------------+---------++  LV major s, A4C: 8.57 cm   +------------------+---------++  LV vol d, MOD A4C:161.0 ml   +------------------+---------++  LV vol s, MOD A4C:82.3 ml   +------------------+---------++  LV SV MOD A4C:  161.0 ml   +------------------+---------++   +---------------+----------++  RIGHT VENTRICLE       +---------------+----------++  RV S prime:  11.60 cm/s  +---------------+----------++  TAPSE (M-mode):2.3 cm    +---------------+----------++   +---------------+-------++-----------++  LEFT ATRIUM      Index     +---------------+-------++-----------++  LA diam:    3.70 cm1.80 cm/m    +---------------+-------++-----------++  LA Vol (A2C): 49.2 ml23.92 ml/m  +---------------+-------++-----------++  LA Vol (A4C): 40.6 ml19.74 ml/m  +---------------+-------++-----------++  LA Biplane Vol:45.6 ml22.17 ml/m  +---------------+-------++-----------++  +------------+---------++-----------++  RIGHT ATRIUM     Index     +------------+---------++-----------++  RA Area:  13.20 cm        +------------+---------++-----------++  RA Volume: 30.10 ml 14.64 ml/m  +------------+---------++-----------++  +------------------+-----------++ +--------------+--------++  AORTIC VALVE          PULMONIC VALVE      +------------------+-----------++ +--------------+--------++  AV Area (Vmax):  3.45 cm   PV Vmax:   0.53 m/s  +------------------+-----------++ +--------------+--------++  AV Area (Vmean): 3.35 cm   PV Peak grad: 1.1 mmHg  +------------------+-----------++ +--------------+--------++  AV Area (VTI):  3.72 cm    +------------------+-----------++  AV Vmax:     105.00 cm/s  +------------------+-----------++  AV Vmean:     73.300 cm/s  +------------------+-----------++  AV VTI:  0.201 m    +------------------+-----------++  AV Peak Grad:   4.4 mmHg    +------------------+-----------++  AV Mean Grad:   2.0 mmHg    +------------------+-----------++  LVOT Vmax:    87.20 cm/s   +------------------+-----------++  LVOT Vmean:    59.100 cm/s  +------------------+-----------++  LVOT VTI:     0.180 m    +------------------+-----------++  LVOT/AV VTI ratio:0.90      +------------------+-----------++  AR PHT:      582 msec    +------------------+-----------++    +-------------+-------++  AORTA          +-------------+-------++  Ao Root diam:3.80 cm   +-------------+-------++  Ao Asc diam: 3.10 cm  +-------------+-------++  Ao Arch diam:2.3 cm   +-------------+-------++   +--------------+----------++ +---------------+-----------++  MITRAL VALVE       TRICUSPID VALVE        +--------------+----------++ +---------------+-----------++  MV Area (PHT):2.83 cm  TR Peak grad: 24.4 mmHg   +--------------+----------++ +---------------+-----------++  MV PHT:    77.72 msec TR Vmax:    247.00 cm/s  +--------------+----------++ +---------------+-----------++  MV Decel Time:268 msec   +--------------+----------++ +--------------+-------+  +--------------+----------++ SHUNTS          MV E velocity:87.30 cm/s +--------------+-------+  +--------------+----------++ Systemic VTI: 0.18 m   MV A velocity:51.90 cm/s +--------------+-------+  +--------------+----------++ Systemic Diam:2.30 cm  MV E/A ratio: 1.68    +--------------+-------+  +--------------+----------++     Julien Nordmann MD  Electronically signed by Julien Nordmann MD  Signature Date/Time: 10/18/2018/2:22:19 PM      Final    Results for orders placed or performed during the hospital encounter of 08/01/19  Surgical pathology  Result Value Ref Range   SURGICAL PATHOLOGY      SURGICAL PATHOLOGY CASE: [id number] PATIENT: Paula Libra Surgical Pathology Report     Specimen Submitted: A. Gallbladder  Clinical History: Symptomatic cholelithiasis.      DIAGNOSIS: A. GALLBLADDER, CHOLECYSTECTOMY: - CHRONIC CHOLECYSTITIS WITH CHOLELITHIASIS. - NEGATIVE FOR MALIGNANCY.   GROSS DESCRIPTION: A. Labeled: Gallbladder Received: Formalin Size of specimen: 9.3 x 3.5 x 2.5 cm Specimen integrity: Intact External surface: Tan-green and smooth with minimal attached adipose tissue Wall thickness: 0.1 cm (uniform) Mucosa: Tan-green and velvety.  No polyps or abnormalities are  grossly identified. Cystic duct: 0.8 cm in length by 0.3 cm in diameter.  Patent.  No cystic duct lymph node is grossly identified. Bile present: Yes; green bile is present in the lumen. Stones present: Yes; multiple black, friable stones (4.0 x 1.3 x 0.3 cm in aggregate) are present in the lumen. Other findings: None grossly identified  Block summary: 1 - cysti c duct surgical resection margin (en face, inked blue) and gallbladder wall   Final Diagnosis performed by Georgeanna Harrison, MD.   Electronically signed 08/04/2019 10:08:50AM The electronic signature indicates that the named Attending Pathologist has evaluated the specimen Technical component performed at [facility], [address], [place], [place] [address] Lab: [phone] Dir: Jolene Schimke, MD, MMM  Professional component performed at [facility], [facility], [address] [place], [place], [place] [address] Lab: [phone] Dir: Georgiann Cocker. Rubinas, MD     Lipid Panel     Component Value Date/Time   CHOL 98 07/29/2019 0814   CHOL 94 (L) 09/24/2015 0727   TRIG 92 07/29/2019 0814   HDL 41 07/29/2019 0814   HDL 48 09/24/2015 0727   CHOLHDL 2.4 07/29/2019 0814   VLDL 19 06/16/2016 0001   LDLCALC 39 07/29/2019 0814   LABVLDL 16 09/24/2015 0727  Chemistry      Component Value Date/Time   NA 142 07/30/2019 1103   NA 143 09/24/2015 0727   K 4.2 07/30/2019 1103   CL 110 07/30/2019 1103   CO2 26 07/30/2019 1103   BUN 23 07/30/2019 1103   BUN 16 09/24/2015 0727   CREATININE 1.11 07/30/2019 1103   CREATININE 1.07 07/29/2019 0814      Component Value Date/Time   CALCIUM 9.1 07/30/2019 1103   ALKPHOS 63 06/16/2016 0001   AST 22 07/29/2019 0814   ALT 24 07/29/2019 0814   BILITOT 0.8 07/29/2019 0814   BILITOT 0.7 09/24/2015 0727     Recent Labs    07/29/19 0814  HGBA1C 5.2   CBC:    Component Value Date/Time   WBC 7.3 07/30/2019 1103   HGB 14.2 07/30/2019 1103   HGB 15.6 12/18/2014 0723   HCT  42.2 07/30/2019 1103   HCT 43.3 12/18/2014 0723   PLT 351 07/30/2019 1103   PLT 350 12/18/2014 0723   MCV 89.2 07/30/2019 1103   MCV 85 12/18/2014 0723   NEUTROABS 3,745 07/29/2019 0814   NEUTROABS 4.5 12/18/2014 0723   LYMPHSABS 1,186 07/29/2019 0814   LYMPHSABS 1.4 12/18/2014 0723   MONOABS 0.6 08/21/2016 1514   EOSABS 291 07/29/2019 0814   EOSABS 0.3 12/18/2014 0723   BASOSABS 68 07/29/2019 0814   BASOSABS 0.0 12/18/2014 0723        Assessment & Plan:   Problem List Items Addressed This Visit    Primary osteoarthritis involving multiple joints    Stable, without flare Controlled chronic Osteoarthritis multiple joints, bilateral thumbs CMC / hips other joint On conservative med management Rare use Meloxicam PRN PT therapy for L hip strengthening significantly helpful Future may consider other intervention vs ortho refer      Pre-diabetes    Well-controlled Pre-DM with A1c 5.2 Seems likely resolved issue, can adjust dx in future  Plan:  1. Not on any therapy currently  2. Encourage improved lifestyle - low carb, low sugar diet, reduce portion size, continue improving regular exercise      Paroxysmal SVT (supraventricular tachycardia) (HCC)    Followed by Cardiology On anti arrhythmic, BB      H/O mitral valve repair   GERD without esophagitis    Controlled on Nexium       Elevated lipids    Followed by Cardiology Controlled on low dose intermittent statin, Rosuvastatin  every other day Primarily for ASCVD primary risk reduction      BPH (benign prostatic hyperplasia)    Stable without problem Controlled now without medicine, s/p laser prostate procedure by Dr Evelene Croon in 2019 No longer followed by Dr Evelene Croon  No longer on flomax Last PSA negative 0.4 (06/2019)       Other Visit Diagnoses    Annual physical exam    -  Primary   Need for vaccination with 13-polyvalent pneumococcal conjugate vaccine       Relevant Orders   Pneumococcal conjugate  vaccine 13-valent IM (Completed)   Overweight (BMI 25.0-29.9)          Updated Health Maintenance information - Colon CA Screening: Neg Cologuard 07/2018. Next due colonoscopy vs cologuard 07/2021 - initial Prevnar13 pneumonia vaccine age 36 today, next due for Pneumovax23 in 1 year. Reviewed recent lab results with patient Encouraged improvement to lifestyle with diet and exercise - Goal of weight loss  #History of Diastolic CHF - RESOLVED Last ECHO reviewed 09/2018 per [facility]. No  evidence of diastolic CHF. No systolic CHF. Known prior mitral valve repair Next apt w Cardiology Dr Rockey Situ in 10/2019 Resolved diagnosis on problem list   No orders of the defined types were placed in this encounter.   Follow up plan: Return in about 1 year (around 08/04/2020) for Annual Physical.  Future labs ordered 07/2020  Nobie Putnam, [facility] 08/05/2019, 9:58 AM

## 2019-08-05 NOTE — Assessment & Plan Note (Addendum)
Stable without problem Controlled now without medicine, s/p laser prostate procedure by Dr Evelene Croon in 2019 No longer followed by Dr Evelene Croon  No longer on flomax Last PSA negative 0.4 (06/2019)

## 2019-08-05 NOTE — Assessment & Plan Note (Signed)
Stable, without flare Controlled chronic Osteoarthritis multiple joints, bilateral thumbs CMC / hips other joint On conservative med management Rare use Meloxicam PRN PT therapy for L hip strengthening significantly helpful Future may consider other intervention vs ortho refer

## 2019-08-05 NOTE — Patient Instructions (Addendum)
Thank you for coming to the office today.  Vaccine today - INOMVEH20 - first pneumonia vaccine for age 64. Then next due again in 1 year.  Keep up the good work with Meloxicam only intermittent and hip regimen that you are doing currently.  I removed Diastolic Congestive Heart Failure from your list due to last ECHOcardiogram results from Dr Mariah Milling. But feel free to discuss this further with him at next apt.  Next cologuard vs colonoscopy 2023.  PSA negative 0.4.  Recent Labs    07/29/19 0814  HGBA1C 5.2     DUE for FASTING BLOOD WORK (no food or drink after midnight before the lab appointment, only water or coffee without cream/sugar on the morning of)  SCHEDULE "Lab Only" visit in the morning at the clinic for lab draw in 1 YEAR  - Make sure Lab Only appointment is at about 1 week before your next appointment, so that results will be available  For Lab Results, once available within 2-3 days of blood draw, you can can log in to MyChart online to view your results and a brief explanation. Also, we can discuss results at next follow-up visit.   Please schedule a Follow-up Appointment to: Return in about 1 year (around 08/04/2020) for Annual Physical.  If you have any other questions or concerns, please feel free to call the office or send a message through MyChart. You may also schedule an earlier appointment if necessary.  Additionally, you may be receiving a survey about your experience at our office within a few days to 1 week by e-mail or mail. We value your feedback.  Saralyn Pilar, DO [facility], [place]

## 2019-08-19 ENCOUNTER — Ambulatory Visit (INDEPENDENT_AMBULATORY_CARE_PROVIDER_SITE_OTHER): Payer: Self-pay | Admitting: Physician Assistant

## 2019-08-19 ENCOUNTER — Encounter: Payer: Self-pay | Admitting: Physician Assistant

## 2019-08-19 ENCOUNTER — Other Ambulatory Visit: Payer: Self-pay

## 2019-08-19 VITALS — BP 127/85 | HR 61 | Temp 97.2°F | Ht 70.0 in | Wt 188.0 lb

## 2019-08-19 DIAGNOSIS — Z09 Encounter for follow-up examination after completed treatment for conditions other than malignant neoplasm: Secondary | ICD-10-CM

## 2019-08-19 DIAGNOSIS — K8020 Calculus of gallbladder without cholecystitis without obstruction: Secondary | ICD-10-CM

## 2019-08-19 NOTE — Patient Instructions (Addendum)
Laqueta Due, PA-C removed steri strip and cut a piece of suture at today's visit. Patient recommended to try Tylenol or Ibuprofen for pain management. Patient advised to refrain from submerging the area in a pool of water.  Patient is to refrain from any heavy lifting of 20 pounds or more for two more weeks.  Follow-up with our office as needed. Please call and ask to speak with a nurse if you develop questions or concerns.   GENERAL POST-OPERATIVE PATIENT INSTRUCTIONS   FOLLOW-UP:  Please make an appointment with your physician in.  Call your physician immediately if you have any fevers greater than 102.5, drainage from you wound that is not clear or looks infected, persistent bleeding, increasing abdominal pain, problems urinating, or persistent nausea/vomiting.    WOUND CARE INSTRUCTIONS:  Keep a dry clean dressing on the wound if there is drainage. The initial bandage may be removed after 24 hours.  Once the wound has quit draining you may leave it open to air.  If clothing rubs against the wound or causes irritation and the wound is not draining you may cover it with a dry dressing during the daytime.  Try to keep the wound dry and avoid ointments on the wound unless directed to do so.  If the wound becomes bright red and painful or starts to drain infected material that is not clear, please contact your physician immediately.  If the wound is mildly pink and has a thick firm ridge underneath it, this is normal, and is referred to as a healing ridge.  This will resolve over the next 4-6 weeks.  DIET:  You may eat any foods that you can tolerate.  It is a good idea to eat a high fiber diet and take in plenty of fluids to prevent constipation.  If you do become constipated you may want to take a mild laxative or take ducolax tablets on a daily basis until your bowel habits are regular.  Constipation can be very uncomfortable, along with straining, after recent surgery.  ACTIVITY:  You are  encouraged to cough and deep breath or use your incentive spirometer if you were given one, every 15-30 minutes when awake.  This will help prevent respiratory complications and low grade fevers post-operatively if you had a general anesthetic.  You may want to hug a pillow when coughing and sneezing to add additional support to the surgical area, if you had abdominal or chest surgery, which will decrease pain during these times.  You are encouraged to walk and engage in light activity for the next two weeks.  You should not lift more than 20 pounds during this time frame as it could put you at increased risk for complications.  Twenty pounds is roughly equivalent to a plastic bag of groceries.    MEDICATIONS:  Try to take narcotic medications and anti-inflammatory medications, such as tylenol, ibuprofen, naprosyn, etc., with food.  This will minimize stomach upset from the medication.  Should you develop nausea and vomiting from the pain medication, or develop a rash, please discontinue the medication and contact your physician.  You should not drive, make important decisions, or operate machinery when taking narcotic pain medication.  QUESTIONS:  Please feel free to call your physician or the hospital operator if you have any questions, and they will be glad to assist you.

## 2019-08-19 NOTE — Progress Notes (Signed)
[facility] POST-OP OFFICE VISIT  08/19/2019  HPI: JASMON GRAFFAM is a 64 y.o. male 18 days s/p laparoscopic cholecystectomy for symptomatic cholelithiasis with Dr Lady Gary  Overall doing well Mild right flank pain, 2/10, not requiring pain management No fever, chills, nausea, or emesis Tolerating PO No diarrhea No other complaints   Vital signs: There were no vitals taken for this visit.   Physical Exam: Constitutional: Well appearing male, NAD Abdomen: Soft, non-tender, non-distended, no rebound/guarding, no flank pain Skin: Laparoscopic incisions are well healed, no erythema or drainage  Assessment/Plan: This is a 64 y.o. male  18 days s/p laparoscopic cholecystectomy for symptomatic cholelithiasis   - Pain control prn  - reviewed wound care  - reviewed lifting restrictions  - Reviewed pathology: Chronic Cholecystitis w/ cholelithiasis, negative for malignancy  - rtc prn; discussed return precautions   -- Lynden Oxford, PA-C [facility] 08/19/2019, 10:08 AM [phone] M-F: 7am - 4pm

## 2019-09-11 ENCOUNTER — Other Ambulatory Visit: Payer: Self-pay | Admitting: Cardiovascular Disease

## 2019-09-18 DIAGNOSIS — S4991XA Unspecified injury of right shoulder and upper arm, initial encounter: Secondary | ICD-10-CM | POA: Diagnosis not present

## 2019-09-30 ENCOUNTER — Ambulatory Visit (INDEPENDENT_AMBULATORY_CARE_PROVIDER_SITE_OTHER): Payer: Medicare HMO

## 2019-09-30 ENCOUNTER — Other Ambulatory Visit: Payer: Self-pay | Admitting: Family Medicine

## 2019-09-30 VITALS — Ht 70.0 in | Wt 182.0 lb

## 2019-09-30 DIAGNOSIS — M19011 Primary osteoarthritis, right shoulder: Secondary | ICD-10-CM | POA: Diagnosis not present

## 2019-09-30 DIAGNOSIS — Z0000 Encounter for general adult medical examination without abnormal findings: Secondary | ICD-10-CM | POA: Diagnosis not present

## 2019-09-30 DIAGNOSIS — M25511 Pain in right shoulder: Secondary | ICD-10-CM | POA: Diagnosis not present

## 2019-09-30 DIAGNOSIS — X500XXA Overexertion from strenuous movement or load, initial encounter: Secondary | ICD-10-CM | POA: Diagnosis not present

## 2019-09-30 DIAGNOSIS — S46011A Strain of muscle(s) and tendon(s) of the rotator cuff of right shoulder, initial encounter: Secondary | ICD-10-CM | POA: Diagnosis not present

## 2019-09-30 DIAGNOSIS — J3089 Other allergic rhinitis: Secondary | ICD-10-CM

## 2019-09-30 MED ORDER — FLUTICASONE PROPIONATE 50 MCG/ACT NA SUSP: NASAL | 3 refills | Status: DC

## 2019-09-30 NOTE — Patient Instructions (Signed)
Austin Stewart , Thank you for taking time to come for your Medicare Wellness Visit. I appreciate your ongoing commitment to your health goals. Please review the following plan we discussed and let me know if I can assist you in the future.   Screening recommendations/referrals: Colonoscopy: Cologuard completed 2020 Due 2023 Recommended yearly ophthalmology/optometry visit for glaucoma screening and checkup Recommended yearly dental visit for hygiene and checkup  Vaccinations: Influenza vaccine: Due 12/2019 Pneumococcal vaccine: completed 07/2019 Due 07/2020 Tdap vaccine: Due. Check with insurance for coverage Shingles vaccine: Completed series   Covid-19: Completed series  Advanced directives: Copy on file  Conditions/risks identified: None  Next appointment: Follow up in 1 year for your annual wellness visit.10/05/2020 at 9am.  Preventive Care 65 Years and Older, Male Preventive care refers to lifestyle choices and visits with your health care provider that can promote health and wellness. What does preventive care include?  A yearly physical exam. This is also called an annual well check.  Dental exams once or twice a year.  Routine eye exams. Ask your health care provider how often you should have your eyes checked.  Personal lifestyle choices, including:  Daily care of your teeth and gums.  Regular physical activity.  Eating a healthy diet.  Avoiding tobacco and drug use.  Limiting alcohol use.  Practicing safe sex.  Taking low doses of aspirin every day.  Taking vitamin and mineral supplements as recommended by your health care provider. What happens during an annual well check? The services and screenings done by your health care provider during your annual well check will depend on your age, overall health, lifestyle risk factors, and family history of disease. Counseling  Your health care provider may ask you questions about your:  Alcohol use.  Tobacco  use.  Drug use.  Emotional well-being.  Home and relationship well-being.  Sexual activity.  Eating habits.  History of falls.  Memory and ability to understand (cognition).  Work and work Astronomer. Screening  You may have the following tests or measurements:  Height, weight, and BMI.  Blood pressure.  Lipid and cholesterol levels. These may be checked every 5 years, or more frequently if you are over 44 years old.  Skin check.  Lung cancer screening. You may have this screening every year starting at age 94 if you have a 30-pack-year history of smoking and currently smoke or have quit within the past 15 years.  Fecal occult blood test (FOBT) of the stool. You may have this test every year starting at age 55.  Flexible sigmoidoscopy or colonoscopy. You may have a sigmoidoscopy every 5 years or a colonoscopy every 10 years starting at age 7.  Prostate cancer screening. Recommendations will vary depending on your family history and other risks.  Hepatitis C blood test.  Hepatitis B blood test.  Sexually transmitted disease (STD) testing.  Diabetes screening. This is done by checking your blood sugar (glucose) after you have not eaten for a while (fasting). You may have this done every 1-3 years.  Abdominal aortic aneurysm (AAA) screening. You may need this if you are a current or former smoker.  Osteoporosis. You may be screened starting at age 66 if you are at high risk. Talk with your health care provider about your test results, treatment options, and if necessary, the need for more tests. Vaccines  Your health care provider may recommend certain vaccines, such as:  Influenza vaccine. This is recommended every year.  Tetanus, diphtheria, and acellular pertussis (  Tdap, Td) vaccine. You may need a Td booster every 10 years.  Zoster vaccine. You may need this after age 42.  Pneumococcal 13-valent conjugate (PCV13) vaccine. One dose is recommended after age  57.  Pneumococcal polysaccharide (PPSV23) vaccine. One dose is recommended after age 2. Talk to your health care provider about which screenings and vaccines you need and how often you need them. This information is not intended to replace advice given to you by your health care provider. Make sure you discuss any questions you have with your health care provider. Document Released: 05/14/2015 Document Revised: 01/05/2016 Document Reviewed: 02/16/2015 Elsevier Interactive Patient Education  2017 [employer] Prevention in the Home Falls can cause injuries. They can happen to people of all ages. There are many things you can do to make your home safe and to help prevent falls. What can I do on the outside of my home?  Regularly fix the edges of walkways and driveways and fix any cracks.  Remove anything that might make you trip as you walk through a door, such as a raised step or threshold.  Trim any bushes or trees on the path to your home.  Use bright outdoor lighting.  Clear any walking paths of anything that might make someone trip, such as rocks or tools.  Regularly check to see if handrails are loose or broken. Make sure that both sides of any steps have handrails.  Any raised decks and porches should have guardrails on the edges.  Have any leaves, snow, or ice cleared regularly.  Use sand or salt on walking paths during winter.  Clean up any spills in your garage right away. This includes oil or grease spills. What can I do in the bathroom?  Use night lights.  Install grab bars by the toilet and in the tub and shower. Do not use towel bars as grab bars.  Use non-skid mats or decals in the tub or shower.  If you need to sit down in the shower, use a plastic, non-slip stool.  Keep the floor dry. Clean up any water that spills on the floor as soon as it happens.  Remove soap buildup in the tub or shower regularly.  Attach bath mats securely with double-sided  non-slip rug tape.  Do not have throw rugs and other things on the floor that can make you trip. What can I do in the bedroom?  Use night lights.  Make sure that you have a light by your bed that is easy to reach.  Do not use any sheets or blankets that are too big for your bed. They should not hang down onto the floor.  Have a firm chair that has side arms. You can use this for support while you get dressed.  Do not have throw rugs and other things on the floor that can make you trip. What can I do in the kitchen?  Clean up any spills right away.  Avoid walking on wet floors.  Keep items that you use a lot in easy-to-reach places.  If you need to reach something above you, use a strong step stool that has a grab bar.  Keep electrical cords out of the way.  Do not use floor polish or wax that makes floors slippery. If you must use wax, use non-skid floor wax.  Do not have throw rugs and other things on the floor that can make you trip. What can I do with my stairs?  Do  not leave any items on the stairs.  Make sure that there are handrails on both sides of the stairs and use them. Fix handrails that are broken or loose. Make sure that handrails are as long as the stairways.  Check any carpeting to make sure that it is firmly attached to the stairs. Fix any carpet that is loose or worn.  Avoid having throw rugs at the top or bottom of the stairs. If you do have throw rugs, attach them to the floor with carpet tape.  Make sure that you have a light switch at the top of the stairs and the bottom of the stairs. If you do not have them, ask someone to add them for you. What else can I do to help prevent falls?  Wear shoes that:  Do not have high heels.  Have rubber bottoms.  Are comfortable and fit you well.  Are closed at the toe. Do not wear sandals.  If you use a stepladder:  Make sure that it is fully opened. Do not climb a closed stepladder.  Make sure that both  sides of the stepladder are locked into place.  Ask someone to hold it for you, if possible.  Clearly mark and make sure that you can see:  Any grab bars or handrails.  First and last steps.  Where the edge of each step is.  Use tools that help you move around (mobility aids) if they are needed. These include:  Canes.  Walkers.  Scooters.  Crutches.  Turn on the lights when you go into a dark area. Replace any light bulbs as soon as they burn out.  Set up your furniture so you have a clear path. Avoid moving your furniture around.  If any of your floors are uneven, fix them.  If there are any pets around you, be aware of where they are.  Review your medicines with your doctor. Some medicines can make you feel dizzy. This can increase your chance of falling. Ask your doctor what other things that you can do to help prevent falls. This information is not intended to replace advice given to you by your health care provider. Make sure you discuss any questions you have with your health care provider. Document Released: 02/11/2009 Document Revised: 09/23/2015 Document Reviewed: 05/22/2014 Elsevier Interactive Patient Education  2017 [employer].

## 2019-09-30 NOTE — Progress Notes (Signed)
Subjective:   Austin Stewart is a 64 y.o. male who presents for an Initial Medicare Annual Wellness Visit.  I connected with Dat today by telephone and verified that I am speaking with the correct person using two identifiers. Location patient: home Location provider: work Persons participating in the virtual visit: patient, LPN.   I discussed the limitations, risks, security and privacy concerns of performing an evaluation and management service by telephone and the availability of in person appointments. I also discussed with the patient that there may be a patient responsible charge related to this service. The patient expressed understanding and verbally consented to this telephonic visit.    Interactive audio and video telecommunications were attempted between this provider and patient, however failed, due to patient having technical difficulties OR patient did not have access to video capability.  We continued and completed visit with audio only.  Some vital signs may be absent or patient reported.   Time Spent with patient on telephone encounter: 15  minutes  Review of Systems   Cardiac Risk Factors include: advanced age (>25men, >62 women);male gender;hypertension;dyslipidemia    Objective:    Today's Vitals   09/30/19 0921 09/30/19 0940  Weight: 182 lb (82.6 kg)   Height: 5\' 10"  (1.778 m)   PainSc:  2    Body mass index is 26.11 kg/m.  Advanced Directives 09/30/2019 07/29/2019 02/12/2019  Does Patient Have a Medical Advance Directive? Yes Yes Yes  Type of 02/14/2019 of [place];Living will Healthcare Power of [place];Living will Healthcare Power of [place];Living will;Out of facility DNR (pink MOST or yellow form)  Does patient want to make changes to medical advance directive? - - No - Patient declined  Copy of Healthcare Power of Attorney in Chart? Yes - validated most recent copy scanned in chart (See row information) Yes - validated most  recent copy scanned in chart (See row information) -  Some encounter information is confidential and restricted. Go to Review Flowsheets activity to see all data.    Current Medications (verified) Outpatient Encounter Medications as of 09/30/2019  Medication Sig  . aspirin EC 81 MG tablet Take 1 tablet (81 mg total) by mouth daily.  . chlorhexidine (PERIDEX) 0.12 % solution   . esomeprazole (NEXIUM) 20 MG capsule TAKE 1 CAPSULE (20 MG TOTAL) BY MOUTH DAILY BEFORE BREAKFAST  **REPLACES OMEPRAZOLE** (Patient taking differently: Take 20 mg by mouth daily before breakfast. )  . flecainide (TAMBOCOR) 50 MG tablet TAKE 1 TABLET TWICE DAILY  . fluticasone (FLONASE) 50 MCG/ACT nasal spray PLACE 2 SPRAYS INTO BOTH NOSTRILS DAILY (Patient taking differently: Place 2 sprays into both nostrils daily as needed for allergies. )  . levocetirizine (XYZAL) 5 MG tablet TAKE 1 TABLET EVERY EVENING (Patient taking differently: Take 5 mg by mouth every evening. )  . meloxicam (MOBIC) 15 MG tablet TAKE 1 TABLET (15 MG TOTAL) BY MOUTH DAILY AS NEEDED FOR PAIN. UP TO 1-2 WEEKS MAX THEN REDUCE  . metoprolol succinate (TOPROL-XL) 25 MG 24 hr tablet Take 0.5 tablets (12.5 mg total) by mouth daily.  . Multiple Vitamins-Minerals (MULTIVITAMIN WITH MINERALS) tablet Take 1 tablet by mouth daily.  11/30/2019 OVER THE COUNTER MEDICATION Place 20 drops under the tongue 2 (two) times daily. CBD Oil  . rosuvastatin (CRESTOR) 5 MG tablet TAKE 1 TABLET EVERY OTHER DAY  . Turmeric 500 MG CAPS Take 500 mg by mouth 2 (two) times daily.   . [DISCONTINUED] amoxicillin (AMOXIL) 500 MG capsule  No facility-administered encounter medications on file as of 09/30/2019.    Allergies (verified) Peach [prunus persica]   History: Past Medical History:  Diagnosis Date  . Allergic rhinitis, cause unspecified   . Allergy   . Diverticulosis of colon (without mention of hemorrhage)   . Esophageal reflux   . Measles without mention of complication     . Mumps without mention of complication   . 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 Paroxysmal SVT (supraventricular tachycardia) (HCC)    Past Surgical History:  Procedure Laterality Date  . CARDIAC CATHETERIZATION  2006   [facility]   . CHOLECYSTECTOMY N/A 08/01/2019   Procedure: LAPAROSCOPIC CHOLECYSTECTOMY;  Surgeon: [name], MD;  Location: [facility] ORS;  Service: General;  Laterality: N/A;  . FOOT SURGERY  03/2017   right great toe Dr.Hyatt   . GREEN LIGHT LASER TURP (TRANSURETHRAL RESECTION OF PROSTATE N/A 01/15/2018   Procedure: GREEN LIGHT LASER TURP (TRANSURETHRAL RESECTION OF PROSTATE;  Surgeon: Orson Ape, MD;  Location: [facility] ORS;  Service: Urology;  Laterality: N/A;  . HERNIA REPAIR    . MITRAL VALVE REPAIR  2006  . rotator cuff repair    . SHOULDER ARTHROSCOPY Left 08/24/2016   Procedure: ARTHROSCOPY SHOULDER with debridement;  Surgeon: Juanell Fairly, MD;  Location: [facility] ORS;  Service: Orthopedics;  Laterality: Left;  . TONSILLECTOMY     Family History  Problem Relation Age of Onset  . Heart disease Mother        s/p stent placement  . Hypertension Mother   . Gallbladder disease Mother   . Congestive Heart Failure Mother   . Emphysema Father   . Dementia Father   . Osteoporosis Father   . Cancer Maternal Uncle        kidney cancer   Social History   Socioeconomic History  . Marital status: Married    Spouse name: Not on file  . Number of children: Not on file  . Years of education: Not on file  . Highest education level: Not on file  Occupational History  . Not on file  Tobacco Use  . Smoking status: Never Smoker  . Smokeless tobacco: Never Used  Substance and Sexual Activity  . Alcohol use: No  . Drug use: No  . Sexual activity: Not on file  Other Topics Concern  . Not on file  Social History Narrative  . Not on file   Social Determinants of Health   Financial Resource Strain:   . Difficulty of  Paying Living Expenses:   Food Insecurity:   . Worried About Programme researcher, broadcasting/film/video in the Last Year:   . Barista in the Last Year:   Transportation Needs:   . Freight forwarder (Medical):   Marland Kitchen Lack of Transportation (Non-Medical):   Physical Activity:   . Days of Exercise per Week:   . Minutes of Exercise per Session:   Stress:   . Feeling of Stress :   Social Connections:   . Frequency of Communication with Friends and Family:   . Frequency of Social Gatherings with Friends and Family:   . Attends Religious Services:   . Active Member of Clubs or Organizations:   . Attends Banker Meetings:   Marland Kitchen Marital Status:    Tobacco Counseling Counseling given: Not Answered   Clinical Intake:  Pre-visit preparation completed: Yes  Pain : 0-10 Pain Score: 2  Pain Type: Acute  pain Pain Location: Shoulder Pain Orientation: Right Pain Descriptors / Indicators: Sharp Pain Onset: In the past 7 days Pain Frequency: Intermittent Pain Relieving Factors: meloxicam. [facility] up at [facility] today.  Pain Relieving Factors: meloxicam. [facility] at [facility] today.  Nutritional Status: BMI 25 -29 Overweight Nutritional Risks: None Diabetes: No  How often do you need to have someone help you when you read instructions, pamphlets, or other written materials from your doctor or pharmacy?: 1 - Never  Interpreter Needed?: No  Information entered by :: Thomasenia Sales LPN  Activities of Daily Living In your present state of health, do you have any difficulty performing the following activities: 09/30/2019 07/29/2019  Hearing? N N  Comment No hearing aids -  Vision? N N  Comment [facility] -  Difficulty concentrating or making decisions? N N  Walking or climbing stairs? N N  Dressing or bathing? N N  Doing errands, shopping? N N  Preparing Food and eating ? N -  Using the Toilet? N -  In the past six months, have you accidently leaked urine? N -  Do you have problems  with loss of bowel control? N -  Managing your Medications? N -  Managing your Finances? N -  Housekeeping or managing your Housekeeping? N -  Some recent data might be hidden     Immunizations and Health Maintenance Immunization History  Administered Date(s) Administered  . Fluad Quad(high Dose 65+) 01/31/2019  . Influenza,inj,Quad PF,6+ Mos 02/01/2016  . Influenza-Unspecified 02/08/2017, 03/04/2018  . Moderna SARS-COVID-2 Vaccination 05/09/2019, 06/10/2019  . Pneumococcal Conjugate-13 08/05/2019  . Zoster Recombinat (Shingrix) 07/17/2018, 10/08/2018   There are no preventive care reminders to display for this patient.  Patient Care Team: Smitty Cords, DO as PCP - General (Family Medicine) [name], MD as PCP - Cardiology (Cardiology)  Indicate any recent Medical Services you may have received from other than [facility] providers in the past year (date may be approximate).    Assessment:   This is a routine wellness examination for Austin Stewart.  Hearing/Vision screen  Hearing Screening   125Hz  250Hz  500Hz  1000Hz  2000Hz  3000Hz  4000Hz  6000Hz  8000Hz   Right ear:           Left ear:           Vision Screening Comments: [facility]  Dietary issues and exercise activities discussed: Current Exercise Habits: Home exercise routine, Type of exercise: walking, Time (Minutes): 30, Frequency (Times/Week): 3, Weekly Exercise (Minutes/Week): 90, Intensity: Mild, Exercise limited by: None identified  Goals Addressed   None    Depression Screen PHQ 2/9 Scores 09/30/2019 09/30/2019 08/05/2019 02/18/2019  PHQ - 2 Score 0 0 0 1  PHQ- 9 Score - - - 1  Some encounter information is confidential and restricted. Go to Review Flowsheets activity to see all data.    Fall Risk Fall Risk  09/30/2019 08/19/2019 08/05/2019 07/24/2019 02/18/2019  Falls in the past year? 0 0 0 0 0  Number falls in past yr: 0 0 0 0 -  Injury with Fall? 0 0 0 0 -  Risk for fall due to : - - - - -  Follow up - -  Falls evaluation completed - -  Some encounter information is confidential and restricted. Go to Review Flowsheets activity to see all data.    FALL RISK PREVENTION PERTAINING TO THE HOME:  Any stairs in or around the home? Yes If so, are there any without handrails? No   Home free  of loose throw rugs in walkways, pet beds, electrical cords, etc? Yes  Adequate lighting in your home to reduce risk of falls? Yes   ASSISTIVE DEVICES UTILIZED TO PREVENT FALLS:  Life alert? No  Use of a cane, walker or w/c? No  Grab bars in the bathroom? No  Shower chair or bench in shower? No  Elevated toilet seat or a handicapped toilet? No    TIMED UP AND GO:  Was the test performed? No  Virtual visit.      Cognitive Function:        Screening Tests Health Maintenance  Topic Date Due  . TETANUS/TDAP  09/29/2020 (Originally 05/02/2019)  . INFLUENZA VACCINE  11/30/2019  . PNA vac Low Risk Adult (2 of 2 - PPSV23) 08/04/2020  . Fecal DNA (Cologuard)  08/03/2021  . COVID-19 Vaccine  Completed  . Hepatitis C Screening  Completed  . HIV Screening  Completed    Qualifies for Shingles Vaccine? No   Shingrix completed 10/08/18.   Tdap: Although this vaccine is not a covered service during a Wellness Exam .  Education has been provided regarding the importance of this vaccine. Advised may receive this vaccine at local pharmacy or Health Dept. Aware to provide a copy of the vaccination record if obtained from local pharmacy or Health Dept. Verbalized acceptance and understanding.  Flu Vaccine: Up to date  Pneumococcal Vaccine: Due for Pneumococcal vaccine 07/31/20   Covid-19 Vaccine:  Completed vaccines 06/10/19  Cancer Screenings:  Colorectal Screening: Completed 08/04/18-Cologuard. Repeat every 3 years.  Lung Cancer Screening: (Low Dose CT Chest recommended if Age 84-80 years, 30 pack-year currently smoking OR have quit w/in 15years.) does not qualify.    Additional Screening:  Hepatitis C  Screening: Completed 10/26/16  Vision Screening: Recommended annual ophthalmology exams for early detection of glaucoma and other disorders of the eye. Is the patient up to date with their annual eye exam?  Yes  Who is the provider or what is the name of the office in which the pt attends annual eye exams? [facility]   Dental Screening: Recommended annual dental exams for proper oral hygiene  Community Resource Referral:  CRR required this visit?  No        Plan:  I have personally reviewed and addressed the Medicare Annual Wellness questionnaire and have noted the following in the patient's chart:  A. Medical and social history B. Use of alcohol, tobacco or illicit drugs  C. Current medications and supplements D. Functional ability and status E.  Nutritional status F.  Physical activity G. Advance directives H. List of other physicians I.  Hospitalizations, surgeries, and ER visits in previous 12 months J.  [place] such as hearing and vision if needed, cognitive and depression L. Referrals and appointments   In addition, I have reviewed and discussed with patient certain preventive protocols, quality metrics, and best practice recommendations. A written personalized care plan for preventive services as well as general preventive health recommendations were provided to patient.  AVS offered to patient via my chart.  Signed,    Marta Antu, LPN   12/02/6960  Nurse Health Advisor   Nurse Notes: Patient requested refill on Flonase to [facility].

## 2019-10-04 DIAGNOSIS — M19011 Primary osteoarthritis, right shoulder: Secondary | ICD-10-CM | POA: Diagnosis not present

## 2019-10-04 DIAGNOSIS — M25411 Effusion, right shoulder: Secondary | ICD-10-CM | POA: Diagnosis not present

## 2019-10-04 DIAGNOSIS — M75121 Complete rotator cuff tear or rupture of right shoulder, not specified as traumatic: Secondary | ICD-10-CM | POA: Diagnosis not present

## 2019-10-04 DIAGNOSIS — M85611 Other cyst of bone, right shoulder: Secondary | ICD-10-CM | POA: Diagnosis not present

## 2019-10-04 DIAGNOSIS — S43401A Unspecified sprain of right shoulder joint, initial encounter: Secondary | ICD-10-CM | POA: Diagnosis not present

## 2019-10-04 DIAGNOSIS — S43021A Posterior subluxation of right humerus, initial encounter: Secondary | ICD-10-CM | POA: Diagnosis not present

## 2019-11-10 NOTE — Progress Notes (Signed)
Cardiology Office Note  Date:  11/11/2019   ID:  Austin Stewart, DOB 03-14-1956, MRN [id number]  PCP:  Austin Cords, DO   Chief Complaint  Patient presents with  . office visit    Pt has no complaints. Meds verbally reviewed w/ pt.    HPI:  Austin Stewart is a 64 year old gentleman, history of  significant mitral valve regurgitation (torn leaflet? by his report) with associated shortness of breath,  mitral valve repair in 2006 at [facility] with maze procedure  shoulder surgery March 2013 canceled secondary to arrhythmia, likely PACs with followup echocardiogram and stress test showing no ischemia by report, shoulder surgery April 2013  taking flecainide presumably for ectopy. This was started by prior cardiologist. who presents for routine followup of his arrhythmia  Prior studies reviewed CT scan March 2017 Coronary calcium score of 130.   Chronic shortness of breath  Work-up for shortness of breath in the past including  echocardiogram and stress test  Echo 10/17/2018, reviewed  1. The left ventricle has normal systolic function with an ejection fraction of 60-65%.   2. The right ventricle normal systolic function.  Right ventricular systolic pressure 34.4 mmHg.  3.  Annuloplasty valve is present in the mitral position. Procedure Date: 2006.  4.  Mitral valve regurgitation is moderate  5. Mildly dilated Aortic root, 3.8 cm  Low risk stress test 10/03/2018, reviewed Testing results reviewed with him in detail  He is now retired 07/10/2018 Mother passed away, breast ca, CHF Does wood shop, for hobby Moved to [place], since June 2021  Tore right rotator cuff, [facility] Had MRI Slow improvemnt in movement Might need surgery  EKG personally reviewed by myself on todays visit NSR rate 62 bpm, no ST or T wave changes, no change  Other past medical history reviewed Event monitor reviewed with him showing rare episodes of SVT ranging from 4 beats up to 19  beats, 12 episodes in total over 2 week.  echocardiogram in 2013 which showed well functioning mitral valve.  Holter monitor in 2013. He does not know what that showed.   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  PSH:    Past Surgical History:  Procedure Laterality Date  . CARDIAC CATHETERIZATION  2006   [facility]   . CHOLECYSTECTOMY N/A 08/01/2019   Procedure: LAPAROSCOPIC CHOLECYSTECTOMY;  Surgeon: [name], MD;  Location: [facility] ORS;  Service: General;  Laterality: N/A;  . FOOT SURGERY  03/2017   right great toe Dr.Hyatt   . GREEN LIGHT LASER TURP (TRANSURETHRAL RESECTION OF PROSTATE N/A 01/15/2018   Procedure: GREEN LIGHT LASER TURP (TRANSURETHRAL RESECTION OF PROSTATE;  Surgeon: Orson Ape, MD;  Location: [facility] ORS;  Service: Urology;  Laterality: N/A;  . HERNIA REPAIR    . MITRAL VALVE REPAIR  2006  . rotator cuff repair    . SHOULDER ARTHROSCOPY Left 08/24/2016   Procedure: ARTHROSCOPY SHOULDER with debridement;  Surgeon: Juanell Fairly, MD;  Location: [facility] ORS;  Service: Orthopedics;  Laterality: Left;  . TONSILLECTOMY      Current Outpatient Medications  Medication Sig Dispense Refill  . aspirin EC 81 MG tablet Take 1 tablet (81 mg total)  by mouth daily. 90 tablet 3  . esomeprazole (NEXIUM) 20 MG capsule TAKE 1 CAPSULE (20 MG TOTAL) BY MOUTH DAILY BEFORE BREAKFAST  **REPLACES OMEPRAZOLE** (Patient taking differently: Take 20 mg by mouth daily before breakfast. ) 90 capsule 1  . flecainide (TAMBOCOR) 50 MG tablet TAKE 1 TABLET TWICE DAILY 180 tablet 3  . fluticasone (FLONASE) 50 MCG/ACT  nasal spray PLACE 2 SPRAYS INTO BOTH NOSTRILS DAILY 48 g 3  . levocetirizine (XYZAL) 5 MG tablet TAKE 1 TABLET EVERY EVENING (Patient taking differently: Take 5 mg by mouth every evening. ) 90 tablet 3  . meloxicam (MOBIC) 15 MG tablet TAKE 1 TABLET (15 MG TOTAL) BY MOUTH DAILY AS NEEDED FOR PAIN. UP TO 1-2 WEEKS MAX THEN REDUCE 90 tablet 1  . metoprolol succinate (TOPROL-XL) 25 MG 24 hr tablet Take 0.5 tablets (12.5 mg total) by mouth daily. 45 tablet 3  . Multiple Vitamins-Minerals (MULTIVITAMIN WITH MINERALS) tablet Take 1 tablet by mouth daily.    Marland Kitchen OVER THE COUNTER MEDICATION Place 20 drops under the tongue 2 (two) times daily. CBD Oil    . rosuvastatin (CRESTOR) 5 MG tablet TAKE 1 TABLET EVERY OTHER DAY 45 tablet 3  . Turmeric 500 MG CAPS Take 500 mg by mouth 2 (two) times daily.      No current facility-administered medications for this visit.    Allergies:   Peach [prunus persica]   Social History:  The patient  reports that he has never smoked. He has never used smokeless tobacco. He reports that he does not drink alcohol and does not use drugs.   Family History:   family history includes Cancer in his maternal uncle; Congestive Heart Failure in his mother; Dementia in his father; Emphysema in his father; Gallbladder disease in his mother; Heart disease in his mother; Hypertension in his mother; Osteoporosis in his father.   Review of Systems: Review of Systems  Constitutional: Negative.   HENT: Negative.   Respiratory: Positive for shortness of breath.   Cardiovascular: Negative.   Gastrointestinal: Negative.   Musculoskeletal: Negative.   Neurological: Negative.   Psychiatric/Behavioral: Negative.   All other systems reviewed and are negative.   PHYSICAL EXAM: VS:  BP 134/86 (BP Location: Left Arm, Patient Position: Sitting, Cuff Size: Normal)   Pulse 62   Ht 5\' 10"  (1.778 m)   Wt 184 lb 4 oz (83.6 kg)   SpO2 98%   BMI 26.44 kg/m  , BMI Body mass index is 26.44  kg/m. Constitutional:  oriented to person, place, and time. No distress.  HENT:  Head: Grossly normal Eyes:  no discharge. No scleral icterus.  Neck: No JVD, no carotid bruits  Cardiovascular: Regular rate and rhythm, no murmurs appreciated Pulmonary/Chest: Clear to auscultation bilaterally, no wheezes or rails Abdominal: Soft.  no distension.  no tenderness.  Musculoskeletal: Normal range of motion Neurological:  normal muscle tone. Coordination normal. No atrophy Skin: Skin warm and dry Psychiatric: normal affect, pleasant  Recent Labs: 07/29/2019: ALT 24 07/30/2019: BUN 23; Creatinine, Ser 1.11; Hemoglobin 14.2; Platelets 351; Potassium 4.2; Sodium 142    Lipid Panel Lab Results  Component Value Date   CHOL 98 07/29/2019   HDL 41 07/29/2019   LDLCALC 39 07/29/2019   TRIG 92 07/29/2019      Wt Readings from Last 3 Encounters:  11/11/19 184 lb 4 oz (83.6 kg)  09/30/19 182 lb (82.6 kg)  08/19/19 188 lb (85.3 kg)     ASSESSMENT AND PLAN:  Diastolic congestive heart failure, unspecified congestive heart failure chronicity (HCC) - Plan: EKG 12-Lead  normal right heart pressures on echocardiogram Appears euvolemic on today's visit, No further work-up or shortness of breath, symptoms stable Recommend regular walking program  H/O mitral valve repair - Plan: EKG 12-Lead Moderate mitral valve regurgitation on echocardiogram 2020 Surprisingly no significant murmur on today's exam No strong indication for repeat echocardiogram, no change in symptoms, recommend regular walking program Could repeat echocardiogram for any worsening symptoms Plan discussed in detail  SVT/Heart palpitations - Plan: EKG 12-Lead Taking  flecainide  Refill provided, has had no significant change in symptoms  Elevated lipids Excellent numbers, reviewed on today's visit Continue statin  Prediabetes  A1c well controlled, Active in [place], recommend regular exercise program  Shortness of  breath Recommend regular exercise program for conditioning Symptoms have been stable, no further work-up needed Has had previous echo and stress test mitral valve regurgitation may be contributing   Total encounter time more than 25 minutes  Greater than 50% was spent in counseling and coordination of care with the patient    Orders Placed This Encounter  Procedures  . EKG 12-Lead     Signed, Dossie Arbour, M.D., Ph.D. 11/11/2019  [facility] [place], [place] [phone]

## 2019-11-11 ENCOUNTER — Encounter: Payer: Self-pay | Admitting: Cardiovascular Disease

## 2019-11-11 ENCOUNTER — Other Ambulatory Visit: Payer: Self-pay

## 2019-11-11 ENCOUNTER — Ambulatory Visit (INDEPENDENT_AMBULATORY_CARE_PROVIDER_SITE_OTHER): Payer: Medicare PPO | Admitting: Cardiovascular Disease

## 2019-11-11 VITALS — BP 134/86 | HR 62 | Ht 70.0 in | Wt 184.2 lb

## 2019-11-11 DIAGNOSIS — Z9889 Other specified postprocedural states: Secondary | ICD-10-CM

## 2019-11-11 DIAGNOSIS — I471 Supraventricular tachycardia: Secondary | ICD-10-CM | POA: Diagnosis not present

## 2019-11-11 DIAGNOSIS — I059 Rheumatic mitral valve disease, unspecified: Secondary | ICD-10-CM | POA: Diagnosis not present

## 2019-11-11 DIAGNOSIS — E782 Mixed hyperlipidemia: Secondary | ICD-10-CM

## 2019-11-11 DIAGNOSIS — R0602 Shortness of breath: Secondary | ICD-10-CM

## 2019-11-11 DIAGNOSIS — I5032 Chronic diastolic (congestive) heart failure: Secondary | ICD-10-CM | POA: Diagnosis not present

## 2019-11-11 MED ORDER — ROSUVASTATIN CALCIUM 5 MG PO TABS: 5.0000 mg | ORAL_TABLET | ORAL | 3 refills | Status: AC

## 2019-11-11 MED ORDER — METOPROLOL SUCCINATE ER 25 MG PO TB24: 12.5000 mg | ORAL_TABLET | Freq: Every day | ORAL | 3 refills | Status: AC

## 2019-11-11 MED ORDER — FLECAINIDE ACETATE 50 MG PO TABS: 50.0000 mg | ORAL_TABLET | Freq: Two times a day (BID) | ORAL | 3 refills | Status: AC

## 2019-11-11 NOTE — Addendum Note (Signed)
Addended by: Bryna Colander on: 11/11/2019 02:38 PM   Modules accepted: Orders

## 2019-11-11 NOTE — Patient Instructions (Signed)

## 2019-11-18 DIAGNOSIS — J302 Other seasonal allergic rhinitis: Secondary | ICD-10-CM | POA: Diagnosis not present

## 2019-11-18 DIAGNOSIS — I5030 Unspecified diastolic (congestive) heart failure: Secondary | ICD-10-CM | POA: Diagnosis not present

## 2019-11-18 DIAGNOSIS — Z9229 Personal history of other drug therapy: Secondary | ICD-10-CM | POA: Diagnosis not present

## 2019-11-18 DIAGNOSIS — I059 Rheumatic mitral valve disease, unspecified: Secondary | ICD-10-CM | POA: Diagnosis not present

## 2019-12-05 DIAGNOSIS — J019 Acute sinusitis, unspecified: Secondary | ICD-10-CM | POA: Diagnosis not present

## 2019-12-05 DIAGNOSIS — Z1159 Encounter for screening for other viral diseases: Secondary | ICD-10-CM | POA: Diagnosis not present

## 2019-12-10 DIAGNOSIS — S46012A Strain of muscle(s) and tendon(s) of the rotator cuff of left shoulder, initial encounter: Secondary | ICD-10-CM | POA: Diagnosis not present

## 2019-12-10 DIAGNOSIS — M7541 Impingement syndrome of right shoulder: Secondary | ICD-10-CM | POA: Diagnosis not present

## 2019-12-10 DIAGNOSIS — M7542 Impingement syndrome of left shoulder: Secondary | ICD-10-CM | POA: Diagnosis not present

## 2019-12-10 DIAGNOSIS — X500XXA Overexertion from strenuous movement or load, initial encounter: Secondary | ICD-10-CM | POA: Diagnosis not present

## 2019-12-10 DIAGNOSIS — S46212A Strain of muscle, fascia and tendon of other parts of biceps, left arm, initial encounter: Secondary | ICD-10-CM | POA: Diagnosis not present

## 2019-12-10 DIAGNOSIS — S46011A Strain of muscle(s) and tendon(s) of the rotator cuff of right shoulder, initial encounter: Secondary | ICD-10-CM | POA: Diagnosis not present

## 2019-12-11 ENCOUNTER — Telehealth: Payer: Self-pay | Admitting: Cardiovascular Disease

## 2019-12-11 NOTE — Telephone Encounter (Signed)
° °  [facility] Pre-operative Risk Assessment    HEARTCARE STAFF: - Please ensure there is not already an duplicate clearance open for this procedure. - Under Visit Info/Reason for Call, type in Other and utilize the format Clearance MM/DD/YY or Clearance TBD. Do not use dashes or single digits. - If request is for dental extraction, please clarify the # of teeth to be extracted.  Request for surgical clearance:  1. What type of surgery is being performed? Right shoulder arthroscopy, mini-open rotator cuff reconstruction/superior capsule reconstruction with Arthroflex, arthroscopic subscapularis repair, acromioclavicular joint resection, subacromial bursectomy, CA ligament debridement, biceps tenodesis and synovectomy.   2. When is this surgery scheduled? 01/02/20  3. What type of clearance is required (medical clearance vs. Pharmacy clearance to hold med vs. Both)? both  4. Are there any medications that need to be held prior to surgery and how long?not listed, please advise if needed   5. Practice name and name of physician performing surgery? Dr Theressa Millard. [facility]   6. What is the office phone number? [phone]   7.   What is the office fax number? [phone]  8.   Anesthesia type (None, local, MAC, general) ? Not listed    Ace Gins 12/11/2019, 3:03 PM  _________________________________________________________________   (provider comments below)

## 2019-12-11 NOTE — Telephone Encounter (Signed)
Dr Mariah Milling we need clearance to hold aspirin in this patient pre op shoulder surgery. You saw him 7/13. He has a history of MV repair and PSVT on Flecainide, no CAD that I could find.  Please respond to  CV DIV PRE OP  Thanks  Corine Shelter PA-C 12/11/2019 3:46 PM

## 2019-12-16 NOTE — Telephone Encounter (Signed)
Dr Mariah Milling  Please see previous note from L. Kilroy, PA-C We need clearance to hold aspirin in this patient pre op shoulder surgery. You saw him 7/13. He has a history of MV repair and PSVT on Flecainide, no CAD that I could find.   Please respond to  CV DIV PRE OP  Tereso Newcomer, PA-C    12/16/2019 9:57 AM

## 2019-12-16 NOTE — Telephone Encounter (Signed)
Okay to hold aspirin 5 days prior to surgery if needed by surgeon Otherwise no further testing needed

## 2019-12-17 DIAGNOSIS — M549 Dorsalgia, unspecified: Secondary | ICD-10-CM | POA: Diagnosis not present

## 2019-12-17 DIAGNOSIS — I10 Essential (primary) hypertension: Secondary | ICD-10-CM | POA: Diagnosis not present

## 2019-12-17 NOTE — Telephone Encounter (Signed)
   Primary Cardiologist: Julien Nordmann, MD  Chart reviewed as part of pre-operative protocol coverage. Given past medical history and time since last visit, based on ACC/AHA guidelines, Austin Stewart would be at acceptable risk for the planned procedure without further cardiovascular testing.   He may hold his aspirin for 5 days prior to surgery.  Please resume as soon as hemostasis is achieved.  I will route this recommendation to the requesting party via Epic fax function and remove from pre-op pool.  Please call with questions.  Thomasene Ripple. Cathryne Mancebo NP-C    12/17/2019, 7:34 AM [facility] [address] Office [phone] Fax [phone]

## 2019-12-30 DIAGNOSIS — M545 Low back pain: Secondary | ICD-10-CM | POA: Diagnosis not present

## 2019-12-30 DIAGNOSIS — I10 Essential (primary) hypertension: Secondary | ICD-10-CM | POA: Diagnosis not present

## 2020-01-01 DIAGNOSIS — Z20822 Contact with and (suspected) exposure to covid-19: Secondary | ICD-10-CM | POA: Diagnosis not present

## 2020-01-01 DIAGNOSIS — M7542 Impingement syndrome of left shoulder: Secondary | ICD-10-CM | POA: Diagnosis not present

## 2020-01-02 DIAGNOSIS — M7542 Impingement syndrome of left shoulder: Secondary | ICD-10-CM | POA: Diagnosis not present

## 2020-01-02 DIAGNOSIS — M66321 Spontaneous rupture of flexor tendons, right upper arm: Secondary | ICD-10-CM | POA: Diagnosis not present

## 2020-01-02 DIAGNOSIS — M7551 Bursitis of right shoulder: Secondary | ICD-10-CM | POA: Diagnosis not present

## 2020-01-02 DIAGNOSIS — K219 Gastro-esophageal reflux disease without esophagitis: Secondary | ICD-10-CM | POA: Diagnosis not present

## 2020-01-02 DIAGNOSIS — S46211A Strain of muscle, fascia and tendon of other parts of biceps, right arm, initial encounter: Secondary | ICD-10-CM | POA: Diagnosis not present

## 2020-01-02 DIAGNOSIS — M7541 Impingement syndrome of right shoulder: Secondary | ICD-10-CM | POA: Diagnosis not present

## 2020-01-02 DIAGNOSIS — M75121 Complete rotator cuff tear or rupture of right shoulder, not specified as traumatic: Secondary | ICD-10-CM | POA: Diagnosis not present

## 2020-01-02 DIAGNOSIS — M65811 Other synovitis and tenosynovitis, right shoulder: Secondary | ICD-10-CM | POA: Diagnosis not present

## 2020-01-02 DIAGNOSIS — M19042 Primary osteoarthritis, left hand: Secondary | ICD-10-CM | POA: Diagnosis not present

## 2020-01-02 DIAGNOSIS — M069 Rheumatoid arthritis, unspecified: Secondary | ICD-10-CM | POA: Diagnosis not present

## 2020-01-02 DIAGNOSIS — S46212A Strain of muscle, fascia and tendon of other parts of biceps, left arm, initial encounter: Secondary | ICD-10-CM | POA: Diagnosis not present

## 2020-01-02 DIAGNOSIS — I10 Essential (primary) hypertension: Secondary | ICD-10-CM | POA: Diagnosis not present

## 2020-01-02 DIAGNOSIS — M19041 Primary osteoarthritis, right hand: Secondary | ICD-10-CM | POA: Diagnosis not present

## 2020-01-02 DIAGNOSIS — S46012A Strain of muscle(s) and tendon(s) of the rotator cuff of left shoulder, initial encounter: Secondary | ICD-10-CM | POA: Diagnosis not present

## 2020-01-02 DIAGNOSIS — M25512 Pain in left shoulder: Secondary | ICD-10-CM | POA: Diagnosis not present

## 2020-01-02 DIAGNOSIS — S46011A Strain of muscle(s) and tendon(s) of the rotator cuff of right shoulder, initial encounter: Secondary | ICD-10-CM | POA: Diagnosis not present

## 2020-01-02 DIAGNOSIS — M19011 Primary osteoarthritis, right shoulder: Secondary | ICD-10-CM | POA: Diagnosis not present

## 2020-01-03 DIAGNOSIS — I10 Essential (primary) hypertension: Secondary | ICD-10-CM | POA: Diagnosis not present

## 2020-01-03 DIAGNOSIS — M19011 Primary osteoarthritis, right shoulder: Secondary | ICD-10-CM | POA: Diagnosis not present

## 2020-01-03 DIAGNOSIS — M75121 Complete rotator cuff tear or rupture of right shoulder, not specified as traumatic: Secondary | ICD-10-CM | POA: Diagnosis not present

## 2020-01-03 DIAGNOSIS — K219 Gastro-esophageal reflux disease without esophagitis: Secondary | ICD-10-CM | POA: Diagnosis not present

## 2020-01-03 DIAGNOSIS — M19042 Primary osteoarthritis, left hand: Secondary | ICD-10-CM | POA: Diagnosis not present

## 2020-01-03 DIAGNOSIS — M7551 Bursitis of right shoulder: Secondary | ICD-10-CM | POA: Diagnosis not present

## 2020-01-03 DIAGNOSIS — M19041 Primary osteoarthritis, right hand: Secondary | ICD-10-CM | POA: Diagnosis not present

## 2020-01-03 DIAGNOSIS — M069 Rheumatoid arthritis, unspecified: Secondary | ICD-10-CM | POA: Diagnosis not present

## 2020-01-03 DIAGNOSIS — S46211A Strain of muscle, fascia and tendon of other parts of biceps, right arm, initial encounter: Secondary | ICD-10-CM | POA: Diagnosis not present

## 2020-01-06 DIAGNOSIS — M25611 Stiffness of right shoulder, not elsewhere classified: Secondary | ICD-10-CM | POA: Diagnosis not present

## 2020-01-08 DIAGNOSIS — M25611 Stiffness of right shoulder, not elsewhere classified: Secondary | ICD-10-CM | POA: Diagnosis not present

## 2020-01-13 DIAGNOSIS — M25611 Stiffness of right shoulder, not elsewhere classified: Secondary | ICD-10-CM | POA: Diagnosis not present

## 2020-01-15 DIAGNOSIS — M25611 Stiffness of right shoulder, not elsewhere classified: Secondary | ICD-10-CM | POA: Diagnosis not present

## 2020-01-20 DIAGNOSIS — M25611 Stiffness of right shoulder, not elsewhere classified: Secondary | ICD-10-CM | POA: Diagnosis not present

## 2020-01-22 DIAGNOSIS — M25611 Stiffness of right shoulder, not elsewhere classified: Secondary | ICD-10-CM | POA: Diagnosis not present

## 2020-01-27 DIAGNOSIS — M25611 Stiffness of right shoulder, not elsewhere classified: Secondary | ICD-10-CM | POA: Diagnosis not present

## 2020-01-29 DIAGNOSIS — M25611 Stiffness of right shoulder, not elsewhere classified: Secondary | ICD-10-CM | POA: Diagnosis not present

## 2020-02-03 DIAGNOSIS — M25611 Stiffness of right shoulder, not elsewhere classified: Secondary | ICD-10-CM | POA: Diagnosis not present

## 2020-02-05 DIAGNOSIS — M25611 Stiffness of right shoulder, not elsewhere classified: Secondary | ICD-10-CM | POA: Diagnosis not present

## 2020-02-10 DIAGNOSIS — M25611 Stiffness of right shoulder, not elsewhere classified: Secondary | ICD-10-CM | POA: Diagnosis not present

## 2020-02-12 DIAGNOSIS — M25611 Stiffness of right shoulder, not elsewhere classified: Secondary | ICD-10-CM | POA: Diagnosis not present

## 2020-02-16 DIAGNOSIS — M25611 Stiffness of right shoulder, not elsewhere classified: Secondary | ICD-10-CM | POA: Diagnosis not present

## 2020-02-19 DIAGNOSIS — M25611 Stiffness of right shoulder, not elsewhere classified: Secondary | ICD-10-CM | POA: Diagnosis not present

## 2020-02-23 DIAGNOSIS — M25611 Stiffness of right shoulder, not elsewhere classified: Secondary | ICD-10-CM | POA: Diagnosis not present

## 2020-02-26 DIAGNOSIS — M25611 Stiffness of right shoulder, not elsewhere classified: Secondary | ICD-10-CM | POA: Diagnosis not present

## 2020-03-02 DIAGNOSIS — M25611 Stiffness of right shoulder, not elsewhere classified: Secondary | ICD-10-CM | POA: Diagnosis not present

## 2020-03-04 DIAGNOSIS — M25611 Stiffness of right shoulder, not elsewhere classified: Secondary | ICD-10-CM | POA: Diagnosis not present

## 2020-03-09 DIAGNOSIS — M25611 Stiffness of right shoulder, not elsewhere classified: Secondary | ICD-10-CM | POA: Diagnosis not present

## 2020-03-11 DIAGNOSIS — M25611 Stiffness of right shoulder, not elsewhere classified: Secondary | ICD-10-CM | POA: Diagnosis not present

## 2020-03-13 DIAGNOSIS — J019 Acute sinusitis, unspecified: Secondary | ICD-10-CM | POA: Diagnosis not present

## 2020-03-16 DIAGNOSIS — M25611 Stiffness of right shoulder, not elsewhere classified: Secondary | ICD-10-CM | POA: Diagnosis not present

## 2020-03-18 DIAGNOSIS — M25611 Stiffness of right shoulder, not elsewhere classified: Secondary | ICD-10-CM | POA: Diagnosis not present

## 2020-03-22 DIAGNOSIS — M25611 Stiffness of right shoulder, not elsewhere classified: Secondary | ICD-10-CM | POA: Diagnosis not present

## 2020-03-29 DIAGNOSIS — M25611 Stiffness of right shoulder, not elsewhere classified: Secondary | ICD-10-CM | POA: Diagnosis not present

## 2020-03-31 DIAGNOSIS — Z9229 Personal history of other drug therapy: Secondary | ICD-10-CM | POA: Diagnosis not present

## 2020-03-31 DIAGNOSIS — I10 Essential (primary) hypertension: Secondary | ICD-10-CM | POA: Diagnosis not present

## 2020-03-31 DIAGNOSIS — Z789 Other specified health status: Secondary | ICD-10-CM | POA: Diagnosis not present

## 2020-04-05 DIAGNOSIS — M25611 Stiffness of right shoulder, not elsewhere classified: Secondary | ICD-10-CM | POA: Diagnosis not present

## 2020-04-12 DIAGNOSIS — M25611 Stiffness of right shoulder, not elsewhere classified: Secondary | ICD-10-CM | POA: Diagnosis not present

## 2020-04-19 DIAGNOSIS — M7541 Impingement syndrome of right shoulder: Secondary | ICD-10-CM | POA: Diagnosis not present

## 2020-04-19 DIAGNOSIS — M75121 Complete rotator cuff tear or rupture of right shoulder, not specified as traumatic: Secondary | ICD-10-CM | POA: Diagnosis not present

## 2020-04-19 DIAGNOSIS — M66321 Spontaneous rupture of flexor tendons, right upper arm: Secondary | ICD-10-CM | POA: Diagnosis not present

## 2020-04-19 DIAGNOSIS — M19011 Primary osteoarthritis, right shoulder: Secondary | ICD-10-CM | POA: Diagnosis not present

## 2020-04-20 DIAGNOSIS — M25611 Stiffness of right shoulder, not elsewhere classified: Secondary | ICD-10-CM | POA: Diagnosis not present

## 2020-04-21 DIAGNOSIS — T464X5A Adverse effect of angiotensin-converting-enzyme inhibitors, initial encounter: Secondary | ICD-10-CM | POA: Diagnosis not present

## 2020-04-21 DIAGNOSIS — R058 Other specified cough: Secondary | ICD-10-CM | POA: Diagnosis not present

## 2020-04-21 DIAGNOSIS — I10 Essential (primary) hypertension: Secondary | ICD-10-CM | POA: Diagnosis not present

## 2020-04-21 DIAGNOSIS — Z9889 Other specified postprocedural states: Secondary | ICD-10-CM | POA: Diagnosis not present

## 2020-05-07 DIAGNOSIS — M25611 Stiffness of right shoulder, not elsewhere classified: Secondary | ICD-10-CM | POA: Diagnosis not present

## 2020-05-11 DIAGNOSIS — M25611 Stiffness of right shoulder, not elsewhere classified: Secondary | ICD-10-CM | POA: Diagnosis not present

## 2020-05-12 DIAGNOSIS — U071 COVID-19: Secondary | ICD-10-CM | POA: Diagnosis not present

## 2020-05-18 DIAGNOSIS — M25611 Stiffness of right shoulder, not elsewhere classified: Secondary | ICD-10-CM | POA: Diagnosis not present

## 2020-05-26 DIAGNOSIS — M25611 Stiffness of right shoulder, not elsewhere classified: Secondary | ICD-10-CM | POA: Diagnosis not present

## 2020-06-02 DIAGNOSIS — M25611 Stiffness of right shoulder, not elsewhere classified: Secondary | ICD-10-CM | POA: Diagnosis not present

## 2020-06-09 DIAGNOSIS — M25611 Stiffness of right shoulder, not elsewhere classified: Secondary | ICD-10-CM | POA: Diagnosis not present

## 2020-06-16 DIAGNOSIS — M25611 Stiffness of right shoulder, not elsewhere classified: Secondary | ICD-10-CM | POA: Diagnosis not present

## 2020-06-23 DIAGNOSIS — M25611 Stiffness of right shoulder, not elsewhere classified: Secondary | ICD-10-CM | POA: Diagnosis not present

## 2020-06-28 DIAGNOSIS — M7541 Impingement syndrome of right shoulder: Secondary | ICD-10-CM | POA: Diagnosis not present

## 2020-06-28 DIAGNOSIS — M75121 Complete rotator cuff tear or rupture of right shoulder, not specified as traumatic: Secondary | ICD-10-CM | POA: Diagnosis not present

## 2020-06-28 DIAGNOSIS — M66321 Spontaneous rupture of flexor tendons, right upper arm: Secondary | ICD-10-CM | POA: Diagnosis not present

## 2020-06-28 DIAGNOSIS — M19011 Primary osteoarthritis, right shoulder: Secondary | ICD-10-CM | POA: Diagnosis not present

## 2021-02-15 ENCOUNTER — Encounter: Payer: Self-pay | Admitting: General Surgery

## 2021-03-31 ENCOUNTER — Other Ambulatory Visit: Payer: Self-pay | Admitting: Family Medicine

## 2021-03-31 DIAGNOSIS — J3089 Other allergic rhinitis: Secondary | ICD-10-CM

## 2021-04-02 NOTE — Telephone Encounter (Signed)
Last RF 09/30/19  48g 3 RF 4 months overdue for appt. Sent pt message  to call  office and make appt.  Requested Prescriptions  Pending Prescriptions Disp Refills   fluticasone (FLONASE) 50 MCG/ACT nasal spray [Pharmacy Med Name: FLUTICASONE PROP 50 MCG SPRAY] 48 mL 2    Sig: SPRAY 2 SPRAYS INTO EACH NOSTRIL EVERY DAY     Ear, Nose, and Throat: Nasal Preparations - Corticosteroids Failed - 03/31/2021 12:45 PM      Failed - Valid encounter within last 12 months    Recent Outpatient Visits           1 year ago Annual physical exam   [facility] Smitty Cords, DO   1 year ago Colicky RUQ abdominal pain   [facility] [place], Netta Neat, DO   2 years ago Chronic left hip pain   [facility] [place], Netta Neat, DO   2 years ago Chronic left hip pain   [facility] Smitty Cords, DO   2 years ago Annual physical exam   [facility] [place], Netta Neat, DO

## 2022-02-15 ENCOUNTER — Other Ambulatory Visit: Payer: Self-pay | Admitting: Family Medicine

## 2022-02-15 DIAGNOSIS — J3089 Other allergic rhinitis: Secondary | ICD-10-CM

## 2022-02-15 NOTE — Telephone Encounter (Signed)
pt moved to [place]. 10/19/20  Requested Prescriptions  Refused Prescriptions Disp Refills  . fluticasone (FLONASE) 50 MCG/ACT nasal spray [Pharmacy Med Name: FLUTICASONE PROP 50 MCG SPRAY] 48 mL 2    Sig: SPRAY 2 SPRAYS INTO EACH NOSTRIL EVERY DAY     Ear, Nose, and Throat: Nasal Preparations - Corticosteroids Failed - 02/15/2022  1:46 AM      Failed - Valid encounter within last 12 months    Recent Outpatient Visits          2 years ago Annual physical exam   [place], DO   2 years ago Colicky RUQ abdominal pain   [place], DO   2 years ago Chronic left hip pain   [place], DO   3 years ago Chronic left hip pain   [facility] Olin Hauser, DO   3 years ago Annual physical exam   [facility] [place], Devonne Doughty, DO
